# Patient Record
Sex: Male | Born: 1940
Health system: Southern US, Community
[De-identification: ages and names within clinical notes are randomized; demographics above are authoritative.]

## PROBLEM LIST (undated history)

## (undated) DIAGNOSIS — N400 Enlarged prostate without lower urinary tract symptoms: Secondary | ICD-10-CM

## (undated) DIAGNOSIS — M47812 Spondylosis without myelopathy or radiculopathy, cervical region: Secondary | ICD-10-CM

## (undated) DIAGNOSIS — E039 Hypothyroidism, unspecified: Secondary | ICD-10-CM

## (undated) DIAGNOSIS — K219 Gastro-esophageal reflux disease without esophagitis: Secondary | ICD-10-CM

## (undated) DIAGNOSIS — J45909 Unspecified asthma, uncomplicated: Secondary | ICD-10-CM

## (undated) DIAGNOSIS — E785 Hyperlipidemia, unspecified: Secondary | ICD-10-CM

## (undated) DIAGNOSIS — E079 Disorder of thyroid, unspecified: Secondary | ICD-10-CM

## (undated) DIAGNOSIS — Z8489 Family history of other specified conditions: Secondary | ICD-10-CM

## (undated) DIAGNOSIS — C801 Malignant (primary) neoplasm, unspecified: Secondary | ICD-10-CM

## (undated) DIAGNOSIS — N289 Disorder of kidney and ureter, unspecified: Secondary | ICD-10-CM

## (undated) DIAGNOSIS — I4891 Unspecified atrial fibrillation: Secondary | ICD-10-CM

## (undated) DIAGNOSIS — I499 Cardiac arrhythmia, unspecified: Secondary | ICD-10-CM

## (undated) DIAGNOSIS — M8580 Other specified disorders of bone density and structure, unspecified site: Secondary | ICD-10-CM

## (undated) DIAGNOSIS — I10 Essential (primary) hypertension: Secondary | ICD-10-CM

## (undated) DIAGNOSIS — I7300 Raynaud's syndrome without gangrene: Secondary | ICD-10-CM

## (undated) HISTORY — PX: HEMORROIDECTOMY: SUR656

## (undated) HISTORY — DX: Hyperlipidemia, unspecified: E78.5

## (undated) HISTORY — DX: Disorder of thyroid, unspecified: E07.9

## (undated) HISTORY — DX: Other specified disorders of bone density and structure, unspecified site: M85.80

## (undated) HISTORY — DX: Benign prostatic hyperplasia without lower urinary tract symptoms: N40.0

## (undated) HISTORY — DX: Spondylosis without myelopathy or radiculopathy, cervical region: M47.812

## (undated) HISTORY — DX: Disorder of kidney and ureter, unspecified: N28.9

## (undated) HISTORY — DX: Raynaud's syndrome without gangrene: I73.00

## (undated) HISTORY — PX: PROSTATECTOMY: SHX69

---

## 1998-05-14 ENCOUNTER — Ambulatory Visit (HOSPITAL_COMMUNITY): Admission: RE | Admit: 1998-05-14 | Discharge: 1998-05-14 | Payer: Self-pay | Admitting: Internal Medicine

## 1999-03-31 ENCOUNTER — Encounter: Payer: Self-pay | Admitting: Neurology

## 1999-03-31 ENCOUNTER — Ambulatory Visit (HOSPITAL_COMMUNITY): Admission: RE | Admit: 1999-03-31 | Discharge: 1999-03-31 | Payer: Self-pay | Admitting: Neurology

## 2000-01-17 ENCOUNTER — Ambulatory Visit: Admission: RE | Admit: 2000-01-17 | Discharge: 2000-01-17 | Payer: Self-pay | Admitting: Internal Medicine

## 2002-03-03 ENCOUNTER — Ambulatory Visit (HOSPITAL_COMMUNITY): Admission: RE | Admit: 2002-03-03 | Discharge: 2002-03-03 | Payer: Self-pay | Admitting: Gastroenterology

## 2007-01-02 ENCOUNTER — Encounter: Admission: RE | Admit: 2007-01-02 | Discharge: 2007-01-02 | Payer: Self-pay | Admitting: Geriatric Medicine

## 2007-06-06 ENCOUNTER — Encounter (INDEPENDENT_AMBULATORY_CARE_PROVIDER_SITE_OTHER): Payer: Self-pay | Admitting: Geriatric Medicine

## 2007-06-06 ENCOUNTER — Ambulatory Visit (HOSPITAL_COMMUNITY): Admission: RE | Admit: 2007-06-06 | Discharge: 2007-06-06 | Payer: Self-pay | Admitting: Geriatric Medicine

## 2009-10-13 ENCOUNTER — Ambulatory Visit: Payer: Self-pay | Admitting: Vascular Surgery

## 2009-10-15 ENCOUNTER — Encounter: Admission: RE | Admit: 2009-10-15 | Discharge: 2009-10-15 | Payer: Self-pay | Admitting: Vascular Surgery

## 2009-10-20 ENCOUNTER — Ambulatory Visit: Payer: Self-pay | Admitting: Vascular Surgery

## 2010-11-01 NOTE — Assessment & Plan Note (Signed)
OFFICE VISIT   Michael Bates, Michael Bates  DOB:  September 10, 1940                                       10/13/2009  EAVWU#:[id number]   The patient is a 70 year old male referred by Dr. Lestine Box for evaluation  of ulcerations, coolness and duskiness of toes.  He has had multiple  infections of his left first and second toe.  He also had recent  infections in the right first and second toe.  These have all occurred  in the last 18 months.  He states that over the last 6 months his toes  have been dusky when cold.  He denies any sensation of pins and needles.  He denies any claudication symptoms.  He denies any rest pain.  He has  no significant atherosclerotic risk factors.  He is fairly active  overall.  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   PAST MEDICAL HISTORY:  Is unremarkable.   FAMILY HISTORY:  Is unremarkable.   SOCIAL HISTORY:  He is married, has three children.  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  He is a former smoker but quit in 1978.  He drinks  approximately 1-2 glasses of wine per day.   REVIEW OF SYSTEMS:  Full 12 point review of systems was performed with  the patient today.  Please see intake referral form for details  regarding this.   PHYSICAL EXAM:  Vital signs:  Blood pressure is 127/74 in the right arm,  heart rate is 56 and regular.  Oxygen saturation is 100% on room air.  HEENT:  Unremarkable.  Neck:  Has 2+ carotid pulses without bruit.  Chest:  Clear to auscultation.  Cardiac:  Exam is regular rate and  rhythm without murmur.  Abdomen:  Soft, nontender, nondistended.  No  masses.  Extremities:  He has no significant major joint deformities.  He has 2+ femoral, 1+ popliteal and 2+ dorsalis pedis and posterior  tibial pulses bilaterally.  Neurologic:  Exam shows symmetric upper  extremity and lower extremity motor strength which is 5/5 and symmetric.  Skin:  Has an area of ulceration on the right third toe and on the left  first toe which  is healing.  He has overlap of the second and third toes  on the left foot.  There is no surrounding erythema.  There is no  discharge.   He had bilateral ABIs performed today which were 1.26 on the right, 1.22  on the left.  He had triphasic waveforms which were normal bilaterally.   IMPRESSION:  Most likely this represents a Raynaud's type phenomena in  his feet.  He has palpable pulses in both feet and normal ABIs.  He has  a normal pulse exam and normal ABIs.  However, because of the duskiness  and ulcerations of his toes before confining all of this to a Raynaud's  type phenomena I believe he would benefit from a CT angio of the chest,  abdomen and pelvis to rule out atheroemboli as a source.  He will return  for followup next week to review the findings of his CT scan of the  chest, abdomen and pelvis.  If this has no significant findings then I  believe that most likely this is related to a Raynaud's type phenomena  and we would treat this conservatively with warming of his digits and  protection of his digits with some consideration to a calcium channel  blocker if his symptoms become worse over time.     Janetta Hora. Fields, MD  Electronically Signed   CEF/MEDQ  D:  10/20/2009  T:  10/20/2009  Job:  [id number]   cc:   Leonides Grills, M.D.  Hal T. Stoneking, M.D.

## 2010-11-01 NOTE — Assessment & Plan Note (Signed)
OFFICE VISIT   TARIG, ZIMMERS  DOB:  09-22-1940                                       10/20/2009  EAVWU#:[id number]   Patient returns for follow-up today.  He still has some duskiness of his  left third toe but no ulcerations are open on his feet currently.  He  had a CT angiogram of the chest, abdomen and pelvis last week.  This  showed no obvious source for atheroemboli throughout his arterial tree.  I believe at this point that most likely his symptoms are a Raynaud's-  type phenomenon.   I discussed with him today conservative management in keeping his feet  protected and also keeping them warm and out of cold environments.  He  states that the toe on the left foot has been chronically dusky and  never really improved to normal.  It is certainly possible that he could  have had some digital atheroembolic event at some point, but we do not  really have a source for this at this time.  I believe the best  management again is conservative in nature.  He is currently on aspirin  therapy.  I encouraged him to continue this.  I also encouraged him to  continue to be active overall.  If he has any worsening of his symptoms,  would be happy to see him back at any time.  Otherwise he will follow up  on an as-needed basis.     Janetta Hora. Fields, MD  Electronically Signed   CEF/MEDQ  D:  10/20/2009  T:  10/20/2009  Job:  [id number]   cc:   Leonides Grills, M.D.  Hal T. Stoneking, M.D.

## 2011-09-08 ENCOUNTER — Other Ambulatory Visit: Payer: Self-pay | Admitting: Geriatric Medicine

## 2011-09-08 DIAGNOSIS — R1314 Dysphagia, pharyngoesophageal phase: Secondary | ICD-10-CM

## 2011-09-14 ENCOUNTER — Ambulatory Visit
Admission: RE | Admit: 2011-09-14 | Discharge: 2011-09-14 | Disposition: A | Discharge status: Home / Self Care | Payer: BC Managed Care – PPO | Source: Ambulatory Visit | Attending: Geriatric Medicine | Admitting: Geriatric Medicine

## 2011-09-14 DIAGNOSIS — R1314 Dysphagia, pharyngoesophageal phase: Secondary | ICD-10-CM

## 2011-11-22 DIAGNOSIS — N400 Enlarged prostate without lower urinary tract symptoms: Secondary | ICD-10-CM | POA: Diagnosis present

## 2014-09-22 ENCOUNTER — Ambulatory Visit
Admission: RE | Admit: 2014-09-22 | Discharge: 2014-09-22 | Disposition: A | Discharge status: Home / Self Care | Payer: PRIVATE HEALTH INSURANCE | Source: Ambulatory Visit | Attending: Geriatric Medicine | Admitting: Geriatric Medicine

## 2014-09-22 ENCOUNTER — Other Ambulatory Visit: Payer: Self-pay | Admitting: Geriatric Medicine

## 2014-09-22 DIAGNOSIS — R042 Hemoptysis: Secondary | ICD-10-CM

## 2014-12-24 ENCOUNTER — Other Ambulatory Visit: Payer: Self-pay | Admitting: Otolaryngology

## 2014-12-24 DIAGNOSIS — D490 Neoplasm of unspecified behavior of digestive system: Secondary | ICD-10-CM

## 2014-12-24 DIAGNOSIS — R220 Localized swelling, mass and lump, head: Secondary | ICD-10-CM

## 2014-12-29 ENCOUNTER — Other Ambulatory Visit: Payer: PRIVATE HEALTH INSURANCE

## 2014-12-31 ENCOUNTER — Ambulatory Visit
Admission: RE | Admit: 2014-12-31 | Discharge: 2014-12-31 | Disposition: A | Discharge status: Home / Self Care | Payer: PRIVATE HEALTH INSURANCE | Source: Ambulatory Visit | Attending: Otolaryngology | Admitting: Otolaryngology

## 2014-12-31 ENCOUNTER — Other Ambulatory Visit: Payer: PRIVATE HEALTH INSURANCE

## 2014-12-31 DIAGNOSIS — D490 Neoplasm of unspecified behavior of digestive system: Secondary | ICD-10-CM

## 2014-12-31 DIAGNOSIS — R220 Localized swelling, mass and lump, head: Secondary | ICD-10-CM

## 2014-12-31 MED ORDER — IOPAMIDOL (ISOVUE-300) INJECTION 61%
80.0000 mL | Freq: Once | INTRAVENOUS | Status: AC | PRN
Start: 2014-12-31 — End: 2014-12-31
  Administered 2014-12-31: 80 mL via INTRAVENOUS

## 2015-06-20 HISTORY — PX: PAROTIDECTOMY: SUR1003

## 2015-08-27 ENCOUNTER — Encounter: Payer: PRIVATE HEALTH INSURANCE | Admitting: Internal Medicine

## 2015-09-03 ENCOUNTER — Ambulatory Visit (INDEPENDENT_AMBULATORY_CARE_PROVIDER_SITE_OTHER): Payer: Medicare Other | Admitting: Internal Medicine

## 2015-09-03 DIAGNOSIS — Z7189 Other specified counseling: Secondary | ICD-10-CM

## 2015-09-03 DIAGNOSIS — IMO0002 Reserved for concepts with insufficient information to code with codable children: Secondary | ICD-10-CM

## 2015-09-03 DIAGNOSIS — Z23 Encounter for immunization: Secondary | ICD-10-CM | POA: Diagnosis not present

## 2015-09-03 DIAGNOSIS — Z789 Other specified health status: Secondary | ICD-10-CM

## 2015-09-03 DIAGNOSIS — Z9189 Other specified personal risk factors, not elsewhere classified: Secondary | ICD-10-CM | POA: Diagnosis not present

## 2015-09-03 NOTE — Progress Notes (Signed)
  RFV: upcoming trip to [place], [place] and [place] Subjective:    Patient ID: Michael Bates, male    DOB: 03/27/1941, 75 y.o.   MRN: [id number]  HPI 75yo M will be going to [place] with his wife for up 22 day trip including 6 days in [place] then remaining part will be in the [place]  Recent vaccine = he had flu vaccine, hep A #1 in August, oral typhoid  Allergies: NKMA  Current Outpatient Prescriptions on File Prior to Visit  Medication Sig Dispense Refill  . albuterol (PROVENTIL HFA;VENTOLIN HFA) 108 (90 BASE) MCG/ACT inhaler Inhale into the lungs every 6 (six) hours as needed for wheezing or shortness of breath.    Marland Kitchen aspirin 81 MG tablet Take 81 mg by mouth daily.    . beclomethasone (QVAR) 40 MCG/ACT inhaler Inhale into the lungs 2 (two) times daily.    Marland Kitchen buPROPion (WELLBUTRIN) 100 MG tablet Take 300 mg by mouth 2 (two) times daily.    . Coenzyme Q-10 100 MG capsule Take 100 mg by mouth daily.    . Ergocalciferol (VITAMIN D2) 2000 UNITS TABS Take by mouth.    . Glucos-Chondroit-Collag-Hyal (GLUCOSAMINE CHONDROIT-COLLAGEN PO) Take by mouth.    . levothyroxine (SYNTHROID, LEVOTHROID) 100 MCG tablet Take 100 mcg by mouth daily before breakfast.    . mirabegron ER (MYRBETRIQ) 50 MG TB24 tablet Take 50 mg by mouth daily.    . Misc Natural Products (LUTEIN 20) CAPS Take by mouth.    . Multiple Vitamins-Minerals (CENTRUM ULTRA MENS PO) Take by mouth.    . Omega-3 Fatty Acids (FISH OIL) 1000 MG CAPS Take by mouth.    . tadalafil (CIALIS) 5 MG tablet Take 5 mg by mouth daily as needed for erectile dysfunction.    . TURMERIC CURCUMIN PO Take by mouth.    . vitamin C (ASCORBIC ACID) 500 MG tablet Take 500 mg by mouth daily.     No current facility-administered medications on file prior to visit.   Active Ambulatory Problems    Diagnosis Date Noted  . No Active Ambulatory Problems   Resolved Ambulatory Problems    Diagnosis Date Noted  . No Resolved Ambulatory Problems   Past  Medical History  Diagnosis Date  . Thyroid disease   . Hyperlipidemia   . Degenerative arthritis of cervical spine   . Raynaud disease   . BPH (benign prostatic hyperplasia)   . Osteopenia   . Renal disease      Review of Systems Past travel: [place], [place], [place], [place], [place], [place], [place], [place], [place], [place], [place], [place], [place], [place] , [place], [place], [place], [place]    Objective:   Physical Exam        Assessment & Plan:  Pre travel vaccinatiosn = will give rx for yellow fever, hep A #2, and TDAP  Mosquito bite prevention = gave precautions, and how to take malarone, given by his pcp  Traveler's diarrhea = gave precautions and how to take cipro if needed  Altitude sickness =will give rx for diamox plus dexamethasone to use if symptomatic

## 2015-09-14 NOTE — Addendum Note (Signed)
Addended by: Landis Gandy on: 09/14/2015 10:39 AM   Modules accepted: Orders

## 2016-01-07 ENCOUNTER — Other Ambulatory Visit: Payer: Self-pay | Admitting: Gastroenterology

## 2016-01-10 ENCOUNTER — Ambulatory Visit (HOSPITAL_COMMUNITY): Payer: Medicare Other | Admitting: Anesthesiology

## 2016-01-10 ENCOUNTER — Encounter (HOSPITAL_COMMUNITY): Payer: Self-pay

## 2016-01-10 ENCOUNTER — Ambulatory Visit (HOSPITAL_COMMUNITY)
Admission: RE | Admit: 2016-01-10 | Discharge: 2016-01-10 | Disposition: A | Discharge status: Home / Self Care | Payer: Medicare Other | Source: Ambulatory Visit | Attending: Gastroenterology | Admitting: Gastroenterology

## 2016-01-10 ENCOUNTER — Encounter (HOSPITAL_COMMUNITY)
Admission: RE | Disposition: A | Discharge status: Home / Self Care | Payer: Self-pay | Source: Ambulatory Visit | Attending: Gastroenterology

## 2016-01-10 DIAGNOSIS — Z79899 Other long term (current) drug therapy: Secondary | ICD-10-CM | POA: Insufficient documentation

## 2016-01-10 DIAGNOSIS — Z87891 Personal history of nicotine dependence: Secondary | ICD-10-CM | POA: Insufficient documentation

## 2016-01-10 DIAGNOSIS — I7300 Raynaud's syndrome without gangrene: Secondary | ICD-10-CM | POA: Diagnosis not present

## 2016-01-10 DIAGNOSIS — Z1211 Encounter for screening for malignant neoplasm of colon: Secondary | ICD-10-CM | POA: Diagnosis not present

## 2016-01-10 DIAGNOSIS — E039 Hypothyroidism, unspecified: Secondary | ICD-10-CM | POA: Insufficient documentation

## 2016-01-10 DIAGNOSIS — E222 Syndrome of inappropriate secretion of antidiuretic hormone: Secondary | ICD-10-CM | POA: Diagnosis not present

## 2016-01-10 DIAGNOSIS — J45990 Exercise induced bronchospasm: Secondary | ICD-10-CM | POA: Insufficient documentation

## 2016-01-10 DIAGNOSIS — Z7982 Long term (current) use of aspirin: Secondary | ICD-10-CM | POA: Diagnosis not present

## 2016-01-10 HISTORY — PX: COLONOSCOPY WITH PROPOFOL: SHX5780

## 2016-01-10 HISTORY — DX: Family history of other specified conditions: Z84.89

## 2016-01-10 SURGERY — COLONOSCOPY WITH PROPOFOL
Anesthesia: Monitor Anesthesia Care

## 2016-01-10 MED ORDER — LACTATED RINGERS IV SOLN
INTRAVENOUS | Status: DC | PRN
  Administered 2016-01-10: 10:00:00 via INTRAVENOUS

## 2016-01-10 MED ORDER — SODIUM CHLORIDE 0.9 % IV SOLN: INTRAVENOUS | Status: DC

## 2016-01-10 MED ORDER — PROPOFOL 10 MG/ML IV BOLUS
INTRAVENOUS | Status: AC
  Filled 2016-01-10: qty 40

## 2016-01-10 MED ORDER — LACTATED RINGERS IV SOLN: INTRAVENOUS | Status: DC

## 2016-01-10 MED ORDER — PROPOFOL 500 MG/50ML IV EMUL
INTRAVENOUS | Status: DC | PRN
  Administered 2016-01-10: 250 ug/kg/min via INTRAVENOUS

## 2016-01-10 SURGICAL SUPPLY — 21 items

## 2016-01-10 NOTE — Op Note (Signed)
[facility] Patient Name: Michael Bates Procedure Date: 01/10/2016 MRN: [id number] Attending MD: Garlan Fair , MD Date of Birth: 1940/11/23 CSN: [id number] Age: 75 Admit Type: Outpatient Procedure:                Colonoscopy Indications:              Screening for colorectal malignant neoplasm Providers:                Garlan Fair, MD, Hilma Favors, RN, Alfonso Patten, Technician, Enrigue Catena, CRNA Referring MD:              Medicines:                Propofol per Anesthesia Complications:            No immediate complications. Estimated Blood Loss:     Estimated blood loss: none. Procedure:                Pre-Anesthesia Assessment:                           - Prior to the procedure, a History and Physical                            was performed, and patient medications and                            allergies were reviewed. The patient's tolerance of                            previous anesthesia was also reviewed. The risks                            and benefits of the procedure and the sedation                            options and risks were discussed with the patient.                            All questions were answered, and informed consent                            was obtained. Prior Anticoagulants: The patient has                            taken aspirin, last dose was 1 day prior to                            procedure. ASA Grade Assessment: II - A patient                            with mild systemic disease. After reviewing the  risks and benefits, the patient was deemed in                            satisfactory condition to undergo the procedure.                           After obtaining informed consent, the colonoscope                            was passed under direct vision. Throughout the                            procedure, the patient's blood pressure, pulse, and            oxygen saturations were monitored continuously. The                            [id number] scope was introduced through                            the anus and advanced to the the cecum, identified                            by appendiceal orifice and ileocecal valve. The                            colonoscopy was performed without difficulty. The                            patient tolerated the procedure well. The quality                            of the bowel preparation was good. The appendiceal                            orifice and the rectum were photographed. Scope In: 10:21:18 AM Scope Out: 10:39:50 AM Scope Withdrawal Time: 0 hours 7 minutes 57 seconds  Total Procedure Duration: 0 hours 18 minutes 32 seconds  Findings:      The perianal and digital rectal examinations were normal.      The entire examined colon appeared normal. Impression:               - The entire examined colon is normal.                           - No specimens collected. Moderate Sedation:      N/A- Per Anesthesia Care Recommendation:           - Patient has a contact number available for                            emergencies. The signs and symptoms of potential                            delayed complications were discussed with the  patient. Return to normal activities tomorrow.                            Written discharge instructions were provided to the                            patient.                           - Repeat colonoscopy is not recommended for                            screening purposes.                           - Resume previous diet.                           - Continue present medications. Procedure Code(s):        --- Professional ---                           [phone], Colonoscopy, flexible; diagnostic, including                            collection of specimen(s) by brushing or washing,                            when performed  (separate procedure) Diagnosis Code(s):        --- Professional ---                           Z12.11, Encounter for screening for malignant                            neoplasm of colon CPT copyright 2016 American Medical Association. All rights reserved. The codes documented in this report are preliminary and upon coder review may  be revised to meet current compliance requirements. Earle Gell, MD Garlan Fair, MD 01/10/2016 10:48:12 AM This report has been signed electronically. Number of Addenda: 0

## 2016-01-10 NOTE — Anesthesia Preprocedure Evaluation (Addendum)
Anesthesia Evaluation  Patient identified by MRN, date of birth, ID band Patient awake    Reviewed: Allergy & Precautions, H&P , NPO status , Patient's Chart, lab work & pertinent test results  Airway Mallampati: III  TM Distance: >3 FB Neck ROM: Full    Dental no notable dental hx. (+) Teeth Intact, Dental Advisory Given   Pulmonary neg pulmonary ROS, former smoker,    Pulmonary exam normal breath sounds clear to auscultation       Cardiovascular negative cardio ROS   Rhythm:Regular Rate:Normal     Neuro/Psych negative neurological ROS  negative psych ROS   GI/Hepatic negative GI ROS, Neg liver ROS,   Endo/Other  Hypothyroidism   Renal/GU Renal InsufficiencyRenal disease  negative genitourinary   Musculoskeletal  (+) Arthritis , Osteoarthritis,    Abdominal   Peds  Hematology negative hematology ROS (+)   Anesthesia Other Findings   Reproductive/Obstetrics negative OB ROS                            Anesthesia Physical Anesthesia Plan  ASA: II  Anesthesia Plan: MAC   Post-op Pain Management:    Induction: Intravenous  Airway Management Planned: Simple Face Mask  Additional Equipment:   Intra-op Plan:   Post-operative Plan:   Informed Consent: I have reviewed the patients History and Physical, chart, labs and discussed the procedure including the risks, benefits and alternatives for the proposed anesthesia with the patient or authorized representative who has indicated his/her understanding and acceptance.   Dental advisory given  Plan Discussed with: CRNA  Anesthesia Plan Comments:         Anesthesia Quick Evaluation

## 2016-01-10 NOTE — Transfer of Care (Signed)
Immediate Anesthesia Transfer of Care Note  Patient: Michael Bates  Procedure(s) Performed: Procedure(s): COLONOSCOPY WITH PROPOFOL (N/A)  Patient Location: PACU  Anesthesia Type:MAC  Level of Consciousness: awake, alert , oriented and patient cooperative  Airway & Oxygen Therapy: Patient Spontanous Breathing and Patient connected to face mask oxygen  Post-op Assessment: Report given to RN, Post -op Vital signs reviewed and stable and Patient moving all extremities X 4  Post vital signs: stable  Last Vitals:  Vitals:   01/10/16 0948  BP: (!) 153/70  Pulse: (!) 53  Resp: 12  Temp: 36.9 C    Last Pain:  Vitals:   01/10/16 0948  TempSrc: Oral         Complications: No apparent anesthesia complications

## 2016-01-10 NOTE — Anesthesia Postprocedure Evaluation (Signed)
Anesthesia Post Note  Patient: Michael Bates  Procedure(s) Performed: Procedure(s) (LRB): COLONOSCOPY WITH PROPOFOL (N/A)  Patient location during evaluation: PACU Anesthesia Type: MAC Level of consciousness: awake and alert Pain management: pain level controlled Vital Signs Assessment: post-procedure vital signs reviewed and stable Respiratory status: spontaneous breathing, nonlabored ventilation and respiratory function stable Cardiovascular status: stable and blood pressure returned to baseline Anesthetic complications: no    Last Vitals:  Vitals:   01/10/16 1110 01/10/16 1120  BP: 139/61 (!) 167/76  Pulse: (!) 55 (!) 51  Resp: 14 17  Temp:      Last Pain:  Vitals:   01/10/16 1050  TempSrc: Oral                 Virjean Boman,W. EDMOND

## 2016-01-10 NOTE — H&P (Signed)
  Procedure: Screening colonoscopy. Normal screening colonoscopy was performed on 04/30/2006  History: The patient is a 76 year old male born April 02, 1941. He is scheduled to undergo a repeat screening colonoscopy today.  Past medical history: Hemorrhoid surgery. TURP. Hypothyroidism. Hypercholesterolemia. Cervical spine facet arthritis. Mild exercise-induced asthma. Raynaud's syndrome. Benign prostatic hypertrophy. SIADH.  Medication allergies: None  Exam: The patient is alert and lying comfortably on the endoscopy stretcher. Abdomen is soft and nontender to palpation. Lungs are clear to auscultation. Cardiac exam reveals a regular rhythm.  Plan: Proceed with screening colonoscopy

## 2016-01-10 NOTE — Discharge Instructions (Signed)

## 2016-01-11 ENCOUNTER — Encounter (HOSPITAL_COMMUNITY): Payer: Self-pay | Admitting: Gastroenterology

## 2016-04-06 ENCOUNTER — Encounter (HOSPITAL_COMMUNITY): Payer: Self-pay

## 2016-07-12 ENCOUNTER — Other Ambulatory Visit: Payer: Self-pay | Admitting: Otolaryngology

## 2016-07-12 DIAGNOSIS — C07 Malignant neoplasm of parotid gland: Secondary | ICD-10-CM

## 2016-07-17 ENCOUNTER — Ambulatory Visit
Admission: RE | Admit: 2016-07-17 | Discharge: 2016-07-17 | Disposition: A | Discharge status: Home / Self Care | Payer: Medicare Other | Source: Ambulatory Visit | Attending: Otolaryngology | Admitting: Otolaryngology

## 2016-07-17 DIAGNOSIS — C07 Malignant neoplasm of parotid gland: Secondary | ICD-10-CM

## 2016-07-17 MED ORDER — IOPAMIDOL (ISOVUE-300) INJECTION 61%
75.0000 mL | Freq: Once | INTRAVENOUS | Status: AC | PRN
  Administered 2016-07-17: 75 mL via INTRAVENOUS

## 2016-10-23 ENCOUNTER — Other Ambulatory Visit (HOSPITAL_COMMUNITY): Payer: Self-pay | Admitting: Internal Medicine

## 2016-10-23 ENCOUNTER — Other Ambulatory Visit: Payer: Self-pay | Admitting: Internal Medicine

## 2016-10-23 DIAGNOSIS — R609 Edema, unspecified: Secondary | ICD-10-CM

## 2016-10-23 DIAGNOSIS — M79605 Pain in left leg: Secondary | ICD-10-CM

## 2016-10-23 DIAGNOSIS — M7989 Other specified soft tissue disorders: Principal | ICD-10-CM

## 2016-10-24 ENCOUNTER — Ambulatory Visit (HOSPITAL_COMMUNITY)
Admission: RE | Admit: 2016-10-24 | Discharge: 2016-10-24 | Disposition: A | Discharge status: Home / Self Care | Payer: Medicare Other | Source: Ambulatory Visit | Attending: Geriatric Medicine | Admitting: Geriatric Medicine

## 2016-10-24 DIAGNOSIS — R2240 Localized swelling, mass and lump, unspecified lower limb: Secondary | ICD-10-CM | POA: Insufficient documentation

## 2016-10-24 DIAGNOSIS — M79605 Pain in left leg: Secondary | ICD-10-CM

## 2016-10-24 DIAGNOSIS — M7989 Other specified soft tissue disorders: Secondary | ICD-10-CM

## 2016-10-24 NOTE — Progress Notes (Signed)
*  Preliminary Results* Left lower extremity venous duplex completed. Left lower extremity is negative for deep vein thrombosis. There is no evidence of left Baker's cyst.  Incidental finding: there is a hypoechoic area in the left medial ankle measuring . This does not appear to be a thrombosed vein, and is suggestive of a possible hematoma versus unknown etiology.  Preliminary results discussed with [place] of Dr. Carlyle Lipa office.  10/24/2016 9:31 AM  Maudry Mayhew, BS, RVT, RDCS, RDMS

## 2017-01-16 ENCOUNTER — Other Ambulatory Visit: Payer: Self-pay | Admitting: Otolaryngology

## 2017-01-16 DIAGNOSIS — Z85818 Personal history of malignant neoplasm of other sites of lip, oral cavity, and pharynx: Secondary | ICD-10-CM

## 2017-01-17 ENCOUNTER — Other Ambulatory Visit: Payer: Self-pay | Admitting: Otolaryngology

## 2017-01-17 DIAGNOSIS — Z85818 Personal history of malignant neoplasm of other sites of lip, oral cavity, and pharynx: Secondary | ICD-10-CM

## 2017-01-30 ENCOUNTER — Ambulatory Visit
Admission: RE | Admit: 2017-01-30 | Discharge: 2017-01-30 | Disposition: A | Discharge status: Home / Self Care | Payer: Medicare Other | Source: Ambulatory Visit | Attending: Otolaryngology | Admitting: Otolaryngology

## 2017-01-30 DIAGNOSIS — Z85818 Personal history of malignant neoplasm of other sites of lip, oral cavity, and pharynx: Secondary | ICD-10-CM

## 2017-01-30 MED ORDER — GADOBENATE DIMEGLUMINE 529 MG/ML IV SOLN
15.0000 mL | Freq: Once | INTRAVENOUS | Status: AC | PRN
  Administered 2017-01-30: 15 mL via INTRAVENOUS

## 2017-11-10 ENCOUNTER — Encounter (HOSPITAL_COMMUNITY): Payer: Self-pay | Admitting: Nurse Practitioner

## 2017-11-10 ENCOUNTER — Emergency Department (HOSPITAL_COMMUNITY)
Admission: EM | Admit: 2017-11-10 | Discharge: 2017-11-10 | Disposition: A | Discharge status: Home / Self Care | Payer: Medicare Other | Attending: Physician Assistant | Admitting: Physician Assistant

## 2017-11-10 DIAGNOSIS — E039 Hypothyroidism, unspecified: Secondary | ICD-10-CM | POA: Diagnosis not present

## 2017-11-10 DIAGNOSIS — G500 Trigeminal neuralgia: Secondary | ICD-10-CM

## 2017-11-10 DIAGNOSIS — N183 Chronic kidney disease, stage 3 (moderate): Secondary | ICD-10-CM | POA: Insufficient documentation

## 2017-11-10 DIAGNOSIS — Z87891 Personal history of nicotine dependence: Secondary | ICD-10-CM | POA: Diagnosis not present

## 2017-11-10 DIAGNOSIS — Z7982 Long term (current) use of aspirin: Secondary | ICD-10-CM | POA: Diagnosis not present

## 2017-11-10 DIAGNOSIS — Z79899 Other long term (current) drug therapy: Secondary | ICD-10-CM | POA: Insufficient documentation

## 2017-11-10 DIAGNOSIS — R51 Headache: Secondary | ICD-10-CM | POA: Diagnosis present

## 2017-11-10 MED ORDER — GABAPENTIN 300 MG PO CAPS: 300.0000 mg | ORAL_CAPSULE | Freq: Every day | ORAL | 0 refills | Status: DC

## 2017-11-10 MED ORDER — OXYCODONE-ACETAMINOPHEN 5-325 MG PO TABS: 1.0000 | ORAL_TABLET | Freq: Four times a day (QID) | ORAL | 0 refills | Status: DC | PRN

## 2017-11-10 NOTE — ED Provider Notes (Signed)
[facility] DEPT Provider Note   CSN: [id number] Arrival date & time: 11/10/17  1652     History   Chief Complaint Chief Complaint  Patient presents with  . Facial Pain    HPI Michael Bates is a 77 y.o. male.  HPI   Very pleasant 77 year old male presenting with right-sided facial pain.  Patient reports that it comes and goes.  He reports is aching all the time at all, and hot flashes.  Patient is well educated and believes that this might be trigeminal neuralgia.  He is seeing his primary care physician for this earlier this week and was referred to Dr. Redmond Baseman.  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  Of note patient also had recent shaving down the cuspids for pain in the right side as well.  Patient had no other fever, weakness, or ear fullness.   Past Medical History:  Diagnosis Date  . BPH (benign prostatic hyperplasia)   . Degenerative arthritis of cervical spine    c5-c6  . Family history of adverse reaction to anesthesia    Father had idosyncratic nartic reaction  . Hyperlipidemia   . Osteopenia   . Raynaud disease   . Renal disease    stage 3  . Thyroid disease     There are no active problems to display for this patient.   Past Surgical History:  Procedure Laterality Date  . COLONOSCOPY WITH PROPOFOL N/A 01/10/2016   Procedure: COLONOSCOPY WITH PROPOFOL;  Surgeon: Garlan Fair, MD;  Location: [facility] END[facility]COPY;  Service: Endoscopy;  Laterality: N/A;  . HEMORROIDECTOMY    . PROSTATECTOMY          Home Medications    Prior to Admission medications   Medication Sig Start Date End Date Taking? Authorizing Provider  albuterol (PROVENTIL HFA;VENTOLIN HFA) 108 (90 BASE) MCG/ACT inhaler Inhale 2 puffs into the lungs every 6 (six) hours as needed for wheezing or shortness of breath.     [provider]  aspirin 81 MG tablet Take 81 mg by mouth daily.     [provider]  beclomethasone (QVAR) 40 MCG/ACT inhaler Inhale into the lungs 2 (two) times daily.    [provider]  buPROPion (WELLBUTRIN XL) 300 MG 24 hr tablet Take 300 mg by mouth daily. 11/30/15   [provider]  Cholecalciferol (VITAMIN D3) 5000 units CAPS Take 5,000 Units by mouth daily.    [provider]  Coenzyme Q-10 100 MG capsule Take 100 mg by mouth daily.    [provider]  gabapentin (NEURONTIN) 300 MG capsule Take 1 capsule (300 mg total) by mouth daily for 15 doses. 11/10/17 11/25/17  Mackuen, Courteney Lyn, MD  Glucos-Chondroit-Collag-Hyal (GLUCOSAMINE CHONDROIT-COLLAGEN PO) Take 1 tablet by mouth daily.     [provider]  levothyroxine (SYNTHROID, LEVOTHROID) 200 MCG tablet Take 200 mcg by mouth every morning. 12/30/15   [provider]  methylphenidate 18 MG PO CR tablet Take 18 mg by mouth daily. 12/29/15   [provider]  mirabegron ER (MYRBETRIQ) 50 MG TB24 tablet Take 50 mg by mouth daily.    [provider]  Misc Natural Products (LUTEIN 20) CAPS Take 1 capsule by mouth daily.     [provider]  Multiple Vitamins-Minerals (CENTRUM ULTRA MENS PO) Take 1 tablet by mouth daily.     [provider]  Omega-3 Fatty Acids ([place]  OIL) 1000 MG CAPS Take by mouth.    [provider]  oxyCODONE-acetaminophen (PERCOCET/ROXICET) 5-325 MG tablet Take 1 tablet by mouth every 6 (six) hours as needed for severe pain. 11/10/17   Mackuen, Courteney Lyn, MD  tadalafil (CIALIS) 5 MG tablet Take 5 mg by mouth daily.     [provider]  TURMERIC CURCUMIN PO Take 1 capsule by mouth 2 (two) times daily.     [provider]  vitamin C (ASCORBIC ACID) 500 MG tablet Take 500 mg by mouth daily.    [provider]    Family History History reviewed. No pertinent family history.  Social History Social History   Tobacco Use  . Smoking status: Former Smoker      Packs/day: 1.00    Years: 20.00    Pack years: 20.00    Types: Cigarettes, Pipe    Last attempt to quit: 1979    Years since quitting: 40.4  . Smokeless tobacco: Never Used  Substance Use Topics  . Alcohol use: Yes    Alcohol/week: 4.2 oz    Types: 7 Glasses of wine per week  . Drug use: No     Allergies   Tape   Review of Systems Review of Systems  HENT: Positive for sinus pain. Negative for ear pain, facial swelling, mouth sores, nosebleeds, postnasal drip, rhinorrhea, sinus pressure, sore throat, tinnitus and trouble swallowing.   All other systems reviewed and are negative.    Physical Exam Updated Vital Signs BP (!) 182/84 (BP Location: Left Arm)   Pulse (!) 58   Temp 97.8 F (36.6 C) (Oral)   Resp 18   SpO2 99%   Physical Exam  Constitutional: He is oriented to person, place, and time. He appears well-nourished.  HENT:  Head: Normocephalic.  Eyes: Pupils are equal, round, and reactive to light. Conjunctivae and EOM are normal. Right eye exhibits no discharge. Left eye exhibits no discharge.  Bilateral TMs with no erythema.  No evidence of intraoral infection.  No pain elicited when touching over facial nerve, parotid.  No swelling noted.  Neck: Normal range of motion. Neck supple. No tracheal deviation present. No thyromegaly present.  Cardiovascular: Normal rate, regular rhythm and normal heart sounds.  Pulmonary/Chest: Effort normal and breath sounds normal. No respiratory distress.  Neurological: He is oriented to person, place, and time.  Skin: Skin is warm and dry. He is not diaphoretic.  Psychiatric: He has a normal mood and affect. His behavior is normal.     ED Treatments / Results  Labs (all labs ordered are listed, but only abnormal results are displayed) Labs Reviewed - No data to display  EKG None  Radiology No results found.  Procedures Procedures (including critical care time)  Medications Ordered in ED Medications - No data to  display   Initial Impression / Assessment and Plan / ED Course  I have reviewed the triage vital signs and the nursing notes.  Pertinent labs & imaging results that were available during my care of the patient were reviewed by me and considered in my medical decision making (see chart for details).    Very pleasant 77 year old male presenting with right-sided facial pain.  Patient reports that it comes and goes.  He reports is aching all the time at all, and hot flashes.  Patient is well educated and believes that this might be trigeminal neuralgia.  He is seeing his primary care physician for this earlier this week and was referred  to Dr. Redmond Baseman.  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  Of note patient also had recent shaving down the cuspids for pain in the right side as well.  Patient had no other fever, weakness, or ear fullness.  7:00 PM Differential  includes TMJ, neuropathic pain, and trigeminal neuralgia, as well as recurrence of tumor.  Discussed with Constance Holster, he recommends initially medication for pain, and follow-up with Redmond Baseman.  Patient has a follow-up appointment within 4 to 5 days.  Rosen recommended deferring imaging until follow-up appointment.  Patient's questions answered, and will initiate this treatment currently  Patient warned that gabapentin is not the best treat,emt fopr Trigeminal Neuralgia but Will Start for Neuropathic Pain in the meantime.  Patient will likely have to switch to Tegretol if he decides that this is indeed trigeminal neuralgia. Would not start in ED due to significant side effect progfile.   Final Clinical Impressions(s) / ED Diagnoses   Final diagnoses:  Trigeminal neuralgia    ED Discharge Orders        Ordered    gabapentin (NEURONTIN) 300 MG capsule  Daily     11/10/17 1853    oxyCODONE-acetaminophen (PERCOCET/ROXICET) 5-325 MG tablet  Every 6 hours PRN     11/10/17 1853        Macarthur Critchley, MD 11/10/17 1905

## 2017-11-10 NOTE — Discharge Instructions (Signed)
We are unsure what is causing your pain.  We think it could be trigeminal neuralgia, however it could be neuropathic pain due to the remote surgery in the parotid area or TMJ.  We are going to start you on a low-dose of gabapentin (medication often used for neuropathic pain) as we discussed. Your appointment next Thursday with  Redmond Baseman you may choose to change the medication to Tegretol which is more typical medication for trigeminal neuralgia.  They might also decide to do advanced imaging at that time.  In our discussions with them on the phone, we will not do advanced imaging here in the emergency department but rather defer until next week if he feels warranted.  Please return if any concerns arise such as increasing pain, numbness, or weakness. It was a pleasure taking care of you today.

## 2017-11-10 NOTE — ED Triage Notes (Signed)
Pt states he suspects he has trigeminal neuralgia, adding that he has been experiencing right sided sharp facial pain (zygomatic-mandibular aspects) that is no longer being relieved by OTC pain medications. Also states he had a tumor removed 3 years ago.

## 2019-02-18 ENCOUNTER — Encounter: Payer: Self-pay | Admitting: *Deleted

## 2019-02-18 ENCOUNTER — Telehealth: Payer: Self-pay | Admitting: Radiation Oncology

## 2019-02-18 NOTE — Telephone Encounter (Signed)
New message: ° ° °LVM for patient to return call to schedule appt from referral received. °

## 2019-02-20 NOTE — Progress Notes (Signed)
Histology and Location of Primary Skin Cancer:  01/21/19  02/12/19  Michael Bates presented with the following signs/symptoms: He reported irritation, scale formation which had changed in size over the past 8 months.  Past/Anticipated interventions by patient's surgeon/dermatologist for current problematic lesion, if any:  Mohs surgery performed on 02/12/19 by Karin Golden MD.  Past skin cancers, if any: Yes, multiple Right ear X 2 basal cell Nose Left cheek Right and left forehead, basal cell Left ear X 2 Posterior neck Left forearm Chest.   History of Blistering sunburns, if any: Yes.   SAFETY ISSUES:  Prior radiation? No   Pacemaker/ICD? No  Possible current pregnancy? No  Is the patient on methotrexate? No  Current Complaints / other details:

## 2019-02-26 ENCOUNTER — Ambulatory Visit
Admission: RE | Admit: 2019-02-26 | Discharge: 2019-02-26 | Disposition: A | Discharge status: Home / Self Care | Payer: Medicare Other | Source: Ambulatory Visit | Attending: Radiation Oncology | Admitting: Radiation Oncology

## 2019-02-26 ENCOUNTER — Encounter: Payer: Self-pay | Admitting: *Deleted

## 2019-02-26 ENCOUNTER — Other Ambulatory Visit: Payer: Self-pay

## 2019-02-26 ENCOUNTER — Encounter: Payer: Self-pay | Admitting: Radiation Oncology

## 2019-02-26 DIAGNOSIS — C44320 Squamous cell carcinoma of skin of unspecified parts of face: Secondary | ICD-10-CM | POA: Insufficient documentation

## 2019-02-26 DIAGNOSIS — C4402 Squamous cell carcinoma of skin of lip: Secondary | ICD-10-CM

## 2019-02-26 NOTE — Progress Notes (Signed)
Radiation Oncology         [phone] ________________________________  Initial outpatient Consultation by WebEx due to pandemic precauations   Name: Michael Bates MRN: [id number]  Date: 02/26/2019  DOB: 06/15/1941  [id number], Christiane Ha, MD  Karin Golden, MD   REFERRING PHYSICIAN: Karin Golden, MD  DIAGNOSIS:    ICD-10-CM   1. Squamous cell skin cancer, face  C44.320    Cancer Staging Squamous cell skin cancer, face Staging form: Cutaneous Carcinoma of the Head and Neck, AJCC 8th Edition - Pathologic stage from 02/26/2019: Stage III (pT3, pN0, cM0) - Signed by Eppie Gibson, MD on 02/26/2019   CHIEF COMPLAINT: Here to discuss management of skin cancer  HISTORY OF PRESENT ILLNESS::Michael Bates is a 78 y.o. male who presented with irritation and scale formation to the skin of his upper lip. Sometimes, felt a "zap" when he touched it. He met with Dr. Winifred Olive and underwent shave biopsy on 01/21/2019. Pathology from the procedure showed invasive squamous cell carcinoma, with extension to the deep margin.   There was suspicion for microcystic adnexal carcinoma. He proceeded to Mohs procedure on 02/12/2019. Pathology revealed invasive squamous cell carcinoma with multiple foci of perineural invasion. Largest diameter nerve: 0.77m.  He had multiple xray exposures as a child.  Multiple skin cancers removed in the past.   He ia a retired professor of neuroscience.   PREVIOUS RADIATION THERAPY: No  PAST MEDICAL HISTORY:  has a past medical history of BPH (benign prostatic hyperplasia), Degenerative arthritis of cervical spine, Family history of adverse reaction to anesthesia, Hyperlipidemia, Osteopenia, Raynaud disease, Renal disease, and Thyroid disease.    PAST SURGICAL HISTORY: Past Surgical History:  Procedure Laterality Date  . COLONOSCOPY WITH PROPOFOL N/A 01/10/2016   Procedure: COLONOSCOPY WITH PROPOFOL;  Surgeon: MGarlan Fair MD;  Location: [facility];  Service:  Endoscopy;  Laterality: N/A;  . HEMORROIDECTOMY    . PAROTIDECTOMY  2017   Dr. BRedmond Baseman . PROSTATECTOMY      FAMILY HISTORY: family history is not on file.  SOCIAL HISTORY:  reports that he quit smoking about 41 years ago. His smoking use included cigarettes and pipe. He has a 20.00 pack-year smoking history. He has never used smokeless tobacco. He reports current alcohol use of about 7.0 standard drinks of alcohol per week. He reports that he does not use drugs.  ALLERGIES: Tape  MEDICATIONS:  Current Outpatient Medications  Medication Sig Dispense Refill  . albuterol (PROVENTIL HFA;VENTOLIN HFA) 108 (90 BASE) MCG/ACT inhaler Inhale 2 puffs into the lungs every 6 (six) hours as needed for wheezing or shortness of breath.     . beclomethasone (QVAR) 40 MCG/ACT inhaler Inhale into the lungs 2 (two) times daily. 80 mcg as needed    . buPROPion (WELLBUTRIN XL) 300 MG 24 hr tablet Take 300 mg by mouth daily.    . Cholecalciferol (VITAMIN D3) 5000 units CAPS Take 5,000 Units by mouth daily.    . Coenzyme Q-10 100 MG capsule Take 100 mg by mouth daily.    . Glucos-Chondroit-Collag-Hyal (GLUCOSAMINE CHONDROIT-COLLAGEN PO) Take 1 tablet by mouth daily.     .Marland Kitchenlevothyroxine (SYNTHROID, LEVOTHROID) 200 MCG tablet Take 200 mcg by mouth every morning.    . methylphenidate 18 MG PO CR tablet Take 18 mg by mouth daily.    . mirabegron ER (MYRBETRIQ) 50 MG TB24 tablet Take 50 mg by mouth daily.    . Misc Natural Products (LUTEIN 20) CAPS Take 1 capsule by  mouth daily.     . Multiple Vitamins-Minerals (CENTRUM ULTRA MENS PO) Take 1 tablet by mouth daily.     . Omega-3 Fatty Acids (FISH OIL) 1000 MG CAPS Take by mouth.    . tadalafil (CIALIS) 5 MG tablet Take 5 mg by mouth daily.     . TURMERIC CURCUMIN PO Take 1 capsule by mouth 2 (two) times daily.     . vitamin C (ASCORBIC ACID) 500 MG tablet Take 500 mg by mouth daily.    Marland Kitchen gabapentin (NEURONTIN) 300 MG capsule Take 1 capsule (300 mg total) by mouth  daily for 15 doses. 15 capsule 0  . oxyCODONE-acetaminophen (PERCOCET/ROXICET) 5-325 MG tablet Take 1 tablet by mouth every 6 (six) hours as needed for severe pain. (Patient not taking: Reported on 02/26/2019) 5 tablet 0   No current facility-administered medications for this encounter.     REVIEW OF SYSTEMS:  Notable for that above.   PHYSICAL EXAM:  vitals were not taken for this visit.   Surgical photos     LABORATORY DATA:  No results found for: WBC, HGB, HCT, MCV, PLT CMP  No results found for: NA, K, CL, CO2, GLUCOSE, BUN, CREATININE, CALCIUM, PROT, ALBUMIN, AST, ALT, ALKPHOS, BILITOT, GFRNONAA, GFRAA       RADIOGRAPHY: No results found.    IMPRESSION/PLAN: Skin Cancer of Upper Lip Today, I talked to the patient about the findings and work-up thus far. We discussed the patient's diagnosis of skin cancer with perineural invasion and general treatment for this, highlighting the role of radiotherapy in the management. We discussed the available radiation techniques, and focused on the details of logistics and delivery.    We discussed the risks, benefits, and side effects of radiotherapy. Side effects may include but not necessarily be limited to: skin irritation/blistering of lip, hair loss.  No guarantees of treatment were given. The patient was encouraged to ask questions that I answered to the best of my ability.   Will proceed with simulation later this month to allow more healing. Anticipate 20 fractions. He is pleased with this plan.  Of note, I reviewed the above photos with Dr. Winifred Olive. Margins were negative.  The tumor was confined to the central area in the first photo. Dr. Winifred Olive believes that irradiating the area that represents the defect in the first photo and a safety margin of at least 34m on all sides around that would be prudent.    This encounter was provided by telemedicine platform Webex.  The patient has given verbal consent for this type of encounter and  has been advised to only accept a meeting of this type in a secure network environment. The time spent during this encounter was over 20 minutes. The attendants for this meeting include SEppie Gibson and WGuadalupe Dawn  During the encounter, [facility].  WGuadalupe Dawnwas located at home.    __________________________________________   SEppie Gibson MD  This document serves as a record of services personally performed by SEppie Gibson MD. It was created on her behalf by KWilburn Mylar a trained medical scribe. The creation of this record is based on the scribe's personal observations and the provider's statements to them. This document has been checked and approved by the attending provider.

## 2019-03-01 NOTE — Progress Notes (Signed)
Oncology Nurse Navigator Documentation  Met with patient during WebEx initial consult with Dr. Isidore Moos.   . Introduced myself as his Navigator, explained my role as a member of the Care Team, provided my contact information.   . Provided introductory explanation of radiation treatment including SIM planning and purpose of Aquaplast head and shoulder mask, showed him example.   Michael Bates He later e-mailed copy of pathology report for 03/20/2016 parotidectomy conducted by Dr. Redmond Baseman (submitted for scanning to RadOnc admin). . I encouraged him to call me with questions/concerns prior to pending appt for CT SIM. He verbalized understanding of information provided.    Gayleen Orem, RN, BSN Head & Neck Oncology Nurse [place] at [place] [phone]

## 2019-03-03 ENCOUNTER — Encounter: Payer: Self-pay | Admitting: *Deleted

## 2019-03-11 ENCOUNTER — Ambulatory Visit
Admission: RE | Admit: 2019-03-11 | Discharge: 2019-03-11 | Disposition: A | Discharge status: Home / Self Care | Payer: Medicare Other | Source: Ambulatory Visit | Attending: Radiation Oncology | Admitting: Radiation Oncology

## 2019-03-11 ENCOUNTER — Other Ambulatory Visit: Payer: Self-pay

## 2019-03-11 DIAGNOSIS — C44320 Squamous cell carcinoma of skin of unspecified parts of face: Secondary | ICD-10-CM | POA: Insufficient documentation

## 2019-03-11 DIAGNOSIS — Z510 Encounter for antineoplastic radiation therapy: Secondary | ICD-10-CM | POA: Insufficient documentation

## 2019-03-12 ENCOUNTER — Encounter: Payer: Self-pay | Admitting: *Deleted

## 2019-03-12 ENCOUNTER — Telehealth: Payer: Self-pay | Admitting: *Deleted

## 2019-03-12 DIAGNOSIS — Z510 Encounter for antineoplastic radiation therapy: Secondary | ICD-10-CM | POA: Diagnosis not present

## 2019-03-12 NOTE — Telephone Encounter (Signed)
Oncology Nurse Navigator Documentation  In follow-up to pt My Chart messages re conflicts with upcoming appts, called him, confirmed his understanding 10/16 RT appt has been adjusted to accommodate same day dermatology appt; assured him RT appt will not conflict with [id number] dental appt.  I encouraged him to call me directly with future conflicts vs sending messages via My Chart.  He voiced understanding.  Gayleen Orem, RN, BSN Head & Neck Oncology Nurse [place] at [place] [phone]

## 2019-03-12 NOTE — Progress Notes (Signed)
  Radiation Oncology         (336) [phone] ________________________________  Name: Michael Bates MRN: [id number]  Date: 03/11/2019  DOB: 03-13-1941  SIMULATION AND TREATMENT PLANNING NOTE  Outpatient  DIAGNOSIS:     ICD-10-CM   1. Squamous cell skin cancer, face  C44.320     NARRATIVE:  The patient was brought to the [facility].  Identity was confirmed.  All relevant records and images related to the planned course of therapy were reviewed.  The patient freely provided informed written consent to proceed with treatment after reviewing the details related to the planned course of therapy. The consent form was witnessed and verified by the simulation staff.     Then, the patient was set-up in a stable reproducible  supine position for radiation therapy.  Customized head mask was made for immobilization. Customized lead block with plastic wrap was placed under his upper lip. I drew around their tumor site with marker.    TREATMENT PLANNING NOTE: Patient was taken to [facility]. Treatment planning then occurred. En face positioning of beam was determined. The radiation prescription was entered and confirmed.     A total of 3 medically necessary complex treatment devices were fabricated and supervised by me, in the form of head mask, lead block, and customized electron cut out.   The patient will receive 50 Gy in 20 fractions to the upper lip with electrons and bolus.    -----------------------------------  Eppie Gibson, MD

## 2019-03-17 ENCOUNTER — Ambulatory Visit
Admission: RE | Admit: 2019-03-17 | Discharge: 2019-03-17 | Disposition: A | Discharge status: Home / Self Care | Payer: Medicare Other | Source: Ambulatory Visit | Attending: Radiation Oncology | Admitting: Radiation Oncology

## 2019-03-17 ENCOUNTER — Encounter: Payer: Self-pay | Admitting: *Deleted

## 2019-03-17 ENCOUNTER — Other Ambulatory Visit: Payer: Self-pay

## 2019-03-17 DIAGNOSIS — C44320 Squamous cell carcinoma of skin of unspecified parts of face: Secondary | ICD-10-CM

## 2019-03-17 DIAGNOSIS — Z510 Encounter for antineoplastic radiation therapy: Secondary | ICD-10-CM | POA: Diagnosis not present

## 2019-03-17 MED ORDER — SONAFINE EX EMUL
1.0000 "application " | Freq: Two times a day (BID) | CUTANEOUS | Status: DC
  Administered 2019-03-17: 1 via TOPICAL

## 2019-03-17 NOTE — Progress Notes (Signed)
Oncology Nurse Navigator Documentation  To provide support, encouragement and care continuity, met with Mr. Abshier for his initial  RT.    I reviewed registration/arrival procedure for future appts.  Per RTT, he managed tmt w/o difficulty.  Gayleen Orem, RN, BSN Head & Neck Oncology Nurse [place] at [place] [phone]

## 2019-03-18 ENCOUNTER — Other Ambulatory Visit: Payer: Self-pay

## 2019-03-18 ENCOUNTER — Ambulatory Visit
Admission: RE | Admit: 2019-03-18 | Discharge: 2019-03-18 | Disposition: A | Discharge status: Home / Self Care | Payer: Medicare Other | Source: Ambulatory Visit | Attending: Radiation Oncology | Admitting: Radiation Oncology

## 2019-03-18 DIAGNOSIS — Z510 Encounter for antineoplastic radiation therapy: Secondary | ICD-10-CM | POA: Diagnosis not present

## 2019-03-18 NOTE — Telephone Encounter (Signed)
Patient called, appt conflicts resolved.

## 2019-03-19 ENCOUNTER — Ambulatory Visit
Admission: RE | Admit: 2019-03-19 | Discharge: 2019-03-19 | Disposition: A | Discharge status: Home / Self Care | Payer: Medicare Other | Source: Ambulatory Visit | Attending: Radiation Oncology | Admitting: Radiation Oncology

## 2019-03-19 ENCOUNTER — Other Ambulatory Visit: Payer: Self-pay

## 2019-03-19 DIAGNOSIS — Z510 Encounter for antineoplastic radiation therapy: Secondary | ICD-10-CM | POA: Diagnosis not present

## 2019-03-20 ENCOUNTER — Ambulatory Visit
Admission: RE | Admit: 2019-03-20 | Discharge: 2019-03-20 | Disposition: A | Discharge status: Home / Self Care | Payer: Medicare Other | Source: Ambulatory Visit | Attending: Radiation Oncology | Admitting: Radiation Oncology

## 2019-03-20 ENCOUNTER — Other Ambulatory Visit: Payer: Self-pay

## 2019-03-20 DIAGNOSIS — Z510 Encounter for antineoplastic radiation therapy: Secondary | ICD-10-CM | POA: Insufficient documentation

## 2019-03-20 DIAGNOSIS — C44320 Squamous cell carcinoma of skin of unspecified parts of face: Secondary | ICD-10-CM | POA: Diagnosis present

## 2019-03-21 ENCOUNTER — Ambulatory Visit
Admission: RE | Admit: 2019-03-21 | Discharge: 2019-03-21 | Disposition: A | Discharge status: Home / Self Care | Payer: Medicare Other | Source: Ambulatory Visit | Attending: Radiation Oncology | Admitting: Radiation Oncology

## 2019-03-21 ENCOUNTER — Other Ambulatory Visit: Payer: Self-pay

## 2019-03-21 DIAGNOSIS — Z510 Encounter for antineoplastic radiation therapy: Secondary | ICD-10-CM | POA: Diagnosis not present

## 2019-03-24 ENCOUNTER — Other Ambulatory Visit: Payer: Self-pay

## 2019-03-24 ENCOUNTER — Ambulatory Visit
Admission: RE | Admit: 2019-03-24 | Discharge: 2019-03-24 | Disposition: A | Discharge status: Home / Self Care | Payer: Medicare Other | Source: Ambulatory Visit | Attending: Radiation Oncology | Admitting: Radiation Oncology

## 2019-03-24 DIAGNOSIS — Z510 Encounter for antineoplastic radiation therapy: Secondary | ICD-10-CM | POA: Diagnosis not present

## 2019-03-25 ENCOUNTER — Ambulatory Visit
Admission: RE | Admit: 2019-03-25 | Discharge: 2019-03-25 | Disposition: A | Discharge status: Home / Self Care | Payer: Medicare Other | Source: Ambulatory Visit | Attending: Radiation Oncology | Admitting: Radiation Oncology

## 2019-03-25 ENCOUNTER — Other Ambulatory Visit: Payer: Self-pay | Admitting: Radiation Oncology

## 2019-03-25 ENCOUNTER — Other Ambulatory Visit: Payer: Self-pay

## 2019-03-25 DIAGNOSIS — C4402 Squamous cell carcinoma of skin of lip: Secondary | ICD-10-CM

## 2019-03-25 DIAGNOSIS — Z510 Encounter for antineoplastic radiation therapy: Secondary | ICD-10-CM | POA: Diagnosis not present

## 2019-03-25 MED ORDER — LIDOCAINE VISCOUS HCL 2 % MT SOLN: OROMUCOSAL | 2 refills | Status: DC

## 2019-03-26 ENCOUNTER — Ambulatory Visit
Admission: RE | Admit: 2019-03-26 | Discharge: 2019-03-26 | Disposition: A | Discharge status: Home / Self Care | Payer: Medicare Other | Source: Ambulatory Visit | Attending: Radiation Oncology | Admitting: Radiation Oncology

## 2019-03-26 DIAGNOSIS — Z510 Encounter for antineoplastic radiation therapy: Secondary | ICD-10-CM | POA: Diagnosis not present

## 2019-03-27 ENCOUNTER — Other Ambulatory Visit: Payer: Self-pay

## 2019-03-27 ENCOUNTER — Ambulatory Visit
Admission: RE | Admit: 2019-03-27 | Discharge: 2019-03-27 | Disposition: A | Discharge status: Home / Self Care | Payer: Medicare Other | Source: Ambulatory Visit | Attending: Radiation Oncology | Admitting: Radiation Oncology

## 2019-03-27 DIAGNOSIS — Z510 Encounter for antineoplastic radiation therapy: Secondary | ICD-10-CM | POA: Diagnosis not present

## 2019-03-28 ENCOUNTER — Other Ambulatory Visit: Payer: Self-pay

## 2019-03-28 ENCOUNTER — Ambulatory Visit
Admission: RE | Admit: 2019-03-28 | Discharge: 2019-03-28 | Disposition: A | Discharge status: Home / Self Care | Payer: Medicare Other | Source: Ambulatory Visit | Attending: Radiation Oncology | Admitting: Radiation Oncology

## 2019-03-28 DIAGNOSIS — Z510 Encounter for antineoplastic radiation therapy: Secondary | ICD-10-CM | POA: Diagnosis not present

## 2019-03-31 ENCOUNTER — Other Ambulatory Visit: Payer: Self-pay

## 2019-03-31 ENCOUNTER — Ambulatory Visit
Admission: RE | Admit: 2019-03-31 | Discharge: 2019-03-31 | Disposition: A | Discharge status: Home / Self Care | Payer: Medicare Other | Source: Ambulatory Visit | Attending: Radiation Oncology | Admitting: Radiation Oncology

## 2019-03-31 DIAGNOSIS — Z510 Encounter for antineoplastic radiation therapy: Secondary | ICD-10-CM | POA: Diagnosis not present

## 2019-04-01 ENCOUNTER — Ambulatory Visit
Admission: RE | Admit: 2019-04-01 | Discharge: 2019-04-01 | Disposition: A | Discharge status: Home / Self Care | Payer: Medicare Other | Source: Ambulatory Visit | Attending: Radiation Oncology | Admitting: Radiation Oncology

## 2019-04-01 DIAGNOSIS — Z510 Encounter for antineoplastic radiation therapy: Secondary | ICD-10-CM | POA: Diagnosis not present

## 2019-04-02 ENCOUNTER — Other Ambulatory Visit: Payer: Self-pay

## 2019-04-02 ENCOUNTER — Ambulatory Visit
Admission: RE | Admit: 2019-04-02 | Discharge: 2019-04-02 | Disposition: A | Discharge status: Home / Self Care | Payer: Medicare Other | Source: Ambulatory Visit | Attending: Radiation Oncology | Admitting: Radiation Oncology

## 2019-04-02 DIAGNOSIS — Z510 Encounter for antineoplastic radiation therapy: Secondary | ICD-10-CM | POA: Diagnosis not present

## 2019-04-03 ENCOUNTER — Ambulatory Visit
Admission: RE | Admit: 2019-04-03 | Discharge: 2019-04-03 | Disposition: A | Discharge status: Home / Self Care | Payer: Medicare Other | Source: Ambulatory Visit | Attending: Radiation Oncology | Admitting: Radiation Oncology

## 2019-04-03 ENCOUNTER — Other Ambulatory Visit: Payer: Self-pay

## 2019-04-03 DIAGNOSIS — Z510 Encounter for antineoplastic radiation therapy: Secondary | ICD-10-CM | POA: Diagnosis not present

## 2019-04-04 ENCOUNTER — Ambulatory Visit
Admission: RE | Admit: 2019-04-04 | Discharge: 2019-04-04 | Disposition: A | Discharge status: Home / Self Care | Payer: Medicare Other | Source: Ambulatory Visit | Attending: Radiation Oncology | Admitting: Radiation Oncology

## 2019-04-04 ENCOUNTER — Other Ambulatory Visit: Payer: Self-pay

## 2019-04-04 DIAGNOSIS — Z510 Encounter for antineoplastic radiation therapy: Secondary | ICD-10-CM | POA: Diagnosis not present

## 2019-04-07 ENCOUNTER — Ambulatory Visit
Admission: RE | Admit: 2019-04-07 | Discharge: 2019-04-07 | Disposition: A | Discharge status: Home / Self Care | Payer: Medicare Other | Source: Ambulatory Visit | Attending: Radiation Oncology | Admitting: Radiation Oncology

## 2019-04-07 DIAGNOSIS — Z510 Encounter for antineoplastic radiation therapy: Secondary | ICD-10-CM | POA: Diagnosis not present

## 2019-04-08 ENCOUNTER — Other Ambulatory Visit: Payer: Self-pay

## 2019-04-08 ENCOUNTER — Ambulatory Visit
Admission: RE | Admit: 2019-04-08 | Discharge: 2019-04-08 | Disposition: A | Discharge status: Home / Self Care | Payer: Medicare Other | Source: Ambulatory Visit | Attending: Radiation Oncology | Admitting: Radiation Oncology

## 2019-04-08 DIAGNOSIS — Z510 Encounter for antineoplastic radiation therapy: Secondary | ICD-10-CM | POA: Diagnosis not present

## 2019-04-09 ENCOUNTER — Ambulatory Visit
Admission: RE | Admit: 2019-04-09 | Discharge: 2019-04-09 | Disposition: A | Discharge status: Home / Self Care | Payer: Medicare Other | Source: Ambulatory Visit | Attending: Radiation Oncology | Admitting: Radiation Oncology

## 2019-04-09 DIAGNOSIS — Z510 Encounter for antineoplastic radiation therapy: Secondary | ICD-10-CM | POA: Diagnosis not present

## 2019-04-10 ENCOUNTER — Ambulatory Visit
Admission: RE | Admit: 2019-04-10 | Discharge: 2019-04-10 | Disposition: A | Discharge status: Home / Self Care | Payer: Medicare Other | Source: Ambulatory Visit | Attending: Radiation Oncology | Admitting: Radiation Oncology

## 2019-04-10 ENCOUNTER — Other Ambulatory Visit: Payer: Self-pay

## 2019-04-10 DIAGNOSIS — Z510 Encounter for antineoplastic radiation therapy: Secondary | ICD-10-CM | POA: Diagnosis not present

## 2019-04-11 ENCOUNTER — Encounter: Payer: Self-pay | Admitting: *Deleted

## 2019-04-11 ENCOUNTER — Other Ambulatory Visit: Payer: Self-pay

## 2019-04-11 ENCOUNTER — Ambulatory Visit
Admission: RE | Admit: 2019-04-11 | Discharge: 2019-04-11 | Disposition: A | Discharge status: Home / Self Care | Payer: Medicare Other | Source: Ambulatory Visit | Attending: Radiation Oncology | Admitting: Radiation Oncology

## 2019-04-11 ENCOUNTER — Encounter: Payer: Self-pay | Admitting: Radiation Oncology

## 2019-04-11 DIAGNOSIS — Z510 Encounter for antineoplastic radiation therapy: Secondary | ICD-10-CM | POA: Diagnosis not present

## 2019-04-12 NOTE — Progress Notes (Signed)
Oncology Nurse Navigator Documentation  Met with Michael Bates after final RT to offer support and to celebrate end of radiation treatment.   Provided verbal/written post-RT guidance:  Importance of keeping all follow-up appts, noted 11/10 2:20 RTC to see Dr. Isidore Moos.  Importance of protecting treatment area from sun.  Continuation of Sonafine application 2-3 times daily, application of abx ointment to areas of raw skin; when supply of Sonafine exhausted transition to OTC lotion with vitamin E.  Lip swelling should begin to resolve in about 2 weeks. Explained my role as navigator will continue for several more months, I will be calling or joining him during follow-up visits.   He agreed to call me with needs/concerns.    Gayleen Orem, RN, BSN Head & Neck Oncology [place] at [place] [phone]

## 2019-04-28 NOTE — Progress Notes (Signed)
Michael Bates presents for follow up of radiation completed 04/11/19 to his upper lip.   Pain issues, if any: He denies Using a feeding tube?: N/A Weight changes, if any: 04/29/19 patient has a boot on due to a broken toe, which has increased his weight slightly.  Wt Readings from Last 3 Encounters:  04/29/19 162 lb (73.5 kg)  01/10/16 155 lb (70.3 kg)   Swallowing issues, if any: No Smoking or chewing tobacco? No Using fluoride trays daily? N/A Last ENT visit was on: N/A Other notable issues, if any:  He reports his skin has healed well. He is using aquaphor and sonafine to his radiation site.  He reports continued blisters but they have decreased in size.  BP (!) 158/73 (BP Location: Left Arm)   Pulse 74   Temp 98.3 F (36.8 C) (Tympanic)   Wt 162 lb (73.5 kg)   SpO2 100%   BMI 22.59 kg/m

## 2019-04-29 ENCOUNTER — Encounter: Payer: Self-pay | Admitting: Radiation Oncology

## 2019-04-29 ENCOUNTER — Ambulatory Visit
Admission: RE | Admit: 2019-04-29 | Discharge: 2019-04-29 | Disposition: A | Discharge status: Home / Self Care | Payer: Medicare Other | Source: Ambulatory Visit | Attending: Radiation Oncology | Admitting: Radiation Oncology

## 2019-04-29 ENCOUNTER — Other Ambulatory Visit: Payer: Self-pay

## 2019-04-29 DIAGNOSIS — Z79899 Other long term (current) drug therapy: Secondary | ICD-10-CM | POA: Diagnosis not present

## 2019-04-29 DIAGNOSIS — C44320 Squamous cell carcinoma of skin of unspecified parts of face: Secondary | ICD-10-CM | POA: Insufficient documentation

## 2019-04-29 DIAGNOSIS — Z923 Personal history of irradiation: Secondary | ICD-10-CM | POA: Insufficient documentation

## 2019-04-29 NOTE — Progress Notes (Signed)
Radiation Oncology         [phone]) [phone] ________________________________  Name: Michael Bates MRN: [id number]  Date: 04/29/2019  DOB: 1940-10-31  Follow-Up Visit Note  Outpatient  CC: Lajean Manes, MD  Karin Golden, MD  Diagnosis and Prior Radiotherapy:    ICD-10-CM   1. Squamous cell skin cancer, face  C44.320      03/17/2019 through 04/11/2019 Site Technique Total Dose (Gy) Dose per Fx (Gy) Completed Fx Beam Energies  Head & neck: HN_up_lip Complex 50/50 2.5 20/20 6E   CHIEF COMPLAINT: Here for follow-up and surveillance of skin cancer  Narrative:  The patient returns today for routine follow-up.  He is doing well.  He still has  swelling of his upper lip and a little bit of mucosal irritation.  But he does not feel the need to use lidocaine and he is eating well.   ALLERGIES:  is allergic to tape.  Meds: Current Outpatient Medications  Medication Sig Dispense Refill  . albuterol (PROVENTIL HFA;VENTOLIN HFA) 108 (90 BASE) MCG/ACT inhaler Inhale 2 puffs into the lungs every 6 (six) hours as needed for wheezing or shortness of breath.     . beclomethasone (QVAR) 40 MCG/ACT inhaler Inhale into the lungs 2 (two) times daily. 80 mcg as needed    . buPROPion (WELLBUTRIN XL) 300 MG 24 hr tablet Take 300 mg by mouth daily.    . Cholecalciferol (VITAMIN D3) 5000 units CAPS Take 5,000 Units by mouth daily.    . Coenzyme Q-10 100 MG capsule Take 100 mg by mouth daily.    . Glucos-Chondroit-Collag-Hyal (GLUCOSAMINE CHONDROIT-COLLAGEN PO) Take 1 tablet by mouth daily.     Marland Kitchen levothyroxine (SYNTHROID, LEVOTHROID) 200 MCG tablet Take 200 mcg by mouth every morning.    . lidocaine (XYLOCAINE) 2 % solution Apply to mucosa as directed, not to exceed 97mL daily. 200 mL 2  . methylphenidate 18 MG PO CR tablet Take 18 mg by mouth daily.    . mirabegron ER (MYRBETRIQ) 50 MG TB24 tablet Take 50 mg by mouth daily.    . Misc Natural Products (LUTEIN 20) CAPS Take 1 capsule by mouth daily.      . Multiple Vitamins-Minerals (CENTRUM ULTRA MENS PO) Take 1 tablet by mouth daily.     . Omega-3 Fatty Acids (FISH OIL) 1000 MG CAPS Take by mouth.    . tadalafil (CIALIS) 5 MG tablet Take 5 mg by mouth daily.     . TURMERIC CURCUMIN PO Take 1 capsule by mouth 2 (two) times daily.     . vitamin C (ASCORBIC ACID) 500 MG tablet Take 500 mg by mouth daily.    Marland Kitchen gabapentin (NEURONTIN) 300 MG capsule Take 1 capsule (300 mg total) by mouth daily for 15 doses. 15 capsule 0  . oxyCODONE-acetaminophen (PERCOCET/ROXICET) 5-325 MG tablet Take 1 tablet by mouth every 6 (six) hours as needed for severe pain. (Patient not taking: Reported on 04/29/2019) 5 tablet 0   No current facility-administered medications for this encounter.     Physical Findings: The patient is in no acute distress. Patient is alert and oriented.  weight is 162 lb (73.5 kg). His tympanic temperature is 98.3 F (36.8 C). His blood pressure is 158/73 (abnormal) and his pulse is 74. His oxygen saturation is 100%. .    Resolving mucositis of inner upper lip.  Modest persistent swelling of upper lip.  Alopecia of upper lip  Lab Findings: No results found for: WBC, HGB, HCT, MCV, PLT  Radiographic Findings: No results found.  Impression/Plan: He is healing well from radiotherapy.  We talked about good sun hygiene.  He will see his dermatologist in 3 months.  He knows to continue following up with dermatology regularly.  I will see him back on an as-needed basis.  He is pleased with this plan.  I spent 15 minutes face to face with the patient and more than 50% of that time was spent in counseling and/or coordination of care. _____________________________________   Eppie Gibson, MD  This document serves as a record of services personally performed by Eppie Gibson, MD. It was created on her behalf by Wilburn Mylar, a trained medical scribe. The creation of this record is based on the scribe's personal observations and the  provider's statements to them. This document has been checked and approved by the attending provider.

## 2019-04-29 NOTE — Progress Notes (Signed)
  Patient Name: Michael Bates MRN: [id number] DOB: December 28, 1940 Referring Physician: Karin Golden Date of Service: 04/11/2019  [facility] [place]                                                        End Of Treatment Note  Diagnoses: C44.02-Squamous cell carcinoma of skin of lip  Cancer Staging Squamous cell skin cancer, face Staging form: Cutaneous Carcinoma of the Head and Neck, AJCC 8th Edition - Pathologic stage from 02/26/2019: Stage III (pT3, pN0, cM0) - Signed by Eppie Gibson, MD on 02/26/2019   Intent: Curative  Radiation Treatment Dates: 03/17/2019 through 04/11/2019 Site Technique Total Dose (Gy) Dose per Fx (Gy) Completed Fx Beam Energies  Head & neck: HN_up_lip Complex 50/50 2.5 20/20 6E   Narrative: The patient tolerated radiation therapy relatively well. He reported pain and tenderness to the radiation site. He denied any fatigue. He was reported to have alopecia of his mustache, upper lip swelling, confluent mucositis in mucosa of the upper lip, and a blister in that area.  Plan: The patient will follow-up with radiation oncology in 2 weeks.  ________________________________________________   Eppie Gibson, MD  This document serves as a record of services personally performed by Eppie Gibson, MD. It was created on her behalf by Wilburn Mylar, a trained medical scribe. The creation of this record is based on the scribe's personal observations and the provider's statements to them. This document has been checked and approved by the attending provider.

## 2019-06-28 ENCOUNTER — Ambulatory Visit: Payer: Medicare Other | Attending: Internal Medicine

## 2019-06-28 DIAGNOSIS — Z23 Encounter for immunization: Secondary | ICD-10-CM

## 2019-06-28 NOTE — Progress Notes (Signed)
   Covid-19 Vaccination Clinic  Name:  Michael Bates    MRN: [id number] DOB: 12-Nov-1940  06/28/2019  Mr. Michael Bates was observed post Covid-19 immunization for 15 minutes without incidence. He was provided with Vaccine Information Sheet and instruction to access the V-Safe system.   Mr. Michael Bates was instructed to call 911 with any severe reactions post vaccine: Marland Kitchen Difficulty breathing  . Swelling of your face and throat  . A fast heartbeat  . A bad rash all over your body  . Dizziness and weakness    Immunizations Administered    Name Date Dose VIS Date Route   Pfizer COVID-19 Vaccine 06/28/2019 12:16 PM 0.3 mL 05/30/2019 Intramuscular   Manufacturer: [employer], [employer]   Lot: [id number]   [place]: [id number]

## 2019-07-16 ENCOUNTER — Other Ambulatory Visit: Payer: Self-pay | Admitting: Geriatric Medicine

## 2019-07-16 DIAGNOSIS — M85852 Other specified disorders of bone density and structure, left thigh: Secondary | ICD-10-CM

## 2019-07-18 ENCOUNTER — Ambulatory Visit: Payer: Medicare PPO

## 2019-07-19 ENCOUNTER — Ambulatory Visit: Payer: Medicare PPO | Attending: Internal Medicine

## 2019-07-19 DIAGNOSIS — Z23 Encounter for immunization: Secondary | ICD-10-CM | POA: Insufficient documentation

## 2019-07-19 NOTE — Progress Notes (Signed)
   Covid-19 Vaccination Clinic  Name:  ALEGEND LIESKE    MRN: [id number] DOB: [birth year]  07/19/2019  Mr. Ingalls was observed post Covid-19 immunization for 15 minutes without incidence. He was provided with Vaccine Information Sheet and instruction to access the V-Safe system.   Mr. Hed was instructed to call 911 with any severe reactions post vaccine: Marland Kitchen Difficulty breathing  . Swelling of your face and throat  . A fast heartbeat  . A bad rash all over your body  . Dizziness and weakness    Immunizations Administered    Name Date Dose VIS Date Route   Pfizer COVID-19 Vaccine 07/19/2019 10:13 AM 0.3 mL 05/30/2019 Intramuscular   Manufacturer: [place]   Lot: [id number]   [place]: [id number]

## 2019-09-09 DIAGNOSIS — S92334G Nondisplaced fracture of third metatarsal bone, right foot, subsequent encounter for fracture with delayed healing: Secondary | ICD-10-CM | POA: Diagnosis not present

## 2019-09-09 DIAGNOSIS — M79671 Pain in right foot: Secondary | ICD-10-CM | POA: Diagnosis not present

## 2019-09-09 DIAGNOSIS — S92334K Nondisplaced fracture of third metatarsal bone, right foot, subsequent encounter for fracture with nonunion: Secondary | ICD-10-CM | POA: Diagnosis not present

## 2019-09-22 ENCOUNTER — Ambulatory Visit
Admission: RE | Admit: 2019-09-22 | Discharge: 2019-09-22 | Disposition: A | Discharge status: Home / Self Care | Payer: Medicare PPO | Source: Ambulatory Visit | Attending: Geriatric Medicine | Admitting: Geriatric Medicine

## 2019-09-22 ENCOUNTER — Other Ambulatory Visit: Payer: Self-pay

## 2019-09-22 DIAGNOSIS — M85852 Other specified disorders of bone density and structure, left thigh: Secondary | ICD-10-CM | POA: Diagnosis not present

## 2019-09-23 DIAGNOSIS — S92334K Nondisplaced fracture of third metatarsal bone, right foot, subsequent encounter for fracture with nonunion: Secondary | ICD-10-CM | POA: Diagnosis not present

## 2019-10-01 DIAGNOSIS — M8580 Other specified disorders of bone density and structure, unspecified site: Secondary | ICD-10-CM | POA: Diagnosis not present

## 2019-10-01 DIAGNOSIS — I10 Essential (primary) hypertension: Secondary | ICD-10-CM | POA: Diagnosis not present

## 2019-10-01 DIAGNOSIS — E222 Syndrome of inappropriate secretion of antidiuretic hormone: Secondary | ICD-10-CM | POA: Diagnosis not present

## 2019-10-01 DIAGNOSIS — E039 Hypothyroidism, unspecified: Secondary | ICD-10-CM | POA: Diagnosis not present

## 2019-10-01 DIAGNOSIS — Z79899 Other long term (current) drug therapy: Secondary | ICD-10-CM | POA: Diagnosis not present

## 2019-10-01 DIAGNOSIS — E7800 Pure hypercholesterolemia, unspecified: Secondary | ICD-10-CM | POA: Diagnosis not present

## 2019-10-03 DIAGNOSIS — Q828 Other specified congenital malformations of skin: Secondary | ICD-10-CM | POA: Diagnosis not present

## 2019-10-03 DIAGNOSIS — L814 Other melanin hyperpigmentation: Secondary | ICD-10-CM | POA: Diagnosis not present

## 2019-10-03 DIAGNOSIS — L905 Scar conditions and fibrosis of skin: Secondary | ICD-10-CM | POA: Diagnosis not present

## 2019-10-03 DIAGNOSIS — D229 Melanocytic nevi, unspecified: Secondary | ICD-10-CM | POA: Diagnosis not present

## 2019-10-03 DIAGNOSIS — D225 Melanocytic nevi of trunk: Secondary | ICD-10-CM | POA: Diagnosis not present

## 2019-10-03 DIAGNOSIS — Z85828 Personal history of other malignant neoplasm of skin: Secondary | ICD-10-CM | POA: Diagnosis not present

## 2019-10-03 DIAGNOSIS — L570 Actinic keratosis: Secondary | ICD-10-CM | POA: Diagnosis not present

## 2019-10-03 DIAGNOSIS — L821 Other seborrheic keratosis: Secondary | ICD-10-CM | POA: Diagnosis not present

## 2019-10-28 DIAGNOSIS — M79671 Pain in right foot: Secondary | ICD-10-CM | POA: Diagnosis not present

## 2019-11-06 DIAGNOSIS — K4090 Unilateral inguinal hernia, without obstruction or gangrene, not specified as recurrent: Secondary | ICD-10-CM | POA: Diagnosis not present

## 2019-11-11 DIAGNOSIS — Z1389 Encounter for screening for other disorder: Secondary | ICD-10-CM | POA: Diagnosis not present

## 2019-11-11 DIAGNOSIS — E222 Syndrome of inappropriate secretion of antidiuretic hormone: Secondary | ICD-10-CM | POA: Diagnosis not present

## 2019-11-11 DIAGNOSIS — M8580 Other specified disorders of bone density and structure, unspecified site: Secondary | ICD-10-CM | POA: Diagnosis not present

## 2019-11-11 DIAGNOSIS — Z23 Encounter for immunization: Secondary | ICD-10-CM | POA: Diagnosis not present

## 2019-11-11 DIAGNOSIS — E7800 Pure hypercholesterolemia, unspecified: Secondary | ICD-10-CM | POA: Diagnosis not present

## 2019-11-11 DIAGNOSIS — Z0000 Encounter for general adult medical examination without abnormal findings: Secondary | ICD-10-CM | POA: Diagnosis not present

## 2019-11-11 DIAGNOSIS — F900 Attention-deficit hyperactivity disorder, predominantly inattentive type: Secondary | ICD-10-CM | POA: Diagnosis not present

## 2019-11-11 DIAGNOSIS — I10 Essential (primary) hypertension: Secondary | ICD-10-CM | POA: Diagnosis not present

## 2019-11-11 DIAGNOSIS — E039 Hypothyroidism, unspecified: Secondary | ICD-10-CM | POA: Diagnosis not present

## 2019-11-19 DIAGNOSIS — K4020 Bilateral inguinal hernia, without obstruction or gangrene, not specified as recurrent: Secondary | ICD-10-CM | POA: Diagnosis not present

## 2019-11-27 ENCOUNTER — Other Ambulatory Visit (HOSPITAL_COMMUNITY)
Admission: RE | Admit: 2019-11-27 | Discharge: 2019-11-27 | Disposition: A | Discharge status: Home / Self Care | Payer: Medicare PPO | Source: Ambulatory Visit | Attending: General Surgery | Admitting: General Surgery

## 2019-11-27 DIAGNOSIS — Z01812 Encounter for preprocedural laboratory examination: Secondary | ICD-10-CM | POA: Insufficient documentation

## 2019-11-27 DIAGNOSIS — Z20822 Contact with and (suspected) exposure to covid-19: Secondary | ICD-10-CM | POA: Diagnosis not present

## 2019-11-27 LAB — SARS CORONAVIRUS 2 (TAT 6-24 HRS): SARS Coronavirus 2: NEGATIVE

## 2019-11-27 NOTE — Progress Notes (Signed)
DUE TO COVID-19 ONLY ONE VISITOR IS ALLOWED TO COME WITH YOU AND STAY IN THE WAITING ROOM ONLY DURING PRE OP AND PROCEDURE DAY OF SURGERY. THE 1 VISITOR MAY VISIT WITH YOU AFTER SURGERY IN YOUR PRIVATE ROOM DURING VISITING HOURS ONLY!  YOU NEED TO HAVE A COVID 19 TEST ON_______ @_______ , THIS TEST MUST BE DONE BEFORE SURGERY, COME  [place] , [id number].  ([place]) ONCE YOUR COVID TEST IS COMPLETED, PLEASE BEGIN THE QUARANTINE INSTRUCTIONS AS OUTLINED IN YOUR HANDOUT.                Michael Bates  11/27/2019   Your procedure is scheduled on:  12/01/19   Report to [facility] Main  Entrance   Report to admitting at   100PM     Call this number if you have problems the morning of surgery [phone]    Remember: Do not eat food   l :After Midnight. BRUSH YOUR TEETH MORNING OF SURGERY AND RINSE YOUR MOUTH OUT, NO CHEWING GUM CANDY OR MINTS. MAY HAVE CLEAR LIQUIDS FROM 12 MIDNITE UNTIL 1200NOON DAY BEFORE SURGERY THEN NOTHING BY MOUTH.      Take these medicines the morning of surgery with A SIP OF WATER:  MYRBETRIQ, SYNTHROID, WELLBUTRIN, INHALERS AS USUAL AND BRING                                 You may not have any metal on your body including hair pins and              piercings  Do not wear jewelry, , lotions, powders or perfumes, deodorant                       Men may shave face and neck.   Do not bring valuables to the hospital. [place].  Contacts, dentures or bridgework may not be worn into surgery.  Leave suitcase in the car. After surgery it may be brought to your room.     Patients discharged the day of surgery will not be allowed to drive home. IF YOU ARE HAVING SURGERY AND GOING HOME THE SAME DAY, YOU MUST HAVE AN ADULT TO DRIVE YOU HOME AND BE WITH YOU FOR 24 HOURS. YOU MAY GO HOME BY TAXI OR UBER OR ORTHERWISE, BUT AN ADULT MUST ACCOMPANY YOU HOME AND STAY WITH YOU FOR  24 HOURS.  Name and phone number of your driver:                Please read over the following fact sheets you were given: _____________________________________________________________________                CLEAR LIQUID DIET   Foods Allowed                                                                     Foods Excluded  Coffee and tea, regular and decaf  liquids that you cannot  Plain Jell-O any favor except red or purple                                           see through such as: Fruit ices (not with fruit pulp)                                     milk, soups, orange juice  Iced Popsicles                                    All solid food Carbonated beverages, regular and diet                                    Cranberry, grape and apple juices Sports drinks like Gatorade Lightly seasoned clear broth or consume(fat free) Sugar, honey syrup  Sample Menu Breakfast                                Lunch                                     Supper Cranberry juice                    Beef broth                            Chicken broth Jell-O                                     Grape juice                           Apple juice Coffee or tea                        Jell-O                                      Popsicle                                                Coffee or tea                        Coffee or tea  _____________________________________________________________________  [facility] - Preparing for Surgery Before surgery, you can play an important role.  Because skin is not sterile, your skin needs to be as free of germs as possible.  You can reduce the number of germs on your skin by washing with CHG (chlorahexidine gluconate) soap before surgery.  CHG is an antiseptic cleaner which kills  germs and bonds with the skin to continue killing germs even after washing. Please DO NOT use if you have an allergy to CHG or antibacterial soaps.  If your  skin becomes reddened/irritated stop using the CHG and inform your nurse when you arrive at Short Stay. Do not shave (including legs and underarms) for at least 48 hours prior to the first CHG shower.  You may shave your face/neck. Please follow these instructions carefully:  1.  Shower with CHG Soap the night before surgery and the  morning of Surgery.  2.  If you choose to wash your hair, wash your hair first as usual with your  normal  shampoo.  3.  After you shampoo, rinse your hair and body thoroughly to remove the  shampoo.                           4.  Use CHG as you would any other liquid soap.  You can apply chg directly  to the skin and wash                       Gently with a scrungie or clean washcloth.  5.  Apply the CHG Soap to your body ONLY FROM THE NECK DOWN.   Do not use on face/ open                           Wound or open sores. Avoid contact with eyes, ears mouth and genitals (private parts).                       Wash face,  Genitals (private parts) with your normal soap.             6.  Wash thoroughly, paying special attention to the area where your surgery  will be performed.  7.  Thoroughly rinse your body with warm water from the neck down.  8.  DO NOT shower/wash with your normal soap after using and rinsing off  the CHG Soap.                9.  Pat yourself dry with a clean towel.            10.  Wear clean pajamas.            11.  Place clean sheets on your bed the night of your first shower and do not  sleep with pets. Day of Surgery : Do not apply any lotions/deodorants the morning of surgery.  Please wear clean clothes to the hospital/surgery center.  FAILURE TO FOLLOW THESE INSTRUCTIONS MAY RESULT IN THE CANCELLATION OF YOUR SURGERY PATIENT SIGNATURE_________________________________  NURSE SIGNATURE__________________________________  ________________________________________________________________________

## 2019-11-27 NOTE — Progress Notes (Signed)
NEED ORDERS IN Epic.  SURGERY ON 6/14.  PREOP ON 6/11.

## 2019-11-28 ENCOUNTER — Encounter (HOSPITAL_COMMUNITY)
Admission: RE | Admit: 2019-11-28 | Discharge: 2019-11-28 | Disposition: A | Discharge status: Home / Self Care | Payer: Medicare PPO | Source: Ambulatory Visit | Attending: General Surgery | Admitting: General Surgery

## 2019-11-28 ENCOUNTER — Ambulatory Visit: Payer: Self-pay | Admitting: General Surgery

## 2019-11-28 ENCOUNTER — Encounter (HOSPITAL_COMMUNITY): Payer: Self-pay | Admitting: General Surgery

## 2019-11-28 ENCOUNTER — Other Ambulatory Visit: Payer: Self-pay

## 2019-11-28 DIAGNOSIS — Z01818 Encounter for other preprocedural examination: Secondary | ICD-10-CM | POA: Diagnosis not present

## 2019-11-28 LAB — BASIC METABOLIC PANEL
Anion gap: 6 (ref 5–15)
BUN: 16 mg/dL (ref 8–23)
CO2: 31 mmol/L (ref 22–32)
Calcium: 8.8 mg/dL — ABNORMAL LOW (ref 8.9–10.3)
Chloride: 93 mmol/L — ABNORMAL LOW (ref 98–111)
Creatinine, Ser: 0.92 mg/dL (ref 0.61–1.24)
GFR calc Af Amer: >60 mL/min (ref >60)
GFR calc non Af Amer: >60 mL/min (ref >60)
Glucose, Bld: 93 mg/dL (ref 70–99)
Potassium: 4.4 mmol/L (ref 3.5–5.1)
Sodium: 130 mmol/L — ABNORMAL LOW (ref 135–145)

## 2019-11-28 LAB — CBC
HCT: 39.8 % (ref 39.0–52.0)
Hemoglobin: 13.4 g/dL (ref 13.0–17.0)
MCH: 32.8 pg (ref 26.0–34.0)
MCHC: 33.7 g/dL (ref 30.0–36.0)
MCV: 97.3 fL (ref 80.0–100.0)
Platelets: 211 10*3/uL (ref 150–400)
RBC: 4.09 MIL/uL — ABNORMAL LOW (ref 4.22–5.81)
RDW: 12.2 % (ref 11.5–15.5)
WBC: 4.6 10*3/uL (ref 4.0–10.5)
nRBC: 0 % (ref 0.0–0.2)

## 2019-11-28 NOTE — Progress Notes (Addendum)
Anesthesia Review:  PCP: DR Lajean Manes  Cardiologist : Chest x-ray : EKG :11/28/19  Echo : Cardiac Cath :  Sleep Study/ CPAP : Fasting Blood Sugar :      / Checks Blood Sugar -- times a day:   Blood Thinner/ Instructions /Last Dose: ASA / Instructions/ Last Dose :   Patient denies shortness of breath, chest pain, fever, and cough at this phone interview. Patient added on for surgery on 6/10 for surgery on 6/14.  BMP results with Sodium of 130 routed to Dr Kieth Brightly.  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  Patient with hx of hypertension and asthma.

## 2019-11-30 MED ORDER — BUPIVACAINE LIPOSOME 1.3 % IJ SUSP
20.0000 mL | INTRAMUSCULAR | Status: DC
  Filled 2019-11-30: qty 20

## 2019-12-01 ENCOUNTER — Ambulatory Visit (HOSPITAL_COMMUNITY): Payer: Medicare PPO | Admitting: Anesthesiology

## 2019-12-01 ENCOUNTER — Encounter (HOSPITAL_COMMUNITY): Payer: Self-pay | Admitting: General Surgery

## 2019-12-01 ENCOUNTER — Ambulatory Visit (HOSPITAL_COMMUNITY)
Admission: RE | Admit: 2019-12-01 | Discharge: 2019-12-01 | Disposition: A | Discharge status: Home / Self Care | Payer: Medicare PPO | Attending: General Surgery | Admitting: General Surgery

## 2019-12-01 ENCOUNTER — Encounter (HOSPITAL_COMMUNITY)
Admission: RE | Disposition: A | Discharge status: Home / Self Care | Payer: Self-pay | Source: Home / Self Care | Attending: General Surgery

## 2019-12-01 DIAGNOSIS — Z79899 Other long term (current) drug therapy: Secondary | ICD-10-CM | POA: Insufficient documentation

## 2019-12-01 DIAGNOSIS — Z79890 Hormone replacement therapy (postmenopausal): Secondary | ICD-10-CM | POA: Diagnosis not present

## 2019-12-01 DIAGNOSIS — E785 Hyperlipidemia, unspecified: Secondary | ICD-10-CM | POA: Insufficient documentation

## 2019-12-01 DIAGNOSIS — K429 Umbilical hernia without obstruction or gangrene: Secondary | ICD-10-CM | POA: Insufficient documentation

## 2019-12-01 DIAGNOSIS — Z87891 Personal history of nicotine dependence: Secondary | ICD-10-CM | POA: Insufficient documentation

## 2019-12-01 DIAGNOSIS — Z7951 Long term (current) use of inhaled steroids: Secondary | ICD-10-CM | POA: Diagnosis not present

## 2019-12-01 DIAGNOSIS — Z85828 Personal history of other malignant neoplasm of skin: Secondary | ICD-10-CM | POA: Insufficient documentation

## 2019-12-01 DIAGNOSIS — M8580 Other specified disorders of bone density and structure, unspecified site: Secondary | ICD-10-CM | POA: Insufficient documentation

## 2019-12-01 DIAGNOSIS — N400 Enlarged prostate without lower urinary tract symptoms: Secondary | ICD-10-CM | POA: Diagnosis not present

## 2019-12-01 DIAGNOSIS — E039 Hypothyroidism, unspecified: Secondary | ICD-10-CM | POA: Insufficient documentation

## 2019-12-01 DIAGNOSIS — K219 Gastro-esophageal reflux disease without esophagitis: Secondary | ICD-10-CM | POA: Diagnosis not present

## 2019-12-01 DIAGNOSIS — J45909 Unspecified asthma, uncomplicated: Secondary | ICD-10-CM | POA: Insufficient documentation

## 2019-12-01 DIAGNOSIS — I129 Hypertensive chronic kidney disease with stage 1 through stage 4 chronic kidney disease, or unspecified chronic kidney disease: Secondary | ICD-10-CM | POA: Diagnosis not present

## 2019-12-01 DIAGNOSIS — N183 Chronic kidney disease, stage 3 unspecified: Secondary | ICD-10-CM | POA: Diagnosis not present

## 2019-12-01 DIAGNOSIS — K4020 Bilateral inguinal hernia, without obstruction or gangrene, not specified as recurrent: Secondary | ICD-10-CM | POA: Insufficient documentation

## 2019-12-01 DIAGNOSIS — Z888 Allergy status to other drugs, medicaments and biological substances status: Secondary | ICD-10-CM | POA: Insufficient documentation

## 2019-12-01 DIAGNOSIS — I7300 Raynaud's syndrome without gangrene: Secondary | ICD-10-CM | POA: Diagnosis not present

## 2019-12-01 DIAGNOSIS — I10 Essential (primary) hypertension: Secondary | ICD-10-CM | POA: Diagnosis not present

## 2019-12-01 HISTORY — DX: Cardiac arrhythmia, unspecified: I49.9

## 2019-12-01 HISTORY — DX: Essential (primary) hypertension: I10

## 2019-12-01 HISTORY — DX: Malignant (primary) neoplasm, unspecified: C80.1

## 2019-12-01 HISTORY — PX: INGUINAL HERNIA REPAIR: SHX194

## 2019-12-01 HISTORY — DX: Gastro-esophageal reflux disease without esophagitis: K21.9

## 2019-12-01 HISTORY — DX: Unspecified asthma, uncomplicated: J45.909

## 2019-12-01 HISTORY — DX: Hypothyroidism, unspecified: E03.9

## 2019-12-01 SURGERY — REPAIR, HERNIA, INGUINAL, BILATERAL, LAPAROSCOPIC
Anesthesia: General | Laterality: Bilateral

## 2019-12-01 MED ORDER — ONDANSETRON HCL 4 MG/2ML IJ SOLN
INTRAMUSCULAR | Status: AC
  Filled 2019-12-01: qty 2

## 2019-12-01 MED ORDER — IBUPROFEN 800 MG PO TABS
800.0000 mg | ORAL_TABLET | Freq: Three times a day (TID) | ORAL | 0 refills | Status: DC | PRN
Start: 2019-12-01 — End: 2021-11-28

## 2019-12-01 MED ORDER — CEFAZOLIN SODIUM-DEXTROSE 2-4 GM/100ML-% IV SOLN
2.0000 g | INTRAVENOUS | Status: AC
  Administered 2019-12-01: 2 g via INTRAVENOUS
  Filled 2019-12-01: qty 100

## 2019-12-01 MED ORDER — BUPIVACAINE HCL 0.25 % IJ SOLN
INTRAMUSCULAR | Status: AC
  Filled 2019-12-01: qty 1

## 2019-12-01 MED ORDER — ACETAMINOPHEN 500 MG PO TABS
1000.0000 mg | ORAL_TABLET | Freq: Once | ORAL | Status: AC
  Administered 2019-12-01: 1000 mg via ORAL
  Filled 2019-12-01: qty 2

## 2019-12-01 MED ORDER — FENTANYL CITRATE (PF) 100 MCG/2ML IJ SOLN
INTRAMUSCULAR | Status: AC
  Filled 2019-12-01: qty 2

## 2019-12-01 MED ORDER — DEXAMETHASONE SODIUM PHOSPHATE 10 MG/ML IJ SOLN
INTRAMUSCULAR | Status: AC
  Filled 2019-12-01: qty 1

## 2019-12-01 MED ORDER — ROCURONIUM BROMIDE 10 MG/ML (PF) SYRINGE
PREFILLED_SYRINGE | INTRAVENOUS | Status: DC | PRN
  Administered 2019-12-01: 80 mg via INTRAVENOUS

## 2019-12-01 MED ORDER — KETOROLAC TROMETHAMINE 15 MG/ML IJ SOLN
15.0000 mg | INTRAMUSCULAR | Status: AC
  Administered 2019-12-01: 15 mg via INTRAVENOUS
  Filled 2019-12-01: qty 1

## 2019-12-01 MED ORDER — PROMETHAZINE HCL 25 MG/ML IJ SOLN: 6.2500 mg | INTRAMUSCULAR | Status: DC | PRN

## 2019-12-01 MED ORDER — PROPOFOL 10 MG/ML IV BOLUS
INTRAVENOUS | Status: DC | PRN
  Administered 2019-12-01: 150 mg via INTRAVENOUS

## 2019-12-01 MED ORDER — LIDOCAINE 2% (20 MG/ML) 5 ML SYRINGE
INTRAMUSCULAR | Status: DC | PRN
  Administered 2019-12-01: 60 mg via INTRAVENOUS

## 2019-12-01 MED ORDER — ROCURONIUM BROMIDE 10 MG/ML (PF) SYRINGE
PREFILLED_SYRINGE | INTRAVENOUS | Status: AC
  Filled 2019-12-01: qty 10

## 2019-12-01 MED ORDER — CHLORHEXIDINE GLUCONATE CLOTH 2 % EX PADS: 6.0000 | MEDICATED_PAD | Freq: Once | CUTANEOUS | Status: DC

## 2019-12-01 MED ORDER — BUPIVACAINE LIPOSOME 1.3 % IJ SUSP
INTRAMUSCULAR | Status: DC | PRN
  Administered 2019-12-01: 20 mL

## 2019-12-01 MED ORDER — SUGAMMADEX SODIUM 200 MG/2ML IV SOLN
INTRAVENOUS | Status: DC | PRN
  Administered 2019-12-01: 150 mg via INTRAVENOUS

## 2019-12-01 MED ORDER — IRBESARTAN 75 MG PO TABS
75.0000 mg | ORAL_TABLET | Freq: Every day | ORAL | Status: DC
  Administered 2019-12-01: 75 mg via ORAL
  Filled 2019-12-01: qty 1

## 2019-12-01 MED ORDER — LACTATED RINGERS IR SOLN
Status: DC | PRN
  Administered 2019-12-01: 1000 mL

## 2019-12-01 MED ORDER — 0.9 % SODIUM CHLORIDE (POUR BTL) OPTIME
TOPICAL | Status: DC | PRN
  Administered 2019-12-01: 1000 mL

## 2019-12-01 MED ORDER — LIDOCAINE 2% (20 MG/ML) 5 ML SYRINGE
INTRAMUSCULAR | Status: AC
  Filled 2019-12-01: qty 5

## 2019-12-01 MED ORDER — DEXAMETHASONE SODIUM PHOSPHATE 10 MG/ML IJ SOLN
INTRAMUSCULAR | Status: DC | PRN
  Administered 2019-12-01: 8 mg via INTRAVENOUS

## 2019-12-01 MED ORDER — PROPOFOL 10 MG/ML IV BOLUS
INTRAVENOUS | Status: AC
  Filled 2019-12-01: qty 20

## 2019-12-01 MED ORDER — ACETAMINOPHEN 500 MG PO TABS: 1000.0000 mg | ORAL_TABLET | ORAL | Status: DC

## 2019-12-01 MED ORDER — BUPIVACAINE HCL 0.25 % IJ SOLN
INTRAMUSCULAR | Status: DC | PRN
  Administered 2019-12-01: 30 mL

## 2019-12-01 MED ORDER — LACTATED RINGERS IV SOLN: INTRAVENOUS | Status: DC

## 2019-12-01 MED ORDER — CHLORHEXIDINE GLUCONATE 0.12 % MT SOLN
15.0000 mL | Freq: Once | OROMUCOSAL | Status: AC
  Administered 2019-12-01: 15 mL via OROMUCOSAL

## 2019-12-01 MED ORDER — ONDANSETRON HCL 4 MG/2ML IJ SOLN
INTRAMUSCULAR | Status: DC | PRN
  Administered 2019-12-01: 4 mg via INTRAVENOUS

## 2019-12-01 MED ORDER — GLYCOPYRROLATE PF 0.2 MG/ML IJ SOSY
PREFILLED_SYRINGE | INTRAMUSCULAR | Status: DC | PRN
Start: 2019-12-01 — End: 2019-12-01
  Administered 2019-12-01: .2 mg via INTRAVENOUS

## 2019-12-01 MED ORDER — OXYCODONE HCL 5 MG PO TABS
5.0000 mg | ORAL_TABLET | Freq: Four times a day (QID) | ORAL | 0 refills | Status: DC | PRN
Start: 2019-12-01 — End: 2021-11-27

## 2019-12-01 MED ORDER — CELECOXIB 200 MG PO CAPS
200.0000 mg | ORAL_CAPSULE | Freq: Once | ORAL | Status: AC
  Administered 2019-12-01: 200 mg via ORAL
  Filled 2019-12-01: qty 1

## 2019-12-01 MED ORDER — ENSURE PRE-SURGERY PO LIQD
296.0000 mL | Freq: Once | ORAL | Status: DC
  Filled 2019-12-01: qty 296

## 2019-12-01 MED ORDER — FENTANYL CITRATE (PF) 100 MCG/2ML IJ SOLN
INTRAMUSCULAR | Status: DC | PRN
  Administered 2019-12-01: 100 ug via INTRAVENOUS
  Administered 2019-12-01 (×2): 50 ug via INTRAVENOUS

## 2019-12-01 MED ORDER — FENTANYL CITRATE (PF) 100 MCG/2ML IJ SOLN: 25.0000 ug | INTRAMUSCULAR | Status: DC | PRN

## 2019-12-01 MED ORDER — KETOROLAC TROMETHAMINE 30 MG/ML IJ SOLN
INTRAMUSCULAR | Status: AC
  Administered 2019-12-01: 15 mg
  Filled 2019-12-01: qty 1

## 2019-12-01 SURGICAL SUPPLY — 39 items
APPLIER CLIP 5 13 M/L LIGAMAX5 (MISCELLANEOUS)
BENZOIN TINCTURE PRP APPL 2/3 (GAUZE/BANDAGES/DRESSINGS) ×3 IMPLANT
BNDG ADH 1X3 SHEER STRL LF (GAUZE/BANDAGES/DRESSINGS) ×9 IMPLANT
CABLE HIGH FREQUENCY MONO STRZ (ELECTRODE) ×3 IMPLANT
CHLORAPREP W/TINT 26 (MISCELLANEOUS) ×3 IMPLANT
CLIP APPLIE 5 13 M/L LIGAMAX5 (MISCELLANEOUS) IMPLANT
CLOSURE WOUND 1/2 X4 (GAUZE/BANDAGES/DRESSINGS) ×1
COVER SURGICAL LIGHT HANDLE (MISCELLANEOUS) ×3 IMPLANT
COVER WAND RF STERILE (DRAPES) IMPLANT
DECANTER SPIKE VIAL GLASS SM (MISCELLANEOUS) ×3 IMPLANT
DEVICE SECURE STRAP 25 ABSORB (INSTRUMENTS) ×3 IMPLANT
DRAIN CHANNEL 19F RND (DRAIN) IMPLANT
ELECT REM PT RETURN 15FT ADLT (MISCELLANEOUS) ×3 IMPLANT
EVACUATOR SILICONE 100CC (DRAIN) IMPLANT
GLOVE BIO SURGEON STRL SZ7 (GLOVE) ×3 IMPLANT
GLOVE BIOGEL PI IND STRL 7.0 (GLOVE) ×1 IMPLANT
GLOVE BIOGEL PI INDICATOR 7.0 (GLOVE) ×2
GLOVE SURG SS PI 7.0 STRL IVOR (GLOVE) ×3 IMPLANT
GOWN STRL REUS W/TWL LRG LVL3 (GOWN DISPOSABLE) ×9 IMPLANT
GOWN STRL REUS W/TWL XL LVL3 (GOWN DISPOSABLE) ×3 IMPLANT
KIT BASIN (CUSTOM PROCEDURE TRAY) ×3 IMPLANT
KIT TURNOVER KIT A (KITS) IMPLANT
MESH 3DMAX 4X6 LT LRG (Mesh General) ×3 IMPLANT
MESH 3DMAX 4X6 RT LRG (Mesh General) ×3 IMPLANT
SCISSORS LAP 5X35 DISP (ENDOMECHANICALS) ×3 IMPLANT
SET IRRIG TUBING LAPAROSCOPIC (IRRIGATION / IRRIGATOR) ×3 IMPLANT
SET TUBE SMOKE EVAC HIGH FLOW (TUBING) ×3 IMPLANT
SLEEVE XCEL OPT CAN 5 100 (ENDOMECHANICALS) ×3 IMPLANT
STRIP CLOSURE SKIN 1/2X4 (GAUZE/BANDAGES/DRESSINGS) ×2 IMPLANT
SUT ETHILON 2 0 PS N (SUTURE) IMPLANT
SUT MNCRL AB 4-0 PS2 18 (SUTURE) ×3 IMPLANT
SUT PDS AB 0 CT1 36 (SUTURE) ×3 IMPLANT
SUT VICRYL 0 TIES 12 18 (SUTURE) ×3 IMPLANT
SUT VICRYL 0 UR6 27IN ABS (SUTURE) IMPLANT
TOWEL OR 17X26 10 PK STRL BLUE (TOWEL DISPOSABLE) ×3 IMPLANT
TRAY FOLEY MTR SLVR 16FR STAT (SET/KITS/TRAYS/PACK) IMPLANT
TRAY LAPAROSCOPIC (CUSTOM PROCEDURE TRAY) ×3 IMPLANT
TROCAR BLADELESS OPT 5 100 (ENDOMECHANICALS) ×3 IMPLANT
TROCAR XCEL 12X100 BLDLESS (ENDOMECHANICALS) ×3 IMPLANT

## 2019-12-01 NOTE — Anesthesia Procedure Notes (Signed)
Procedure Name: Intubation Date/Time: 12/01/2019 2:09 PM Performed by: Niel Hummer, CRNA Pre-anesthesia Checklist: Patient identified, Emergency Drugs available, Patient being monitored and Suction available Patient Re-evaluated:Patient Re-evaluated prior to induction Oxygen Delivery Method: Circle system utilized Preoxygenation: Pre-oxygenation with 100% oxygen Induction Type: IV induction Ventilation: Mask ventilation without difficulty and Oral airway inserted - appropriate to patient size Laryngoscope Size: Mac and 4 Grade View: Grade II Tube type: Oral Tube size: 7.5 mm Number of attempts: 1 Airway Equipment and Method: Stylet Placement Confirmation: ETT inserted through vocal cords under direct vision,  positive ETCO2 and breath sounds checked- equal and bilateral Secured at: 23 cm Tube secured with: Tape Dental Injury: Teeth and Oropharynx as per pre-operative assessment

## 2019-12-01 NOTE — Transfer of Care (Signed)
Immediate Anesthesia Transfer of Care Note  Patient: Michael Bates  Procedure(s) Performed: LAPAROSCOPIC BILATERAL INGUINAL HERNIA REPAIR WITH MESH,UMBILICAL HERNIA REPAIR (Bilateral )  Patient Location: PACU  Anesthesia Type:General  Level of Consciousness: awake, alert  and oriented  Airway & Oxygen Therapy: Patient Spontanous Breathing and Patient connected to face mask oxygen  Post-op Assessment: Report given to RN, Post -op Vital signs reviewed and stable and Patient moving all extremities X 4  Post vital signs: Reviewed and stable  Last Vitals:  Vitals Value Taken Time  BP 156/82 12/01/19 1530  Temp    Pulse 60 12/01/19 1530  Resp 10 12/01/19 1530  SpO2 100 % 12/01/19 1530  Vitals shown include unvalidated device data.  Last Pain:  Vitals:   12/01/19 1327  TempSrc: Oral         Complications: No complications documented.

## 2019-12-01 NOTE — Op Note (Signed)
Preop diagnosis: bilateral inguinal hernia, umbilical hernia  Postop diagnosis: bilateral inguinal hernia, umbilical hernia  Procedure: laparoscopic Bilateral inguinal hernia repair with mesh, primary open umbilical hernia repair  Surgeon: Gurney Maxin, M.D.  Asst: none  Anesthesia: Gen.   Indications for procedure: Michael Bates is a 79 y.o. male with symptoms of pain and enlarging Bilateral inguinal hernia(s). After discussing risks, alternatives and benefits he decided on laparoscopic repair and was brought to day surgery for repair.  Description of procedure: The patient was brought into the operative suite, placed supine. Anesthesia was administered with endotracheal tube. Patient was strapped in place. The patient was prepped and draped in the usual sterile fashion.  Next an infraumbilical incision was made. Dissection was used to visualize the left anterior rectus sheath. Anterior rectus sheath was sharply incised, next to the 12 mm trocar was inserted into the preperitoneal space. Laparoscope was inserted to confirm placement and then used to bluntly dissect the retrorectus space clear. Once the midline ws free 2 47mm trocars were placed. 1 in the suprapubic area and 1in 5cm superior to the first.  On initial visualization, there was a moderate indirect left and right inguinal hernias. Next I began our dissection on the right identifying the ASIS laterally and then working back medially removing the filmy tissue and adhesions of the peritoneum to the abdominal wall. Hernia sac was completely dissected out of the canal. Vas deference and contents of the cord were safely dissected away of the hernia sac. The Exparel mix was infused along the transversus abdominis planes laterally and near the lacunar ligament medially.  Next I began our dissection on the left identifying the ASIS laterally and then working back medially removing the filmy tissue and adhesions of the peritoneum to the  abdominal wall. Hernia sac was completely dissected out of the canal. Vas deference and contents of the cord were safely dissected away of the hernia sac. The Exparel mix was infused along the transversus abdominis planes laterally and near the lacunar ligament medially.  Cautery was used for one spot of bleeding at the right medial area.  A large right 3D max mesh was inserted and tacked medially to the lacunar ligament. One additional tack was used in the anterior space. A large left 3D max mesh was inserted and tacked medially to the lacunar ligament. One additional tack was used in the anterior space. The mesh was positioned flat and directly up against the direct and indirect areas. Irrigation was used to remove blood. Hemostasis was intact.  The CO2 was evacuated while watching to ensure the mesh did not migrate.  The umbilical stalk was bluntly dissected free of the surrounding tissue. The skin was separated from the hernia sac. The hernia was small and reduced. 2 0 PDS was used to appose the fascia in interrupted fashion. The skin was sutured to the fascia with 0 vicryl. The anterior rectus fascia was closed with 0 vicryl in interrupted sutures and all skin incisions were closed with 4-0 monocryl subcu stitch. The patient awoke from anesthesia and was brought to PACU in stable condition.  Findings: bilateral indirect inguinal hernia, small umbilical hernia  Specimen: none  Blood loss: 20 ml  Local anesthesia: 50 ml Exparel:Marcaine mix  Complications: none  Implant: Right and Left Bard 3D max mesh  Gurney Maxin, M.D. General, Bariatric, & Minimally Invasive Surgery [facility] 3:16 PM 12/01/2019

## 2019-12-01 NOTE — H&P (Signed)
Michael Bates is an 79 y.o. male.   Chief Complaint: hernias HPI: 79 yo male with bilateral symptomatic inguinal hernias. His right hernia has gotten larger in the last few months. On exam he also has a small umbilical hernia that is asymptomatic.  Past Medical History:  Diagnosis Date  . Asthma   . BPH (benign prostatic hyperplasia)   . Cancer (HCC)    hx of skin cancer   . Degenerative arthritis of cervical spine    c5-c6  . Dysrhythmia    hx of pvcs in the past   . Family history of adverse reaction to anesthesia    Father had idosyncratic narcotic  reaction  . GERD (gastroesophageal reflux disease)    occ   . Hyperlipidemia   . Hypertension   . Hypothyroidism   . Osteopenia   . Raynaud disease   . Renal disease    stage 3, patient not aware of this diagnosis  . Thyroid disease     Past Surgical History:  Procedure Laterality Date  . COLONOSCOPY WITH PROPOFOL N/A 01/10/2016   Procedure: COLONOSCOPY WITH PROPOFOL;  Surgeon: Garlan Fair, MD;  Location: [facility];  Service: Endoscopy;  Laterality: N/A;  . HEMORROIDECTOMY    . PAROTIDECTOMY  2017   Dr. Redmond Baseman  . PROSTATECTOMY      History reviewed. No pertinent family history. Social History:  reports that he quit smoking about 42 years ago. His smoking use included cigarettes and pipe. He has a 20.00 pack-year smoking history. He has never used smokeless tobacco. He reports current alcohol use of about 7.0 standard drinks of alcohol per week. He reports that he does not use drugs.  Allergies:  Allergies  Allergen Reactions  . Tape Other (See Comments)    RED IRRITATION IF LEFT ON LONG TIME    Medications Prior to Admission  Medication Sig Dispense Refill  . albuterol (PROVENTIL HFA;VENTOLIN HFA) 108 (90 BASE) MCG/ACT inhaler Inhale 2 puffs into the lungs See admin instructions. Inhale 2 puffs every morning, may inhale 2 puffs every 6 hours as needed for shortness of breath    . beclomethasone (QVAR) 80  MCG/ACT inhaler Inhale 1 puff into the lungs 2 (two) times daily.    Marland Kitchen Bioflavonoid Products (ESTER C PO) Take 500 mg by mouth daily.    Marland Kitchen buPROPion (WELLBUTRIN XL) 300 MG 24 hr tablet Take 300 mg by mouth daily.    . Cholecalciferol (VITAMIN D3) 5000 units CAPS Take 5,000 Units by mouth daily.    Marland Kitchen CINNAMON PO Take 2 tablets by mouth every evening.    . Coenzyme Q10 300 MG CAPS Take 300 mg by mouth daily.    . Collagen-Boron-Hyaluronic Acid ([employer] JOINT HEALTH TRIPLE ACTION PO) Take 1 tablet by mouth daily.    Marland Kitchen ibuprofen (ADVIL) 200 MG tablet Take 200 mg by mouth every 6 (six) hours as needed for headache or moderate pain.    Marland Kitchen irbesartan (AVAPRO) 75 MG tablet Take 75 mg by mouth at bedtime.    Marland Kitchen Ketotifen Fumarate (ALAWAY OP) Place 1 drop into both eyes daily as needed (allergies).    Marland Kitchen levothyroxine (SYNTHROID, LEVOTHROID) 200 MCG tablet Take 200 mcg by mouth every morning.    . methylphenidate 18 MG PO CR tablet Take 18 mg by mouth daily.    . mirabegron ER (MYRBETRIQ) 50 MG TB24 tablet Take 50 mg by mouth daily.    . Multiple Vitamins-Minerals (CENTRUM ULTRA MENS PO) Take 1 tablet  by mouth daily.     . Multiple Vitamins-Minerals (LUTEIN-ZEAXANTHIN PO) Take 1 tablet by mouth every evening.    . Omega-3 Fatty Acids (OMEGA 3 500 PO) Take 500 mg by mouth daily.    . rosuvastatin (CRESTOR) 5 MG tablet Take 5 mg by mouth daily.    . tadalafil (CIALIS) 5 MG tablet Take 5 mg by mouth daily.     . TURMERIC CURCUMIN PO Take 1,000 mg by mouth 2 (two) times daily.     Marland Kitchen acetaminophen (TYLENOL) 500 MG tablet Take 500 mg by mouth every 6 (six) hours as needed for moderate pain or headache.      No results found for this or any previous visit (from the past 48 hour(s)). No results found.  Review of Systems  Constitutional: Negative for chills and fever.  HENT: Negative for hearing loss.   Respiratory: Negative for cough.   Cardiovascular: Negative for chest pain and palpitations.   Gastrointestinal: Negative for abdominal pain, nausea and vomiting.  Genitourinary: Negative for dysuria and urgency.  Musculoskeletal: Negative for myalgias and neck pain.  Skin: Negative for rash.  Neurological: Negative for dizziness and headaches.  Hematological: Does not bruise/bleed easily.  Psychiatric/Behavioral: Negative for suicidal ideas.    Blood pressure (!) 176/77, pulse (!) 53, temperature 98.5 F (36.9 C), temperature source Oral, resp. rate 16, SpO2 100 %. Physical Exam  Vitals reviewed. Constitutional: He is oriented to person, place, and time. He appears well-developed.  HENT:  Head: Normocephalic and atraumatic.  Eyes: Pupils are equal, round, and reactive to light. Conjunctivae are normal.  Cardiovascular: Normal rate and regular rhythm.  Respiratory: Effort normal and breath sounds normal.  GI: Soft. Bowel sounds are normal. He exhibits no distension. There is no abdominal tenderness.  Bilateral inguinal hernias, small umbilical hernia  Musculoskeletal:        General: Normal range of motion.     Cervical back: Normal range of motion and neck supple.  Neurological: He is alert and oriented to person, place, and time.  Skin: Skin is warm and dry.  Psychiatric: His behavior is normal.     Assessment/Plan 79 yo male with umbilical hernia and bilateral inguinal hernias. -lap bilateral inguinal hernia repair with mesh, open primary umbilical hernia -planned outpatient procedure  Mickeal Skinner, MD 12/01/2019, 1:52 PM

## 2019-12-01 NOTE — Anesthesia Preprocedure Evaluation (Addendum)
Anesthesia Evaluation  Patient identified by MRN, date of birth, ID band Patient awake    Reviewed: Allergy & Precautions, NPO status , Patient's Chart, lab work & pertinent test results  History of Anesthesia Complications Negative for: history of anesthetic complications  Airway Mallampati: I  TM Distance: >3 FB Neck ROM: Full    Dental   Pulmonary asthma , former smoker,    Pulmonary exam normal        Cardiovascular hypertension, Pt. on medications Normal cardiovascular exam     Neuro/Psych negative neurological ROS     GI/Hepatic Neg liver ROS, GERD  Medicated and Controlled,  Endo/Other  Hypothyroidism   Renal/GU negative Renal ROS     Musculoskeletal negative musculoskeletal ROS (+)   Abdominal   Peds  Hematology negative hematology ROS (+)   Anesthesia Other Findings   Reproductive/Obstetrics                            Anesthesia Physical Anesthesia Plan  ASA: II  Anesthesia Plan: General   Post-op Pain Management:    Induction: Intravenous  PONV Risk Score and Plan: 4 or greater and Ondansetron, Dexamethasone, Midazolam and Diphenhydramine  Airway Management Planned: Oral ETT  Additional Equipment:   Intra-op Plan:   Post-operative Plan: Extubation in OR  Informed Consent: I have reviewed the patients History and Physical, chart, labs and discussed the procedure including the risks, benefits and alternatives for the proposed anesthesia with the patient or authorized representative who has indicated his/her understanding and acceptance.       Plan Discussed with: CRNA and Surgeon  Anesthesia Plan Comments:        Anesthesia Quick Evaluation

## 2019-12-02 ENCOUNTER — Encounter (HOSPITAL_COMMUNITY): Payer: Self-pay | Admitting: General Surgery

## 2019-12-02 NOTE — Anesthesia Postprocedure Evaluation (Signed)
Anesthesia Post Note  Patient: TAQUAN BRALLEY  Procedure(s) Performed: LAPAROSCOPIC BILATERAL INGUINAL HERNIA REPAIR WITH MESH,UMBILICAL HERNIA REPAIR (Bilateral )     Patient location during evaluation: PACU Anesthesia Type: General Level of consciousness: awake and alert Pain management: pain level controlled Vital Signs Assessment: post-procedure vital signs reviewed and stable Respiratory status: spontaneous breathing, nonlabored ventilation, respiratory function stable and patient connected to nasal cannula oxygen Cardiovascular status: blood pressure returned to baseline and stable Postop Assessment: no apparent nausea or vomiting Anesthetic complications: no   No complications documented.  Last Vitals:  Vitals:   12/01/19 1815 12/01/19 1830  BP: (!) 183/83 (!) 169/88  Pulse: (!) 54 (!) 55  Resp:  18  Temp:  (!) 36.4 C  SpO2: 100% 100%    Last Pain:  Vitals:   12/01/19 1830  TempSrc:   PainSc: 0-No pain                 Viney Acocella DAVID

## 2019-12-16 DIAGNOSIS — N400 Enlarged prostate without lower urinary tract symptoms: Secondary | ICD-10-CM | POA: Diagnosis not present

## 2019-12-18 DIAGNOSIS — Z961 Presence of intraocular lens: Secondary | ICD-10-CM | POA: Diagnosis not present

## 2019-12-18 DIAGNOSIS — H10413 Chronic giant papillary conjunctivitis, bilateral: Secondary | ICD-10-CM | POA: Diagnosis not present

## 2019-12-18 DIAGNOSIS — H353131 Nonexudative age-related macular degeneration, bilateral, early dry stage: Secondary | ICD-10-CM | POA: Diagnosis not present

## 2019-12-18 DIAGNOSIS — H0102A Squamous blepharitis right eye, upper and lower eyelids: Secondary | ICD-10-CM | POA: Diagnosis not present

## 2019-12-18 DIAGNOSIS — H0102B Squamous blepharitis left eye, upper and lower eyelids: Secondary | ICD-10-CM | POA: Diagnosis not present

## 2020-01-02 DIAGNOSIS — L905 Scar conditions and fibrosis of skin: Secondary | ICD-10-CM | POA: Diagnosis not present

## 2020-01-02 DIAGNOSIS — L814 Other melanin hyperpigmentation: Secondary | ICD-10-CM | POA: Diagnosis not present

## 2020-01-02 DIAGNOSIS — L570 Actinic keratosis: Secondary | ICD-10-CM | POA: Diagnosis not present

## 2020-01-02 DIAGNOSIS — L821 Other seborrheic keratosis: Secondary | ICD-10-CM | POA: Diagnosis not present

## 2020-01-02 DIAGNOSIS — Z85828 Personal history of other malignant neoplasm of skin: Secondary | ICD-10-CM | POA: Diagnosis not present

## 2020-03-02 ENCOUNTER — Other Ambulatory Visit: Payer: Self-pay | Admitting: Geriatric Medicine

## 2020-03-02 ENCOUNTER — Ambulatory Visit
Admission: RE | Admit: 2020-03-02 | Discharge: 2020-03-02 | Disposition: A | Discharge status: Home / Self Care | Payer: Medicare PPO | Source: Ambulatory Visit | Attending: Geriatric Medicine | Admitting: Geriatric Medicine

## 2020-03-02 ENCOUNTER — Other Ambulatory Visit: Payer: Self-pay

## 2020-03-02 DIAGNOSIS — M546 Pain in thoracic spine: Secondary | ICD-10-CM

## 2020-03-02 DIAGNOSIS — M5134 Other intervertebral disc degeneration, thoracic region: Secondary | ICD-10-CM | POA: Diagnosis not present

## 2020-03-02 DIAGNOSIS — K449 Diaphragmatic hernia without obstruction or gangrene: Secondary | ICD-10-CM | POA: Diagnosis not present

## 2020-03-02 DIAGNOSIS — S2241XA Multiple fractures of ribs, right side, initial encounter for closed fracture: Secondary | ICD-10-CM | POA: Diagnosis not present

## 2020-03-02 DIAGNOSIS — R0781 Pleurodynia: Secondary | ICD-10-CM

## 2020-03-10 DIAGNOSIS — Z20828 Contact with and (suspected) exposure to other viral communicable diseases: Secondary | ICD-10-CM | POA: Diagnosis not present

## 2020-03-22 DIAGNOSIS — Z961 Presence of intraocular lens: Secondary | ICD-10-CM | POA: Diagnosis not present

## 2020-04-05 DIAGNOSIS — D485 Neoplasm of uncertain behavior of skin: Secondary | ICD-10-CM | POA: Diagnosis not present

## 2020-04-05 DIAGNOSIS — L570 Actinic keratosis: Secondary | ICD-10-CM | POA: Diagnosis not present

## 2020-04-05 DIAGNOSIS — Z85828 Personal history of other malignant neoplasm of skin: Secondary | ICD-10-CM | POA: Diagnosis not present

## 2020-04-05 DIAGNOSIS — D2361 Other benign neoplasm of skin of right upper limb, including shoulder: Secondary | ICD-10-CM | POA: Diagnosis not present

## 2020-04-05 DIAGNOSIS — L905 Scar conditions and fibrosis of skin: Secondary | ICD-10-CM | POA: Diagnosis not present

## 2020-05-12 DIAGNOSIS — Z79899 Other long term (current) drug therapy: Secondary | ICD-10-CM | POA: Diagnosis not present

## 2020-05-12 DIAGNOSIS — I10 Essential (primary) hypertension: Secondary | ICD-10-CM | POA: Diagnosis not present

## 2020-07-08 DIAGNOSIS — Z85828 Personal history of other malignant neoplasm of skin: Secondary | ICD-10-CM | POA: Diagnosis not present

## 2020-07-08 DIAGNOSIS — L905 Scar conditions and fibrosis of skin: Secondary | ICD-10-CM | POA: Diagnosis not present

## 2020-07-08 DIAGNOSIS — L570 Actinic keratosis: Secondary | ICD-10-CM | POA: Diagnosis not present

## 2020-07-08 DIAGNOSIS — D485 Neoplasm of uncertain behavior of skin: Secondary | ICD-10-CM | POA: Diagnosis not present

## 2020-08-11 DIAGNOSIS — I10 Essential (primary) hypertension: Secondary | ICD-10-CM | POA: Diagnosis not present

## 2020-10-06 DIAGNOSIS — Z85828 Personal history of other malignant neoplasm of skin: Secondary | ICD-10-CM | POA: Diagnosis not present

## 2020-10-06 DIAGNOSIS — L905 Scar conditions and fibrosis of skin: Secondary | ICD-10-CM | POA: Diagnosis not present

## 2020-10-06 DIAGNOSIS — L821 Other seborrheic keratosis: Secondary | ICD-10-CM | POA: Diagnosis not present

## 2020-10-06 DIAGNOSIS — Q828 Other specified congenital malformations of skin: Secondary | ICD-10-CM | POA: Diagnosis not present

## 2020-10-06 DIAGNOSIS — D485 Neoplasm of uncertain behavior of skin: Secondary | ICD-10-CM | POA: Diagnosis not present

## 2020-10-06 DIAGNOSIS — C44722 Squamous cell carcinoma of skin of right lower limb, including hip: Secondary | ICD-10-CM | POA: Diagnosis not present

## 2020-10-19 DIAGNOSIS — D0471 Carcinoma in situ of skin of right lower limb, including hip: Secondary | ICD-10-CM | POA: Diagnosis not present

## 2020-11-12 DIAGNOSIS — M8580 Other specified disorders of bone density and structure, unspecified site: Secondary | ICD-10-CM | POA: Diagnosis not present

## 2020-11-12 DIAGNOSIS — Z0000 Encounter for general adult medical examination without abnormal findings: Secondary | ICD-10-CM | POA: Diagnosis not present

## 2020-11-12 DIAGNOSIS — E7800 Pure hypercholesterolemia, unspecified: Secondary | ICD-10-CM | POA: Diagnosis not present

## 2020-11-12 DIAGNOSIS — Z79899 Other long term (current) drug therapy: Secondary | ICD-10-CM | POA: Diagnosis not present

## 2020-11-12 DIAGNOSIS — E039 Hypothyroidism, unspecified: Secondary | ICD-10-CM | POA: Diagnosis not present

## 2020-11-12 DIAGNOSIS — E222 Syndrome of inappropriate secretion of antidiuretic hormone: Secondary | ICD-10-CM | POA: Diagnosis not present

## 2020-11-12 DIAGNOSIS — D7589 Other specified diseases of blood and blood-forming organs: Secondary | ICD-10-CM | POA: Diagnosis not present

## 2020-11-12 DIAGNOSIS — F900 Attention-deficit hyperactivity disorder, predominantly inattentive type: Secondary | ICD-10-CM | POA: Diagnosis not present

## 2020-11-12 DIAGNOSIS — Z1389 Encounter for screening for other disorder: Secondary | ICD-10-CM | POA: Diagnosis not present

## 2020-11-12 DIAGNOSIS — I10 Essential (primary) hypertension: Secondary | ICD-10-CM | POA: Diagnosis not present

## 2021-01-03 ENCOUNTER — Encounter (INDEPENDENT_AMBULATORY_CARE_PROVIDER_SITE_OTHER): Payer: Self-pay

## 2021-01-03 ENCOUNTER — Encounter (INDEPENDENT_AMBULATORY_CARE_PROVIDER_SITE_OTHER): Payer: Self-pay | Admitting: Ophthalmology

## 2021-01-03 ENCOUNTER — Encounter (INDEPENDENT_AMBULATORY_CARE_PROVIDER_SITE_OTHER): Payer: Medicare PPO | Admitting: Ophthalmology

## 2021-01-03 ENCOUNTER — Ambulatory Visit (INDEPENDENT_AMBULATORY_CARE_PROVIDER_SITE_OTHER): Payer: Medicare PPO | Admitting: Ophthalmology

## 2021-01-03 ENCOUNTER — Ambulatory Visit: Payer: Medicare PPO

## 2021-01-03 ENCOUNTER — Other Ambulatory Visit: Payer: Self-pay

## 2021-01-03 DIAGNOSIS — R0683 Snoring: Secondary | ICD-10-CM | POA: Insufficient documentation

## 2021-01-03 DIAGNOSIS — H353132 Nonexudative age-related macular degeneration, bilateral, intermediate dry stage: Secondary | ICD-10-CM | POA: Insufficient documentation

## 2021-01-03 NOTE — Patient Instructions (Signed)
The nature of age realated macular degeneration (ARMD)is explained as follows: The dry form refers to the progressive loss of normal blood supply to the central vision as a result of a combination of factors which include aging blood supply (arteriosclerosis, hardening of the arteries), genetics, smoking habits, and history of hypertension. Currently, no eye medications or vitamins slow this type of aging effect upon vision, however cessation of smoking and controlling hypertension help slow the disorder. The following analogy helps explain this: I describe the dry form of ARMD like a house of the same age as your eyes, which shows typical wear and tear of age upon the house structure and appearance. Like the aging house which can fall down structurally, the dry form of ARMD can deteriorate the structure of the macula (center) of the retina, most often gradually and affect central vision tasks such as reading and driving. The wet form of ARMD refers to the development of abnormally growing blood vessels usually near or under the central vision, with potential risk of permanent visual changes or vision losses. This complication of the Dry form of ARMD may be moderately reduced by use of AREDS 2 formula multivitamins daily. I describe the Wet form of ARMD as like the development of a fire in an aging house (DRY ARMD). It may develop suddenly, progress rapidly and be destructive based on where it starts and how big the fire is when found. In the eye, the house fire analogy refers to the abnormal blood vessels growing destructively near the central vison in the retina, or film of the eye. Halting the growth of blood vessels with laser (hot or cold) or injectable medications is best way "to put the fire out". Many patients will experience a stabilization or even improvement in vision with a treatment course, while others may still face a loss of vision. The use of injectable medications has revolutionized therapy and is  currently the only proven therapy to provide the chance of stable or improved acuity from new and recent destructive wet ARMD. Age-Related Macular Degeneration  Age-related macular degeneration (AMD) is an eye disease related to aging. The disease causes a loss of central vision. Central vision allows a person to seeobjects clearly and do daily tasks like reading and driving. There are two main types of AMD: Dry AMD. People with this type generally lose their vision slowly. This is the most common type of AMD. Some people with dry AMD notice very little change in their vision as they age. Wet AMD. People with this type can lose their vision quickly. What are the causes? This condition is caused by damage to the part of the eye that provides you with central vision (macula). Dry AMD happens when deposits in the macula cause light-sensitive cells to slowly break down. Wet AMD happens when abnormal blood vessels grow under the macula and leak blood and fluid. What increases the risk? You are more likely to develop this condition if you: Are 15 years old or older, and especially 18 years old or older. Smoke. Are obese. Have a family history of AMD. Have high cholesterol, high blood pressure, or heart disease. Have been exposed to high levels of ultraviolet (UV) light and blue light. Are white (Caucasian). Are male. What are the signs or symptoms? Common symptoms of this condition include: Blurred vision, especially when reading print material. The blurred vision often improves in brighter light. A blurred or blind spot in the center of your field of vision that is  small but growing larger. Bright colors seeming less bright than they used to be. Decreased ability to recognize and see faces. One eye seeing worse than the other. Decreased ability to adapt to dimly lit rooms. Straight lines appearing crooked or wavy. How is this diagnosed? This condition is diagnosed based on your symptoms  and an eye exam. During the eye exam: Eye drops will be placed into your eyes to enlarge (dilate) your pupils. This will allow your health care provider to see the back of your eye. You may be asked to look at an image that looks like a checkerboard (Amsler grid). Early changes in your central vision may cause the grid to appear distorted. After the exam, you may be given one or both of these tests: Fluorescein angiogram. This test determines whether you have dry or wet AMD. Optical coherence tomography (OCT) test to evaluate deep layers of the retina. How is this treated? There is no cure for this condition, but treatment can help to slow down progression of the disease. This condition may be treated with: Supplements, including vitamin C, vitamin E, beta carotene, and zinc. Laser surgery to destroy new blood vessels or leaking blood vessels in your eye. Injections of medicines into your eye to slow down the formation of abnormal blood vessels that may leak. These injections may need to be repeated on a routine basis. Follow these instructions at home: Take over-the-counter and prescription medicines only as told by your health care provider. Take vitamins and supplements as told by your health care provider. Ask your health care provider for an Amsler grid. Use it every day to check each eye for vision changes. Get an eye exam as often as told by your health care provider. Make sure to get an eye exam at least once every year. Keep all follow-up visits as told by your health care provider. This is important. Contact a health care provider if: You notice any new changes in your vision. Get help right away if: You suddenly lose vision or develop pain in the eye. Summary Age-related macular degeneration (AMD) is an eye disease related to aging. There are two types of this condition: dry AMD and wet AMD. This condition is caused by damage to the part of the eye that provides you with central  vision (macula). Once diagnosed with AMD, make sure to get an eye exam every year, take supplements and vitamins as directed, use an Amsler grid at home, and follow up with your health care provider. This information is not intended to replace advice given to you by your health care provider. Make sure you discuss any questions you have with your healthcare provider. Document Revised: 12/12/2017 Document Reviewed: 12/12/2017 Elsevier Patient Education  [place].

## 2021-01-03 NOTE — Assessment & Plan Note (Signed)

## 2021-01-03 NOTE — Progress Notes (Signed)
01/03/2021     CHIEF COMPLAINT Patient presents for Retina Evaluation (NP- Macular Degeneration /Pt states, "I was told by Dr. Katy Fitch about a year ago that he had drusen but he is concerned because he had a mother who lost her vision to AMD. I am having a very hard time trying to dark adapt in my central vision. I am having a hard time being able to distinguish figure from ground."/Denies any FOL/Floaters/)   HISTORY OF PRESENT ILLNESS: Michael Bates is a 80 y.o. male who presents to the clinic today for:   HPI     Retina Evaluation           Laterality: both eyes   Onset: 1 year ago   Duration: 1 year   Comments: NP- Macular Degeneration  Pt states, "I was told by Dr. Katy Fitch about a year ago that he had drusen but he is concerned because he had a mother who lost her vision to AMD. I am having a very hard time trying to dark adapt in my central vision. I am having a hard time being able to distinguish figure from ground." Denies any FOL/Floaters        Last edited by Kendra Opitz, COA on 01/03/2021  3:35 PM.      Referring physician: Lajean Manes, MD [address] [place],  [place] [address]  HISTORICAL INFORMATION:   Selected notes from the MEDICAL RECORD NUMBER       CURRENT MEDICATIONS: Current Outpatient Medications (Ophthalmic Drugs)  Medication Sig   Ketotifen Fumarate (ALAWAY OP) Place 1 drop into both eyes daily as needed (allergies).   No current facility-administered medications for this visit. (Ophthalmic Drugs)   Current Outpatient Medications (Other)  Medication Sig   acetaminophen (TYLENOL) 500 MG tablet Take 500 mg by mouth every 6 (six) hours as needed for moderate pain or headache.   albuterol (PROVENTIL HFA;VENTOLIN HFA) 108 (90 BASE) MCG/ACT inhaler Inhale 2 puffs into the lungs See admin instructions. Inhale 2 puffs every morning, may inhale 2 puffs every 6 hours as needed for shortness of breath   beclomethasone (QVAR) 80  MCG/ACT inhaler Inhale 1 puff into the lungs 2 (two) times daily.   Bioflavonoid Products (ESTER C PO) Take 500 mg by mouth daily.   buPROPion (WELLBUTRIN XL) 300 MG 24 hr tablet Take 300 mg by mouth daily.   Cholecalciferol (VITAMIN D3) 5000 units CAPS Take 5,000 Units by mouth daily.   CINNAMON PO Take 2 tablets by mouth every evening.   Coenzyme Q10 300 MG CAPS Take 300 mg by mouth daily.   Collagen-Boron-Hyaluronic Acid ([employer] JOINT HEALTH TRIPLE ACTION PO) Take 1 tablet by mouth daily.   ibuprofen (ADVIL) 800 MG tablet Take 1 tablet (800 mg total) by mouth every 8 (eight) hours as needed.   irbesartan (AVAPRO) 75 MG tablet Take 75 mg by mouth at bedtime.   levothyroxine (SYNTHROID, LEVOTHROID) 200 MCG tablet Take 200 mcg by mouth every morning.   methylphenidate 18 MG PO CR tablet Take 18 mg by mouth daily.   mirabegron ER (MYRBETRIQ) 50 MG TB24 tablet Take 50 mg by mouth daily.   Multiple Vitamins-Minerals (CENTRUM ULTRA MENS PO) Take 1 tablet by mouth daily.    Multiple Vitamins-Minerals (LUTEIN-ZEAXANTHIN PO) Take 1 tablet by mouth every evening.   Omega-3 Fatty Acids (OMEGA 3 500 PO) Take 500 mg by mouth daily.   oxyCODONE (OXY IR/ROXICODONE) 5 MG immediate release tablet Take 1  tablet (5 mg total) by mouth every 6 (six) hours as needed for severe pain.   rosuvastatin (CRESTOR) 5 MG tablet Take 5 mg by mouth daily.   tadalafil (CIALIS) 5 MG tablet Take 5 mg by mouth daily.    TURMERIC CURCUMIN PO Take 1,000 mg by mouth 2 (two) times daily.    No current facility-administered medications for this visit. (Other)      REVIEW OF SYSTEMS:    ALLERGIES Allergies  Allergen Reactions   Tape Other (See Comments)    RED IRRITATION IF LEFT ON LONG TIME    PAST MEDICAL HISTORY Past Medical History:  Diagnosis Date   Asthma    BPH (benign prostatic hyperplasia)    Cancer (HCC)    hx of skin cancer    Degenerative arthritis of cervical spine    c5-c6   Dysrhythmia    hx of pvcs  in the past    Family history of adverse reaction to anesthesia    Father had idosyncratic narcotic  reaction   GERD (gastroesophageal reflux disease)    occ    Hyperlipidemia    Hypertension    Hypothyroidism    Osteopenia    Raynaud disease    Renal disease    stage 3, patient not aware of this diagnosis   Thyroid disease    Past Surgical History:  Procedure Laterality Date   COLONOSCOPY WITH PROPOFOL N/A 01/10/2016   Procedure: COLONOSCOPY WITH PROPOFOL;  Surgeon: Garlan Fair, MD;  Location: [facility];  Service: Endoscopy;  Laterality: N/A;   HEMORROIDECTOMY     INGUINAL HERNIA REPAIR Bilateral 12/01/2019   Procedure: LAPAROSCOPIC BILATERAL INGUINAL HERNIA REPAIR WITH MESH,UMBILICAL HERNIA REPAIR;  Surgeon: Kieth Brightly, Arta Bruce, MD;  Location: [facility];  Service: General;  Laterality: Bilateral;   PAROTIDECTOMY  2017   Dr. Redmond Baseman   PROSTATECTOMY      FAMILY HISTORY History reviewed. No pertinent family history.  SOCIAL HISTORY Social History   Tobacco Use   Smoking status: Former    Packs/day: 1.00    Years: 20.00    Pack years: 20.00    Types: Cigarettes, Pipe    Quit date: 1979    Years since quitting: 43.5   Smokeless tobacco: Never  Vaping Use   Vaping Use: Never used  Substance Use Topics   Alcohol use: Yes    Alcohol/week: 7.0 standard drinks    Types: 7 Glasses of wine per week   Drug use: No         OPHTHALMIC EXAM:  Base Eye Exam     Visual Acuity (ETDRS)       Right Left   Dist cc 20/25 +2 20/25 +2    Correction: Glasses         Tonometry (Tonopen, 3:39 PM)       Right Left   Pressure 19 21         Pupils       Pupils Dark Light Shape React APD   Right PERRL 4 4 Round Minimal None   Left PERRL 4 3 Round Brisk None         Visual Fields (Counting fingers)       Left Right    Full Full         Extraocular Movement       Right Left    Full Full         Neuro/Psych     Oriented x3: Yes  Dilation     Both eyes: 1.0% Mydriacyl, 2.5% Phenylephrine @ 3:39 PM           Slit Lamp and Fundus Exam     External Exam       Right Left   External Normal Normal         Slit Lamp Exam       Right Left   Lids/Lashes Normal Normal   Conjunctiva/Sclera White and quiet White and quiet   Cornea Clear Clear   Anterior Chamber Deep and quiet Deep and quiet   Iris Round and reactive Round and reactive   Lens Clear Clear   Anterior Vitreous Normal Normal         Fundus Exam       Right Left   Posterior Vitreous Normal Normal   Disc Normal Normal   C/D Ratio 0.05 0.05   Macula Intermediate age related macular degeneration, Soft drusen Intermediate age related macular degeneration, Soft drusen   Vessels Normal Normal   Periphery Normal Normal            IMAGING AND PROCEDURES  Imaging and Procedures for 01/03/21  OCT, Retina - OU - Both Eyes       Right Eye Quality was good. Scan locations included subfoveal. Central Foveal Thickness: 325. Progression has no prior data. Findings include no SRF, abnormal foveal contour, retinal drusen .   Left Eye Quality was good. Scan locations included subfoveal. Central Foveal Thickness: 320. Progression has no prior data. Findings include no SRF, retinal drusen , abnormal foveal contour.              ASSESSMENT/PLAN:  Snores I did provide the patient with a source for nighttime oximetry reading that can be worn for screening purposes, to help assist determine whether sleep apnea may be present in his case  Intermediate stage nonexudative age-related macular degeneration of both eyes The nature of age--related macular degeneration was discussed with the patient as well as the distinction between dry and wet types. Checking an Amsler Grid daily with advice to return immediately should a distortion develop, was given to the patient. The patient 's smoking status now and in the past was determined and advice based on  the AREDS study was provided regarding the consumption of antioxidant supplements. AREDS 2 vitamin formulation was recommended. Consumption of dark leafy vegetables and fresh fruits of various colors was recommended. Treatment modalities for wet macular degeneration particularly the use of intravitreal injections of anti-blood vessel growth factors was discussed with the patient. Avastin, Lucentis, and Eylea are the available options. On occasion, therapy includes the use of photodynamic therapy and thermal laser. Stressed to the patient do not rub eyes.  Patient was advised to check Amsler Grid daily and return immediately if changes are noted. Instructions on using the grid were given to the patient. All patient questions were answered.     ICD-10-CM   1. Intermediate stage nonexudative age-related macular degeneration of both eyes  H35.3132 OCT, Retina - OU - Both Eyes    2. Snores  R06.83       1.  2.  3.  Ophthalmic Meds Ordered this visit:  No orders of the defined types were placed in this encounter.      Return in about 1 year (around 01/03/2022) for DILATE OU, OCT.  Patient Instructions  The nature of age realated macular degeneration (ARMD)is explained as follows: The dry form refers to the progressive loss of normal blood  supply to the central vision as a result of a combination of factors which include aging blood supply (arteriosclerosis, hardening of the arteries), genetics, smoking habits, and history of hypertension. Currently, no eye medications or vitamins slow this type of aging effect upon vision, however cessation of smoking and controlling hypertension help slow the disorder. The following analogy helps explain this: I describe the dry form of ARMD like a house of the same age as your eyes, which shows typical wear and tear of age upon the house structure and appearance. Like the aging house which can fall down structurally, the dry form of ARMD can deteriorate the  structure of the macula (center) of the retina, most often gradually and affect central vision tasks such as reading and driving. The wet form of ARMD refers to the development of abnormally growing blood vessels usually near or under the central vision, with potential risk of permanent visual changes or vision losses. This complication of the Dry form of ARMD may be moderately reduced by use of AREDS 2 formula multivitamins daily. I describe the Wet form of ARMD as like the development of a fire in an aging house (DRY ARMD). It may develop suddenly, progress rapidly and be destructive based on where it starts and how big the fire is when found. In the eye, the house fire analogy refers to the abnormal blood vessels growing destructively near the central vison in the retina, or film of the eye. Halting the growth of blood vessels with laser (hot or cold) or injectable medications is best way "to put the fire out". Many patients will experience a stabilization or even improvement in vision with a treatment course, while others may still face a loss of vision. The use of injectable medications has revolutionized therapy and is currently the only proven therapy to provide the chance of stable or improved acuity from new and recent destructive wet ARMD. Age-Related Macular Degeneration  Age-related macular degeneration (AMD) is an eye disease related to aging. The disease causes a loss of central vision. Central vision allows a person to seeobjects clearly and do daily tasks like reading and driving. There are two main types of AMD: Dry AMD. People with this type generally lose their vision slowly. This is the most common type of AMD. Some people with dry AMD notice very little change in their vision as they age. Wet AMD. People with this type can lose their vision quickly. What are the causes? This condition is caused by damage to the part of the eye that provides you with central vision (macula). Dry AMD  happens when deposits in the macula cause light-sensitive cells to slowly break down. Wet AMD happens when abnormal blood vessels grow under the macula and leak blood and fluid. What increases the risk? You are more likely to develop this condition if you: Are 62 years old or older, and especially 22 years old or older. Smoke. Are obese. Have a family history of AMD. Have high cholesterol, high blood pressure, or heart disease. Have been exposed to high levels of ultraviolet (UV) light and blue light. Are white (Caucasian). Are male. What are the signs or symptoms? Common symptoms of this condition include: Blurred vision, especially when reading print material. The blurred vision often improves in brighter light. A blurred or blind spot in the center of your field of vision that is small but growing larger. Bright colors seeming less bright than they used to be. Decreased ability to recognize and see faces.  One eye seeing worse than the other. Decreased ability to adapt to dimly lit rooms. Straight lines appearing crooked or wavy. How is this diagnosed? This condition is diagnosed based on your symptoms and an eye exam. During the eye exam: Eye drops will be placed into your eyes to enlarge (dilate) your pupils. This will allow your health care provider to see the back of your eye. You may be asked to look at an image that looks like a checkerboard (Amsler grid). Early changes in your central vision may cause the grid to appear distorted. After the exam, you may be given one or both of these tests: Fluorescein angiogram. This test determines whether you have dry or wet AMD. Optical coherence tomography (OCT) test to evaluate deep layers of the retina. How is this treated? There is no cure for this condition, but treatment can help to slow down progression of the disease. This condition may be treated with: Supplements, including vitamin C, vitamin E, beta carotene, and zinc. Laser  surgery to destroy new blood vessels or leaking blood vessels in your eye. Injections of medicines into your eye to slow down the formation of abnormal blood vessels that may leak. These injections may need to be repeated on a routine basis. Follow these instructions at home: Take over-the-counter and prescription medicines only as told by your health care provider. Take vitamins and supplements as told by your health care provider. Ask your health care provider for an Amsler grid. Use it every day to check each eye for vision changes. Get an eye exam as often as told by your health care provider. Make sure to get an eye exam at least once every year. Keep all follow-up visits as told by your health care provider. This is important. Contact a health care provider if: You notice any new changes in your vision. Get help right away if: You suddenly lose vision or develop pain in the eye. Summary Age-related macular degeneration (AMD) is an eye disease related to aging. There are two types of this condition: dry AMD and wet AMD. This condition is caused by damage to the part of the eye that provides you with central vision (macula). Once diagnosed with AMD, make sure to get an eye exam every year, take supplements and vitamins as directed, use an Amsler grid at home, and follow up with your health care provider. This information is not intended to replace advice given to you by your health care provider. Make sure you discuss any questions you have with your healthcare provider. Document Revised: 12/12/2017 Document Reviewed: 12/12/2017 Elsevier Patient Education  2022 [place] the diagnoses, plan, and follow up with the patient and they expressed understanding.  Patient expressed understanding of the importance of proper follow up care.   Clent Demark Vonnie Ligman M.D. Diseases & Surgery of the Retina and Vitreous [facility] [place] 01/03/21     Abbreviations: M myopia  (nearsighted); A astigmatism; H hyperopia (farsighted); P presbyopia; Mrx spectacle prescription;  CTL contact lenses; OD right eye; OS left eye; OU both eyes  XT exotropia; ET esotropia; PEK punctate epithelial keratitis; PEE punctate epithelial erosions; DES dry eye syndrome; MGD meibomian gland dysfunction; ATs artificial tears; PFAT's preservative free artificial tears; [place] nuclear sclerotic cataract; PSC posterior subcapsular cataract; ERM epi-retinal membrane; PVD posterior vitreous detachment; RD retinal detachment; DM diabetes mellitus; DR diabetic retinopathy; NPDR non-proliferative diabetic retinopathy; PDR proliferative diabetic retinopathy; CSME clinically significant macular edema; DME diabetic macular edema;  dbh dot blot hemorrhages; CWS cotton wool spot; POAG primary open angle glaucoma; C/D cup-to-disc ratio; HVF humphrey visual field; GVF goldmann visual field; OCT optical coherence tomography; IOP intraocular pressure; BRVO Branch retinal vein occlusion; CRVO central retinal vein occlusion; CRAO central retinal artery occlusion; BRAO branch retinal artery occlusion; RT retinal tear; SB scleral buckle; PPV pars plana vitrectomy; VH Vitreous hemorrhage; PRP panretinal laser photocoagulation; IVK intravitreal kenalog; VMT vitreomacular traction; MH Macular hole;  NVD neovascularization of the disc; NVE neovascularization elsewhere; AREDS age related eye disease study; ARMD age related macular degeneration; POAG primary open angle glaucoma; EBMD epithelial/anterior basement membrane dystrophy; ACIOL anterior chamber intraocular lens; IOL intraocular lens; PCIOL posterior chamber intraocular lens; Phaco/IOL phacoemulsification with intraocular lens placement; [place] photorefractive keratectomy; LASIK laser assisted in situ keratomileusis; HTN hypertension; DM diabetes mellitus; COPD chronic obstructive pulmonary disease

## 2021-01-03 NOTE — Assessment & Plan Note (Signed)
I did provide the patient with a source for nighttime oximetry reading that can be worn for screening purposes, to help assist determine whether sleep apnea may be present in his case

## 2021-01-05 DIAGNOSIS — L814 Other melanin hyperpigmentation: Secondary | ICD-10-CM | POA: Diagnosis not present

## 2021-01-05 DIAGNOSIS — L905 Scar conditions and fibrosis of skin: Secondary | ICD-10-CM | POA: Diagnosis not present

## 2021-01-05 DIAGNOSIS — L570 Actinic keratosis: Secondary | ICD-10-CM | POA: Diagnosis not present

## 2021-01-05 DIAGNOSIS — D485 Neoplasm of uncertain behavior of skin: Secondary | ICD-10-CM | POA: Diagnosis not present

## 2021-01-05 DIAGNOSIS — Z85828 Personal history of other malignant neoplasm of skin: Secondary | ICD-10-CM | POA: Diagnosis not present

## 2021-01-13 DIAGNOSIS — H10413 Chronic giant papillary conjunctivitis, bilateral: Secondary | ICD-10-CM | POA: Diagnosis not present

## 2021-01-13 DIAGNOSIS — H0102B Squamous blepharitis left eye, upper and lower eyelids: Secondary | ICD-10-CM | POA: Diagnosis not present

## 2021-01-13 DIAGNOSIS — H353131 Nonexudative age-related macular degeneration, bilateral, early dry stage: Secondary | ICD-10-CM | POA: Diagnosis not present

## 2021-01-13 DIAGNOSIS — L570 Actinic keratosis: Secondary | ICD-10-CM | POA: Diagnosis not present

## 2021-01-13 DIAGNOSIS — H0102A Squamous blepharitis right eye, upper and lower eyelids: Secondary | ICD-10-CM | POA: Diagnosis not present

## 2021-01-13 DIAGNOSIS — Z961 Presence of intraocular lens: Secondary | ICD-10-CM | POA: Diagnosis not present

## 2021-01-13 DIAGNOSIS — H40013 Open angle with borderline findings, low risk, bilateral: Secondary | ICD-10-CM | POA: Diagnosis not present

## 2021-02-07 DIAGNOSIS — N400 Enlarged prostate without lower urinary tract symptoms: Secondary | ICD-10-CM | POA: Diagnosis not present

## 2021-02-24 DIAGNOSIS — E039 Hypothyroidism, unspecified: Secondary | ICD-10-CM | POA: Diagnosis not present

## 2021-02-24 DIAGNOSIS — R002 Palpitations: Secondary | ICD-10-CM | POA: Diagnosis not present

## 2021-02-24 DIAGNOSIS — E7800 Pure hypercholesterolemia, unspecified: Secondary | ICD-10-CM | POA: Diagnosis not present

## 2021-02-24 DIAGNOSIS — I471 Supraventricular tachycardia: Secondary | ICD-10-CM | POA: Diagnosis not present

## 2021-02-24 DIAGNOSIS — R0789 Other chest pain: Secondary | ICD-10-CM | POA: Diagnosis not present

## 2021-03-10 DIAGNOSIS — H40013 Open angle with borderline findings, low risk, bilateral: Secondary | ICD-10-CM | POA: Diagnosis not present

## 2021-03-10 DIAGNOSIS — H02055 Trichiasis without entropian left lower eyelid: Secondary | ICD-10-CM | POA: Diagnosis not present

## 2021-03-15 DIAGNOSIS — R002 Palpitations: Secondary | ICD-10-CM | POA: Diagnosis not present

## 2021-03-22 DIAGNOSIS — R002 Palpitations: Secondary | ICD-10-CM | POA: Diagnosis not present

## 2021-03-22 DIAGNOSIS — I471 Supraventricular tachycardia: Secondary | ICD-10-CM | POA: Diagnosis not present

## 2021-03-22 DIAGNOSIS — I10 Essential (primary) hypertension: Secondary | ICD-10-CM | POA: Diagnosis not present

## 2021-03-30 DIAGNOSIS — I10 Essential (primary) hypertension: Secondary | ICD-10-CM | POA: Diagnosis not present

## 2021-03-30 DIAGNOSIS — R002 Palpitations: Secondary | ICD-10-CM | POA: Diagnosis not present

## 2021-04-07 DIAGNOSIS — C44629 Squamous cell carcinoma of skin of left upper limb, including shoulder: Secondary | ICD-10-CM | POA: Diagnosis not present

## 2021-04-07 DIAGNOSIS — Z08 Encounter for follow-up examination after completed treatment for malignant neoplasm: Secondary | ICD-10-CM | POA: Diagnosis not present

## 2021-04-07 DIAGNOSIS — Z789 Other specified health status: Secondary | ICD-10-CM | POA: Diagnosis not present

## 2021-04-07 DIAGNOSIS — Z85828 Personal history of other malignant neoplasm of skin: Secondary | ICD-10-CM | POA: Diagnosis not present

## 2021-04-07 DIAGNOSIS — D225 Melanocytic nevi of trunk: Secondary | ICD-10-CM | POA: Diagnosis not present

## 2021-04-07 DIAGNOSIS — R229 Localized swelling, mass and lump, unspecified: Secondary | ICD-10-CM | POA: Diagnosis not present

## 2021-04-07 DIAGNOSIS — L820 Inflamed seborrheic keratosis: Secondary | ICD-10-CM | POA: Diagnosis not present

## 2021-04-07 DIAGNOSIS — L821 Other seborrheic keratosis: Secondary | ICD-10-CM | POA: Diagnosis not present

## 2021-04-07 DIAGNOSIS — C44622 Squamous cell carcinoma of skin of right upper limb, including shoulder: Secondary | ICD-10-CM | POA: Diagnosis not present

## 2021-04-07 DIAGNOSIS — L298 Other pruritus: Secondary | ICD-10-CM | POA: Diagnosis not present

## 2021-04-07 DIAGNOSIS — R208 Other disturbances of skin sensation: Secondary | ICD-10-CM | POA: Diagnosis not present

## 2021-04-19 DIAGNOSIS — D0461 Carcinoma in situ of skin of right upper limb, including shoulder: Secondary | ICD-10-CM | POA: Diagnosis not present

## 2021-04-19 DIAGNOSIS — D485 Neoplasm of uncertain behavior of skin: Secondary | ICD-10-CM | POA: Diagnosis not present

## 2021-04-19 DIAGNOSIS — L905 Scar conditions and fibrosis of skin: Secondary | ICD-10-CM | POA: Diagnosis not present

## 2021-04-19 DIAGNOSIS — C44722 Squamous cell carcinoma of skin of right lower limb, including hip: Secondary | ICD-10-CM | POA: Diagnosis not present

## 2021-05-03 DIAGNOSIS — I872 Venous insufficiency (chronic) (peripheral): Secondary | ICD-10-CM | POA: Diagnosis not present

## 2021-05-03 DIAGNOSIS — C44722 Squamous cell carcinoma of skin of right lower limb, including hip: Secondary | ICD-10-CM | POA: Diagnosis not present

## 2021-05-23 DIAGNOSIS — N401 Enlarged prostate with lower urinary tract symptoms: Secondary | ICD-10-CM | POA: Diagnosis not present

## 2021-05-23 DIAGNOSIS — R3915 Urgency of urination: Secondary | ICD-10-CM | POA: Diagnosis not present

## 2021-05-23 DIAGNOSIS — N398 Other specified disorders of urinary system: Secondary | ICD-10-CM | POA: Diagnosis not present

## 2021-05-26 DIAGNOSIS — H3530 Unspecified macular degeneration: Secondary | ICD-10-CM | POA: Diagnosis not present

## 2021-05-26 DIAGNOSIS — I10 Essential (primary) hypertension: Secondary | ICD-10-CM | POA: Diagnosis not present

## 2021-05-26 DIAGNOSIS — R0683 Snoring: Secondary | ICD-10-CM | POA: Diagnosis not present

## 2021-06-02 DIAGNOSIS — G4734 Idiopathic sleep related nonobstructive alveolar hypoventilation: Secondary | ICD-10-CM | POA: Diagnosis not present

## 2021-06-02 DIAGNOSIS — G4710 Hypersomnia, unspecified: Secondary | ICD-10-CM | POA: Diagnosis not present

## 2021-07-14 DIAGNOSIS — L565 Disseminated superficial actinic porokeratosis (DSAP): Secondary | ICD-10-CM | POA: Diagnosis not present

## 2021-07-14 DIAGNOSIS — Z08 Encounter for follow-up examination after completed treatment for malignant neoplasm: Secondary | ICD-10-CM | POA: Diagnosis not present

## 2021-07-14 DIAGNOSIS — C44729 Squamous cell carcinoma of skin of left lower limb, including hip: Secondary | ICD-10-CM | POA: Diagnosis not present

## 2021-07-14 DIAGNOSIS — D485 Neoplasm of uncertain behavior of skin: Secondary | ICD-10-CM | POA: Diagnosis not present

## 2021-07-14 DIAGNOSIS — L570 Actinic keratosis: Secondary | ICD-10-CM | POA: Diagnosis not present

## 2021-07-14 DIAGNOSIS — R229 Localized swelling, mass and lump, unspecified: Secondary | ICD-10-CM | POA: Diagnosis not present

## 2021-07-14 DIAGNOSIS — L821 Other seborrheic keratosis: Secondary | ICD-10-CM | POA: Diagnosis not present

## 2021-07-14 DIAGNOSIS — Z85828 Personal history of other malignant neoplasm of skin: Secondary | ICD-10-CM | POA: Diagnosis not present

## 2021-07-14 DIAGNOSIS — C44722 Squamous cell carcinoma of skin of right lower limb, including hip: Secondary | ICD-10-CM | POA: Diagnosis not present

## 2021-07-14 DIAGNOSIS — C44329 Squamous cell carcinoma of skin of other parts of face: Secondary | ICD-10-CM | POA: Diagnosis not present

## 2021-07-14 DIAGNOSIS — D2371 Other benign neoplasm of skin of right lower limb, including hip: Secondary | ICD-10-CM | POA: Diagnosis not present

## 2021-08-03 DIAGNOSIS — D0439 Carcinoma in situ of skin of other parts of face: Secondary | ICD-10-CM | POA: Diagnosis not present

## 2021-08-17 DIAGNOSIS — L989 Disorder of the skin and subcutaneous tissue, unspecified: Secondary | ICD-10-CM | POA: Diagnosis not present

## 2021-08-17 DIAGNOSIS — L578 Other skin changes due to chronic exposure to nonionizing radiation: Secondary | ICD-10-CM | POA: Diagnosis not present

## 2021-08-17 DIAGNOSIS — I872 Venous insufficiency (chronic) (peripheral): Secondary | ICD-10-CM | POA: Diagnosis not present

## 2021-08-17 DIAGNOSIS — D485 Neoplasm of uncertain behavior of skin: Secondary | ICD-10-CM | POA: Diagnosis not present

## 2021-08-17 DIAGNOSIS — L570 Actinic keratosis: Secondary | ICD-10-CM | POA: Diagnosis not present

## 2021-08-17 DIAGNOSIS — D0472 Carcinoma in situ of skin of left lower limb, including hip: Secondary | ICD-10-CM | POA: Diagnosis not present

## 2021-10-19 DIAGNOSIS — L570 Actinic keratosis: Secondary | ICD-10-CM | POA: Diagnosis not present

## 2021-10-19 DIAGNOSIS — C44729 Squamous cell carcinoma of skin of left lower limb, including hip: Secondary | ICD-10-CM | POA: Diagnosis not present

## 2021-10-19 DIAGNOSIS — Z85828 Personal history of other malignant neoplasm of skin: Secondary | ICD-10-CM | POA: Diagnosis not present

## 2021-10-19 DIAGNOSIS — D1801 Hemangioma of skin and subcutaneous tissue: Secondary | ICD-10-CM | POA: Diagnosis not present

## 2021-10-19 DIAGNOSIS — L821 Other seborrheic keratosis: Secondary | ICD-10-CM | POA: Diagnosis not present

## 2021-10-19 DIAGNOSIS — C000 Malignant neoplasm of external upper lip: Secondary | ICD-10-CM | POA: Diagnosis not present

## 2021-10-19 DIAGNOSIS — Z08 Encounter for follow-up examination after completed treatment for malignant neoplasm: Secondary | ICD-10-CM | POA: Diagnosis not present

## 2021-10-19 DIAGNOSIS — R229 Localized swelling, mass and lump, unspecified: Secondary | ICD-10-CM | POA: Diagnosis not present

## 2021-11-18 DIAGNOSIS — H6122 Impacted cerumen, left ear: Secondary | ICD-10-CM | POA: Diagnosis not present

## 2021-11-18 DIAGNOSIS — E039 Hypothyroidism, unspecified: Secondary | ICD-10-CM | POA: Diagnosis not present

## 2021-11-18 DIAGNOSIS — H353131 Nonexudative age-related macular degeneration, bilateral, early dry stage: Secondary | ICD-10-CM | POA: Diagnosis not present

## 2021-11-18 DIAGNOSIS — Z79899 Other long term (current) drug therapy: Secondary | ICD-10-CM | POA: Diagnosis not present

## 2021-11-18 DIAGNOSIS — E7800 Pure hypercholesterolemia, unspecified: Secondary | ICD-10-CM | POA: Diagnosis not present

## 2021-11-18 DIAGNOSIS — M8580 Other specified disorders of bone density and structure, unspecified site: Secondary | ICD-10-CM | POA: Diagnosis not present

## 2021-11-18 DIAGNOSIS — F900 Attention-deficit hyperactivity disorder, predominantly inattentive type: Secondary | ICD-10-CM | POA: Diagnosis not present

## 2021-11-18 DIAGNOSIS — Z1331 Encounter for screening for depression: Secondary | ICD-10-CM | POA: Diagnosis not present

## 2021-11-18 DIAGNOSIS — E222 Syndrome of inappropriate secretion of antidiuretic hormone: Secondary | ICD-10-CM | POA: Diagnosis not present

## 2021-11-18 DIAGNOSIS — Z0000 Encounter for general adult medical examination without abnormal findings: Secondary | ICD-10-CM | POA: Diagnosis not present

## 2021-11-21 DIAGNOSIS — H6122 Impacted cerumen, left ear: Secondary | ICD-10-CM | POA: Diagnosis not present

## 2021-11-27 ENCOUNTER — Other Ambulatory Visit: Payer: Self-pay

## 2021-11-27 ENCOUNTER — Encounter (HOSPITAL_COMMUNITY): Payer: Self-pay

## 2021-11-27 ENCOUNTER — Observation Stay (HOSPITAL_COMMUNITY)
Admission: EM | Admit: 2021-11-27 | Discharge: 2021-11-28 | Disposition: A | Discharge status: Home / Self Care | Payer: Medicare PPO | Attending: Internal Medicine | Admitting: Internal Medicine

## 2021-11-27 ENCOUNTER — Emergency Department (HOSPITAL_COMMUNITY): Payer: Medicare PPO

## 2021-11-27 DIAGNOSIS — E039 Hypothyroidism, unspecified: Secondary | ICD-10-CM

## 2021-11-27 DIAGNOSIS — R000 Tachycardia, unspecified: Secondary | ICD-10-CM | POA: Diagnosis not present

## 2021-11-27 DIAGNOSIS — Z87891 Personal history of nicotine dependence: Secondary | ICD-10-CM | POA: Insufficient documentation

## 2021-11-27 DIAGNOSIS — E222 Syndrome of inappropriate secretion of antidiuretic hormone: Secondary | ICD-10-CM | POA: Insufficient documentation

## 2021-11-27 DIAGNOSIS — Z85828 Personal history of other malignant neoplasm of skin: Secondary | ICD-10-CM | POA: Insufficient documentation

## 2021-11-27 DIAGNOSIS — I10 Essential (primary) hypertension: Secondary | ICD-10-CM | POA: Diagnosis present

## 2021-11-27 DIAGNOSIS — J45909 Unspecified asthma, uncomplicated: Secondary | ICD-10-CM | POA: Diagnosis not present

## 2021-11-27 DIAGNOSIS — E7800 Pure hypercholesterolemia, unspecified: Secondary | ICD-10-CM

## 2021-11-27 DIAGNOSIS — G4710 Hypersomnia, unspecified: Secondary | ICD-10-CM

## 2021-11-27 DIAGNOSIS — R002 Palpitations: Secondary | ICD-10-CM | POA: Diagnosis not present

## 2021-11-27 DIAGNOSIS — I4819 Other persistent atrial fibrillation: Secondary | ICD-10-CM | POA: Diagnosis not present

## 2021-11-27 DIAGNOSIS — R001 Bradycardia, unspecified: Secondary | ICD-10-CM | POA: Diagnosis not present

## 2021-11-27 DIAGNOSIS — R531 Weakness: Secondary | ICD-10-CM | POA: Diagnosis not present

## 2021-11-27 DIAGNOSIS — Z7982 Long term (current) use of aspirin: Secondary | ICD-10-CM | POA: Diagnosis not present

## 2021-11-27 DIAGNOSIS — I4891 Unspecified atrial fibrillation: Secondary | ICD-10-CM | POA: Diagnosis not present

## 2021-11-27 DIAGNOSIS — I4892 Unspecified atrial flutter: Secondary | ICD-10-CM | POA: Diagnosis not present

## 2021-11-27 DIAGNOSIS — F988 Other specified behavioral and emotional disorders with onset usually occurring in childhood and adolescence: Secondary | ICD-10-CM | POA: Diagnosis present

## 2021-11-27 DIAGNOSIS — N400 Enlarged prostate without lower urinary tract symptoms: Secondary | ICD-10-CM | POA: Diagnosis present

## 2021-11-27 DIAGNOSIS — Z79899 Other long term (current) drug therapy: Secondary | ICD-10-CM | POA: Insufficient documentation

## 2021-11-27 DIAGNOSIS — I484 Atypical atrial flutter: Secondary | ICD-10-CM | POA: Diagnosis present

## 2021-11-27 LAB — TROPONIN I (HIGH SENSITIVITY)
Troponin I (High Sensitivity): 9 ng/L (ref <18)
Troponin I (High Sensitivity): 9 ng/L (ref <18)

## 2021-11-27 LAB — BASIC METABOLIC PANEL
Anion gap: 10 (ref 5–15)
BUN: 19 mg/dL (ref 8–23)
CO2: 28 mmol/L (ref 22–32)
Calcium: 9.4 mg/dL (ref 8.9–10.3)
Chloride: 97 mmol/L — ABNORMAL LOW (ref 98–111)
Creatinine, Ser: 0.95 mg/dL (ref 0.61–1.24)
GFR, Estimated: >60 mL/min (ref >60)
Glucose, Bld: 104 mg/dL — ABNORMAL HIGH (ref 70–99)
Potassium: 4.7 mmol/L (ref 3.5–5.1)
Sodium: 135 mmol/L (ref 135–145)

## 2021-11-27 LAB — CBC
HCT: 45.3 % (ref 39.0–52.0)
Hemoglobin: 15.6 g/dL (ref 13.0–17.0)
MCH: 33.1 pg (ref 26.0–34.0)
MCHC: 34.4 g/dL (ref 30.0–36.0)
MCV: 96 fL (ref 80.0–100.0)
Platelets: 256 10*3/uL (ref 150–400)
RBC: 4.72 MIL/uL (ref 4.22–5.81)
RDW: 12.3 % (ref 11.5–15.5)
WBC: 5.8 10*3/uL (ref 4.0–10.5)
nRBC: 0 % (ref 0.0–0.2)

## 2021-11-27 LAB — MAGNESIUM: Magnesium: 2.2 mg/dL (ref 1.7–2.4)

## 2021-11-27 LAB — TSH: TSH: 2.962 u[IU]/mL (ref 0.350–4.500)

## 2021-11-27 MED ORDER — ACETAMINOPHEN 325 MG PO TABS: 650.0000 mg | ORAL_TABLET | ORAL | Status: DC | PRN

## 2021-11-27 MED ORDER — ONDANSETRON HCL 4 MG/2ML IJ SOLN: 4.0000 mg | Freq: Four times a day (QID) | INTRAMUSCULAR | Status: DC | PRN

## 2021-11-27 MED ORDER — APIXABAN 5 MG PO TABS
5.0000 mg | ORAL_TABLET | Freq: Two times a day (BID) | ORAL | Status: DC
  Administered 2021-11-27 – 2021-11-28 (×2): 5 mg via ORAL
  Filled 2021-11-27 (×2): qty 1

## 2021-11-27 MED ORDER — LEVOTHYROXINE SODIUM 100 MCG PO TABS
200.0000 ug | ORAL_TABLET | Freq: Every morning | ORAL | Status: DC
  Administered 2021-11-28: 200 ug via ORAL
  Filled 2021-11-27: qty 2

## 2021-11-27 MED ORDER — TADALAFIL 5 MG PO TABS
5.0000 mg | ORAL_TABLET | Freq: Every day | ORAL | Status: DC
  Administered 2021-11-28: 5 mg via ORAL
  Filled 2021-11-27: qty 1

## 2021-11-27 MED ORDER — BUPROPION HCL ER (XL) 300 MG PO TB24
300.0000 mg | ORAL_TABLET | Freq: Every day | ORAL | Status: DC
  Administered 2021-11-28: 300 mg via ORAL
  Filled 2021-11-27 (×2): qty 1

## 2021-11-27 MED ORDER — DILTIAZEM HCL 25 MG/5ML IV SOLN
10.0000 mg | Freq: Once | INTRAVENOUS | Status: AC
  Administered 2021-11-27: 10 mg via INTRAVENOUS
  Filled 2021-11-27: qty 5

## 2021-11-27 MED ORDER — BECLOMETHASONE DIPROP HFA 80 MCG/ACT IN AERB: 1.0000 | INHALATION_SPRAY | Freq: Two times a day (BID) | RESPIRATORY_TRACT | Status: DC

## 2021-11-27 MED ORDER — METOPROLOL TARTRATE 25 MG PO TABS
12.5000 mg | ORAL_TABLET | Freq: Two times a day (BID) | ORAL | Status: DC
  Administered 2021-11-27 – 2021-11-28 (×2): 12.5 mg via ORAL
  Filled 2021-11-27 (×2): qty 1

## 2021-11-27 MED ORDER — MIRABEGRON ER 50 MG PO TB24
50.0000 mg | ORAL_TABLET | Freq: Every day | ORAL | Status: DC
  Administered 2021-11-28: 50 mg via ORAL
  Filled 2021-11-27 (×2): qty 1

## 2021-11-27 MED ORDER — LEVALBUTEROL HCL 0.63 MG/3ML IN NEBU: 0.6300 mg | INHALATION_SOLUTION | Freq: Four times a day (QID) | RESPIRATORY_TRACT | Status: DC | PRN

## 2021-11-27 MED ORDER — ROSUVASTATIN CALCIUM 5 MG PO TABS
5.0000 mg | ORAL_TABLET | Freq: Every day | ORAL | Status: DC
  Administered 2021-11-28: 5 mg via ORAL
  Filled 2021-11-27: qty 1

## 2021-11-27 NOTE — Assessment & Plan Note (Addendum)
81 year old male with history of sudden onset of palpitations found to be in atrial flutter/fib with rates to 140.  -observation to telemetry -echo -rate controlled with one dose of cardizem, but remains in atrial fibrillation -start beta blocker, low dose  -on ASA daily, CHA2DS2-VASC score of 3 discussed anticoagulation and will start eliquis. He does have history of nose bleeds so did discuss we will have to watch this carefully -holding home adderall.  -repeat TSH pending

## 2021-11-27 NOTE — Assessment & Plan Note (Signed)
Continue crestor 

## 2021-11-27 NOTE — ED Provider Triage Note (Signed)
Emergency Medicine Provider Triage Evaluation Note  Michael Bates , a 81 y.o. male  was evaluated in triage.  Pt complains of palpitations along with weakness x this morning.  Patient reports getting woken up this morning, began to have some palpitations, felt overall weak.  Checked his watch which showed that he had a rapid heart rate.  He was evaluated at an urgent care?  And states they did an EKG transported via EMS.  Continues to feel overall weak.  Similar episode last December 2022 where he had a baby aspirin and a regular echo according to patient.  He does not have any established cardiologist.  Does have a history of asthma but reports symptoms before.  Review of Systems  Positive: Palpitations, weakness  Negative: Fever, cough, sob  Physical Exam  There were no vitals taken for this visit. Gen:   Awake, no distress   Resp:  Normal effort  MSK:   Moves extremities without difficulty  Other:    Medical Decision Making  Medically screening exam initiated at 1:43 PM.  Appropriate orders placed.  Michael Bates was informed that the remainder of the evaluation will be completed by another provider, this initial triage assessment does not replace that evaluation, and the importance of remaining in the ED until their evaluation is complete.  EKG performed in triage, patient in A-fib heart rate in the 137's.  Will need room ASAP.   Michael Fitting, PA-C 11/27/21 1349

## 2021-11-27 NOTE — Assessment & Plan Note (Signed)
TSH wnl in 07/2021 Repeat pending with new atrial fib/flutter Continue home synthroid at 261mg daily

## 2021-11-27 NOTE — Assessment & Plan Note (Signed)
Continue home qvar Will change to xoponex prn instead of albuterol with new atrial fib/flutter with RVR

## 2021-11-27 NOTE — ED Triage Notes (Signed)
Patient arrived by [facility] for palpitations that awoke him this am 0500. Patient denie pain, reports had similar in past and wore monitor for same. Patient denies SOB, more fluttering and weakness. Denies pain Normally walks 4 miles per day.

## 2021-11-27 NOTE — ED Provider Notes (Signed)
[facility] EMERGENCY DEPARTMENT Provider Note   CSN: [id number] Arrival date & time: 11/27/21  1339     History  Chief Complaint  Patient presents with   Irregular Heart Beat    Michael Bates is a 81 y.o. male.  81 year old male with prior medical history as detailed below presents for evaluation.  Patient reports that around 5 AM he developed palpitations.  He reports mild lightheadedness associated with this.  He denies chest pain or shortness of breath.  Patient reports history of SVT most recently experienced in September 2022.  His primary care provider Dr. Felipa Eth worked him up in the outpatient setting.  Patient is without reported history of atrial fibrillation.  Patient is not currently on anticoagulation.  Patient without established cardiology care in the outpatient setting.  Patient denies personal history of significant cardiac disease.  The history is provided by the patient and medical records.  Illness Location:  Palpitations, history of SVT, concern for possible new onset atrial fibrillation Severity:  Moderate Onset quality:  Sudden Duration: Rapid heartbeat and palpitations at 5 AM. Timing:  Rare Progression:  Unchanged Chronicity:  New      Home Medications Prior to Admission medications   Medication Sig Start Date End Date Taking? Authorizing Provider  acetaminophen (TYLENOL) 500 MG tablet Take 500 mg by mouth every 6 (six) hours as needed for moderate pain or headache.    [provider]  albuterol (PROVENTIL HFA;VENTOLIN HFA) 108 (90 BASE) MCG/ACT inhaler Inhale 2 puffs into the lungs See admin instructions. Inhale 2 puffs every morning, may inhale 2 puffs every 6 hours as needed for shortness of breath    [provider]  beclomethasone (QVAR) 80 MCG/ACT inhaler Inhale 1 puff into the lungs 2 (two) times daily.    [provider]  Bioflavonoid Products (ESTER C PO) Take 500 mg by mouth daily.     [provider]  buPROPion (WELLBUTRIN XL) 300 MG 24 hr tablet Take 300 mg by mouth daily. 11/30/15   [provider]  Cholecalciferol (VITAMIN D3) 5000 units CAPS Take 5,000 Units by mouth daily.    [provider]  CINNAMON PO Take 2 tablets by mouth every evening.    [provider]  Coenzyme Q10 300 MG CAPS Take 300 mg by mouth daily.    [provider]  Collagen-Boron-Hyaluronic Acid ([employer] JOINT HEALTH TRIPLE ACTION PO) Take 1 tablet by mouth daily.    [provider]  ibuprofen (ADVIL) 800 MG tablet Take 1 tablet (800 mg total) by mouth every 8 (eight) hours as needed. 12/01/19   Kinsinger, Arta Bruce, MD  irbesartan (AVAPRO) 75 MG tablet Take 75 mg by mouth at bedtime.    [provider]  Ketotifen Fumarate (ALAWAY OP) Place 1 drop into both eyes daily as needed (allergies).    [provider]  levothyroxine (SYNTHROID, LEVOTHROID) 200 MCG tablet Take 200 mcg by mouth every morning. 12/30/15   [provider]  methylphenidate 18 MG PO CR tablet Take 18 mg by mouth daily. 12/29/15   [provider]  mirabegron ER (MYRBETRIQ) 50 MG TB24 tablet Take 50 mg by mouth daily.    [provider]  Multiple Vitamins-Minerals (CENTRUM ULTRA MENS PO) Take 1 tablet by mouth daily.     [provider]  Multiple Vitamins-Minerals (LUTEIN-ZEAXANTHIN PO) Take 1 tablet by mouth every evening.    [provider]  Omega-3 Fatty Acids (OMEGA 3 500 PO) Take  500 mg by mouth daily.    [provider]  oxyCODONE (OXY IR/ROXICODONE) 5 MG immediate release tablet Take 1 tablet (5 mg total) by mouth every 6 (six) hours as needed for severe pain. 12/01/19   Kinsinger, Arta Bruce, MD  rosuvastatin (CRESTOR) 5 MG tablet Take 5 mg by mouth daily.    [provider]  tadalafil (CIALIS) 5 MG tablet Take 5 mg by mouth daily.     [provider]  TURMERIC CURCUMIN PO Take 1,000 mg by mouth 2  (two) times daily.     [provider]      Allergies    Tape    Review of Systems   Review of Systems  All other systems reviewed and are negative.   Physical Exam Updated Vital Signs BP 122/82   Pulse 70   Temp 98.7 F (37.1 C) (Oral)   Resp 19   SpO2 99%  Physical Exam Vitals and nursing note reviewed.  Constitutional:      General: He is not in acute distress.    Appearance: Normal appearance. He is well-developed.  HENT:     Head: Normocephalic and atraumatic.  Eyes:     Conjunctiva/sclera: Conjunctivae normal.     Pupils: Pupils are equal, round, and reactive to light.  Cardiovascular:     Rate and Rhythm: Tachycardia present. Rhythm irregular.     Heart sounds: Normal heart sounds.  Pulmonary:     Effort: Pulmonary effort is normal. No respiratory distress.     Breath sounds: Normal breath sounds.  Abdominal:     General: There is no distension.     Palpations: Abdomen is soft.     Tenderness: There is no abdominal tenderness.  Musculoskeletal:        General: No deformity. Normal range of motion.     Cervical back: Normal range of motion and neck supple.  Skin:    General: Skin is warm and dry.  Neurological:     General: No focal deficit present.     Mental Status: He is alert and oriented to person, place, and time.     ED Results / Procedures / Treatments   Labs (all labs ordered are listed, but only abnormal results are displayed) Labs Reviewed  BASIC METABOLIC PANEL - Abnormal; Notable for the following components:      Result Value   Chloride 97 (*)    Glucose, Bld 104 (*)    All other components within normal limits  MAGNESIUM  CBC  TROPONIN I (HIGH SENSITIVITY)  TROPONIN I (HIGH SENSITIVITY)    EKG EKG Interpretation  Date/Time:  Sunday November 27 2021 16:15:47 EDT Ventricular Rate:  84 PR Interval:    QRS Duration: 82 QT Interval:  355 QTC Calculation: 420 R Axis:   -35 Text Interpretation: Atrial flutter Left axis  deviation Confirmed by Dene Gentry [phone]) on 11/27/2021 4:18:31 PM  Radiology DG Chest Port 1 View  Result Date: 11/27/2021 CLINICAL DATA:  Weakness EXAM: PORTABLE CHEST 1 VIEW COMPARISON:  03/02/2020 FINDINGS: The heart size and mediastinal contours are within normal limits. Both lungs are clear. The visualized skeletal structures are unremarkable. IMPRESSION: No acute abnormality of the lungs in AP portable projection. Electronically Signed   By: Delanna Ahmadi M.D.   On: 11/27/2021 15:23    Procedures Procedures    Medications Ordered in ED Medications  diltiazem (CARDIZEM) injection 10 mg (10 mg Intravenous Given 11/27/21 1554)    ED Course/ Medical Decision  Making/ A&P                           Medical Decision Making Amount and/or Complexity of Data Reviewed Radiology: ordered.  Risk Prescription drug management.    Medical Screen Complete  This patient presented to the ED with complaint of palpitations.  This complaint involves an extensive number of treatment options. The initial differential diagnosis includes, but is not limited to, arrhythmia, new onset atrial fibrillation, metabolic abnormality, etc.  This presentation is: Acute, Self-Limited, Previously Undiagnosed, Uncertain Prognosis, Complicated, Systemic Symptoms, and Threat to Life/Bodily Function  Patient is presenting for evaluation of reported palpitations.    Onset of symptoms this morning  Patient with out prior history of A-fib.  Patient with prior history of SVT.  Patient is not currently anticoagulated.  With administration of IV Cardizem the patient's heart rate improved from the 130s to the 70s and 80s.  Patient reports feeling significantly improved with lower heart rate.  Case discussed briefly with [name] who agrees with plan for medicine admission.  She recommends echocardiogram in the morning.  She recommends official cardiology consult in the a.m. by admitting team.  Hospitalist  service is aware of case and will evaluate for admission.    Co morbidities that complicated the patient's evaluation  Hypertension, hypercholesterolemia, hypothyroid   Additional history obtained:  External records from outside sources obtained and reviewed including prior ED visits and prior Inpatient records.    Lab Tests:  I ordered and personally interpreted labs.  The pertinent results include: CBC, BMP, troponin, magnesium   Imaging Studies ordered:  I ordered imaging studies including CXR  I independently visualized and interpreted obtained imaging which showed NAD I agree with the radiologist interpretation.   Cardiac Monitoring:  The patient was maintained on a cardiac monitor.  I personally viewed and interpreted the cardiac monitor which showed an underlying rhythm of: Afib with RVR   Medicines ordered:  I ordered medication including cardizem   for rate control  Reevaluation of the patient after these medicines showed that the patient: improved  Consultations Obtained:  I consulted [name],  and discussed lab and imaging findings as well as pertinent plan of care.  She agrees with plan for medicine admission, echocardiogram in the a.m., and official cardiology consultation.   Problem List / ED Course:  New onset atrial fibrillation   Reevaluation:  After the interventions noted above, I reevaluated the patient and found that they have: improved   Disposition:  After consideration of the diagnostic results and the patients response to treatment, I feel that the patent would benefit from admission.          Final Clinical Impression(s) / ED Diagnoses Final diagnoses:  Atrial fibrillation, unspecified type [facility])    Rx / DC Orders ED Discharge Orders     None         Valarie Merino, MD 11/27/21 1725

## 2021-11-27 NOTE — ED Notes (Signed)
Dinner tray delivered.

## 2021-11-27 NOTE — ED Notes (Addendum)
Informed Gardner, DO about pt's heart rate and rhythm. Pt alternating between afib and sinus tachycardia at a rate of 135-140. Pt is alert, asymptomatic, denies any chest pain or discomfort.

## 2021-11-27 NOTE — Assessment & Plan Note (Signed)
Hold his adderral in setting of new atrial flutter

## 2021-11-27 NOTE — ED Notes (Signed)
Educated pt on eliquis medication, pt appeared to understand education. No needs expressed at this time, no acute distress noted. Lights set to pt comfort, bed locked, call light within reach.

## 2021-11-27 NOTE — Assessment & Plan Note (Addendum)
Well controlled. Holding home norvasc/avapro as bp to goal and getting metoprolol tonight  Monitor pressures with addition of beta blocker and adjust home medication as needed

## 2021-11-27 NOTE — H&P (Addendum)
History and Physical    Patient: Michael Bates RCV:[id number] DOB: 1940-10-26 DOA: 11/27/2021 DOS: the patient was seen and examined on 11/27/2021 PCP: Lajean Manes, MD  Patient coming from: Home - lives with his wife. Ambulates independently    Chief Complaint: palpitations  HPI: [name] is a 81 y.o. male with medical history significant of asthma, PVCs, GERD, HTN, HLD, hypothyroidism, adult ADD, SIADH who presented to ED with complaints of palpitations. This morning around 5AM he woke up with palpitations and his apple watch said atrial fibrillation. Rate was up to 140s. He felt weak, dizzy and nauseated. Denied any chest pain or shortness of breath. He went to [facility] urgent care where they sent him to the ED.   History of palpitations in the fall and appears it was SVT. He has holter recorder which showed 3.4% SVT burden with 1st degree AV block. He does take an ASA daily.     He has been feeling good. Denies any fever/chills, vision changes/headaches, chest pain, shortness of breath or cough, abdominal pain, N/V/D, dysuria or leg swelling.    He does not smoke, drinks wine    ER Course:  vitals: afebrile, bp: 139/99, HR: 137, RR: 18, oxygen: 100% RA Pertinent labs: none  CXR: no acute finding Ekg: atrial flutter rate of 136 with 2:1 av conduction  In ED: given '10mg'$  cardizem. Cardiology consulted.     Review of Systems: As mentioned in the history of present illness. All other systems reviewed and are negative. Past Medical History:  Diagnosis Date   Asthma    BPH (benign prostatic hyperplasia)    Cancer (HCC)    hx of skin cancer    Degenerative arthritis of cervical spine    c5-c6   Dysrhythmia    hx of pvcs in the past    Family history of adverse reaction to anesthesia    Father had idosyncratic narcotic  reaction   GERD (gastroesophageal reflux disease)    occ    Hyperlipidemia    Hypertension    Hypothyroidism    Osteopenia    Raynaud disease     Renal disease    stage 3, patient not aware of this diagnosis   Thyroid disease    Past Surgical History:  Procedure Laterality Date   COLONOSCOPY WITH PROPOFOL N/A 01/10/2016   Procedure: COLONOSCOPY WITH PROPOFOL;  Surgeon: Garlan Fair, MD;  Location: [facility] ENDOSCOPY;  Service: Endoscopy;  Laterality: N/A;   HEMORROIDECTOMY     INGUINAL HERNIA REPAIR Bilateral 12/01/2019   Procedure: LAPAROSCOPIC BILATERAL INGUINAL HERNIA REPAIR WITH MESH,UMBILICAL HERNIA REPAIR;  Surgeon: Kinsinger, Arta Bruce, MD;  Location: [facility] ORS;  Service: General;  Laterality: Bilateral;   PAROTIDECTOMY  2017   Dr. Redmond Baseman   PROSTATECTOMY     Social History:  reports that he quit smoking about 44 years ago. His smoking use included cigarettes and pipe. He has a 20.00 pack-year smoking history. He has never used smokeless tobacco. He reports current alcohol use of about 7.0 standard drinks of alcohol per week. He reports that he does not use drugs.  Allergies  Allergen Reactions   Lisinopril Cough   Tape Other (See Comments)    RED IRRITATION IF LEFT ON LONG TIME    History reviewed. No pertinent family history.  Prior to Admission medications   Medication Sig Start Date End Date Taking? Authorizing Provider  acetaminophen (TYLENOL) 500 MG tablet Take 500 mg by mouth every 6 (six) hours as needed  for moderate pain or headache.    [provider]  albuterol (PROVENTIL HFA;VENTOLIN HFA) 108 (90 BASE) MCG/ACT inhaler Inhale 2 puffs into the lungs See admin instructions. Inhale 2 puffs every morning, may inhale 2 puffs every 6 hours as needed for shortness of breath    [provider]  beclomethasone (QVAR) 80 MCG/ACT inhaler Inhale 1 puff into the lungs 2 (two) times daily.    [provider]  Bioflavonoid Products (ESTER C PO) Take 500 mg by mouth daily.    [provider]  buPROPion (WELLBUTRIN XL) 300 MG 24 hr tablet Take 300 mg by mouth daily. 11/30/15   [provider]  Cholecalciferol (VITAMIN D3) 5000 units CAPS Take 5,000 Units by mouth daily.    [provider]  CINNAMON PO Take 2 tablets by mouth every evening.    [provider]  Coenzyme Q10 300 MG CAPS Take 300 mg by mouth daily.    [provider]  Collagen-Boron-Hyaluronic Acid ([employer] JOINT HEALTH TRIPLE ACTION PO) Take 1 tablet by mouth daily.    [provider]  ibuprofen (ADVIL) 800 MG tablet Take 1 tablet (800 mg total) by mouth every 8 (eight) hours as needed. 12/01/19   Kinsinger, Arta Bruce, MD  irbesartan (AVAPRO) 75 MG tablet Take 75 mg by mouth at bedtime.    [provider]  Ketotifen Fumarate (ALAWAY OP) Place 1 drop into both eyes daily as needed (allergies).    [provider]  levothyroxine (SYNTHROID, LEVOTHROID) 200 MCG tablet Take 200 mcg by mouth every morning. 12/30/15   [provider]  methylphenidate 18 MG PO CR tablet Take 18 mg by mouth daily. 12/29/15   [provider]  mirabegron ER (MYRBETRIQ) 50 MG TB24 tablet Take 50 mg by mouth daily.    [provider]  Multiple Vitamins-Minerals (CENTRUM ULTRA MENS PO) Take 1 tablet by mouth daily.     [provider]  Multiple Vitamins-Minerals (LUTEIN-ZEAXANTHIN PO) Take 1 tablet by mouth every evening.    [provider]  Omega-3 Fatty Acids (OMEGA 3 500 PO) Take 500 mg by mouth daily.    [provider]  oxyCODONE (OXY IR/ROXICODONE) 5 MG immediate release tablet Take 1 tablet (5 mg total) by mouth every 6 (six) hours as needed for severe pain. 12/01/19   Kinsinger, Arta Bruce, MD  rosuvastatin (CRESTOR) 5 MG tablet Take 5 mg by mouth daily.    [provider]  tadalafil (CIALIS) 5 MG tablet Take 5 mg by mouth daily.     [provider]  TURMERIC CURCUMIN PO Take 1,000 mg by mouth 2 (two) times daily.     [provider]    Physical Exam: Vitals:   11/27/21 1530 11/27/21 1545 11/27/21 1600 11/27/21  1615  BP: (!) 140/91 (!) 147/121 (!) 146/134 122/82  Pulse: (!) 137 (!) 138 71 70  Resp: (!) 29 (!) '21 19 19  '$ Temp:      TempSrc:      SpO2: 99% 99% 98% 99%   General:  Appears calm and comfortable and is in NAD Eyes:  PERRL, EOMI, normal lids, iris ENT:  grossly normal hearing, lips & tongue, mmm; appropriate dentition Neck:  no LAD, masses or thyromegaly; no carotid bruits Cardiovascular:  Rate regular, rhythm irregular, no m/r/g. No LE edema.  Respiratory:   CTA bilaterally with no wheezes/rales/rhonchi.  Normal respiratory effort. Abdomen:  soft, NT, ND, NABS Back:   normal alignment, no  CVAT Skin:  no rash or induration seen on limited exam Musculoskeletal:  grossly normal tone BUE/BLE, good ROM, no bony abnormality Lower extremity:  No LE edema.  Limited foot exam with no ulcerations.  2+ distal pulses. Psychiatric:  grossly normal mood and affect, speech fluent and appropriate, AOx3 Neurologic:  CN 2-12 grossly intact, moves all extremities in coordinated fashion, sensation intact   Radiological Exams on Admission: Independently reviewed - see discussion in A/P where applicable  DG Chest Port 1 View  Result Date: 11/27/2021 CLINICAL DATA:  Weakness EXAM: PORTABLE CHEST 1 VIEW COMPARISON:  03/02/2020 FINDINGS: The heart size and mediastinal contours are within normal limits. Both lungs are clear. The visualized skeletal structures are unremarkable. IMPRESSION: No acute abnormality of the lungs in AP portable projection. Electronically Signed   By: Delanna Ahmadi M.D.   On: 11/27/2021 15:23    EKG: Independently reviewed. Atrial flutter with 2:1 conduction. Rate of 136; nonspecific ST changes with no evidence of acute ischemia   Labs on Admission: I have personally reviewed the available labs and imaging studies at the time of the admission.  Pertinent labs:   None   Assessment and Plan: Principal Problem:   Atrial flutter with rapid ventricular response (HCC) Active  Problems:   Adult attention deficit disorder   BPH (benign prostatic hyperplasia)   Essential hypertension   Hypercholesterolemia   Hypothyroidism   Asthma, chronic    Assessment and Plan: * Atrial flutter with rapid ventricular response ([place]) 81 year old male with history of sudden onset of palpitations found to be in atrial flutter/fib with rates to 140.  -observation to telemetry -echo -rate controlled with one dose of cardizem, but remains in atrial fibrillation -start beta blocker, low dose  -on ASA daily, CHA2DS2-VASC score of 3 discussed anticoagulation and will start eliquis. He does have history of nose bleeds so did discuss we will have to watch this carefully -holding home adderall.  -repeat TSH pending   Asthma, chronic Continue home qvar Will change to xoponex prn instead of albuterol with new atrial fib/flutter with RVR   Hypothyroidism TSH wnl in 07/2021 Repeat pending with new atrial fib/flutter Continue home synthroid at 219mg daily  Hypercholesterolemia Continue crestor   Essential hypertension Well controlled. Holding home norvasc/avapro as bp to goal and getting metoprolol tonight  Monitor pressures with addition of beta blocker and adjust home medication as needed   Adult attention deficit disorder Hold his adderral in setting of new atrial flutter      Advance Care Planning:   Code Status: Full Code   Consults: cardiology   DVT Prophylaxis: eliquis  Family Communication: wife at bedside. Spoke with his daughter who is in internist by phone: Dr. MBurman Nieves   [phone] Severity of Illness: The appropriate patient status for this patient is OBSERVATION. Observation status is judged to be reasonable and necessary in order to provide the required intensity of service to ensure the patient's safety. The patient's presenting symptoms, physical exam findings, and initial radiographic and laboratory data in the context of their medical condition is  felt to place them at decreased risk for further clinical deterioration. Furthermore, it is anticipated that the patient will be medically stable for discharge from the hospital within 2 midnights of admission.   Author: AOrma Flaming MD 11/27/2021 7:05 PM  For on call review [email or web address]

## 2021-11-28 ENCOUNTER — Observation Stay (HOSPITAL_BASED_OUTPATIENT_CLINIC_OR_DEPARTMENT_OTHER): Payer: Medicare PPO

## 2021-11-28 ENCOUNTER — Other Ambulatory Visit (HOSPITAL_COMMUNITY): Payer: Self-pay

## 2021-11-28 DIAGNOSIS — I10 Essential (primary) hypertension: Secondary | ICD-10-CM

## 2021-11-28 DIAGNOSIS — I4891 Unspecified atrial fibrillation: Secondary | ICD-10-CM

## 2021-11-28 DIAGNOSIS — I4892 Unspecified atrial flutter: Secondary | ICD-10-CM | POA: Diagnosis not present

## 2021-11-28 LAB — BASIC METABOLIC PANEL
Anion gap: 9 (ref 5–15)
BUN: 14 mg/dL (ref 8–23)
CO2: 29 mmol/L (ref 22–32)
Calcium: 8.9 mg/dL (ref 8.9–10.3)
Chloride: 99 mmol/L (ref 98–111)
Creatinine, Ser: 1.02 mg/dL (ref 0.61–1.24)
GFR, Estimated: >60 mL/min (ref >60)
Glucose, Bld: 106 mg/dL — ABNORMAL HIGH (ref 70–99)
Potassium: 3.8 mmol/L (ref 3.5–5.1)
Sodium: 137 mmol/L (ref 135–145)

## 2021-11-28 LAB — CBC
HCT: 45.1 % (ref 39.0–52.0)
Hemoglobin: 15.4 g/dL (ref 13.0–17.0)
MCH: 33 pg (ref 26.0–34.0)
MCHC: 34.1 g/dL (ref 30.0–36.0)
MCV: 96.8 fL (ref 80.0–100.0)
Platelets: 239 10*3/uL (ref 150–400)
RBC: 4.66 MIL/uL (ref 4.22–5.81)
RDW: 12.3 % (ref 11.5–15.5)
WBC: 7.7 10*3/uL (ref 4.0–10.5)
nRBC: 0 % (ref 0.0–0.2)

## 2021-11-28 LAB — ECHOCARDIOGRAM COMPLETE
Calc EF: 50.6 %
S' Lateral: 2.8 cm
Single Plane A2C EF: 51 %
Single Plane A4C EF: 50.1 %

## 2021-11-28 MED ORDER — APIXABAN 5 MG PO TABS: 5.0000 mg | ORAL_TABLET | Freq: Two times a day (BID) | ORAL | 1 refills | Status: AC

## 2021-11-28 MED ORDER — BUDESONIDE 0.25 MG/2ML IN SUSP
0.2500 mg | Freq: Two times a day (BID) | RESPIRATORY_TRACT | Status: DC
  Administered 2021-11-28: 0.25 mg via RESPIRATORY_TRACT
  Filled 2021-11-28: qty 2

## 2021-11-28 MED ORDER — METOPROLOL SUCCINATE ER 25 MG PO TB24: 25.0000 mg | ORAL_TABLET | Freq: Every day | ORAL | 11 refills | Status: DC

## 2021-11-28 NOTE — Consult Note (Addendum)
Cardiology Consultation:   Patient ID: Michael Bates MRN: [id number]; DOB: April 29, 1941  Admit date: 11/27/2021 Date of Consult: 11/28/2021  PCP:  Lajean Manes, MD   [place] [facility] Cardiologist:  New to [facility] [name]  Patient Profile:   Michael Bates is a 81 y.o. male with PMH of asthma, HTN, HLD, GERD, asthma, hypothyroidism, SIADH,  ADHD, who is being seen 11/28/2021 for the evaluation of A fib RVR at the request of Dr Rogers Blocker.  History of Present Illness:   Michael Bates with above PMH presented to ER 11/27/21 with heart palpitation. Patient denied any hx of cardiac disease in the past besides hypertension.  He reports being relatively healthy and walks 4 miles daily at a baseline.  He reports that he received COVID-19 vaccine in September 2022. Few hours after that he had developed chest pressure with palpitation, while walking. He recalls that his heart was racing and beating irregularly. The episode lasted about 15 hours. He went to see his PCP who placed him on Zio monitor for 13 days. He brought the report that states he had minimum HR 45 bpm and maximum HR 176bpm with average HR 63 bpm.  Predominant underlying rhythm was sinus rhythm.  First-degree AV block was present.  116 supraventricular tachycardia runs occurred, the run with the fastest interval lasting 9 beats with a max rate of 176 bpm, the longest lasting 12.6 seconds with an average rate of 103 bpm.  Ventricular bigeminy and trigeminy were present.  He recalls that symptom had resolved after 4-5 days.   He woke up this morning around 5 AM with heart palpitation again.  He felt symptom is similar to September episodes.  He felt his heart was racing and beating irregularly, was associated diaphoresis, mild shortness of breath.  He denied any dizziness or syncope, rapid weight gain, peripheral edema.  He is [employer] informed him that he is in atrial fibrillation.  Therefore he came to the ER for evaluation. He  denies any history of stroke or major bleeding.  He suffers intermittent nosebleed from dry mucous.   Diagnostic at admission revealed grossly unremarkable CBC and BMP.  High sensitive troponin negative x2.  TSH WNL.  Chest x-ray revealed no acute finding.  EKG on 11/27/2021 at 1344 Atrial flutter with ventricular rate of 136,2:1 AVB.  He was given IV diltiazem bolus '10mg'$  with his heart rate improved from 130 to 70s.  He states he is no longer feeling heart palpitation or associated symptoms at this time.  He was admitted to hospital medicine service, started on PO metoprolol and Eliquis. TSH WNL. Cardiology is consulted today for further evaluation.    Past Medical History:  Diagnosis Date   Asthma    BPH (benign prostatic hyperplasia)    Cancer (HCC)    hx of skin cancer    Degenerative arthritis of cervical spine    c5-c6   Dysrhythmia    hx of pvcs in the past    Family history of adverse reaction to anesthesia    Father had idosyncratic narcotic  reaction   GERD (gastroesophageal reflux disease)    occ    Hyperlipidemia    Hypertension    Hypothyroidism    Osteopenia    Raynaud disease    Renal disease    stage 3, patient not aware of this diagnosis   Thyroid disease     Past Surgical History:  Procedure Laterality Date   COLONOSCOPY WITH PROPOFOL N/A 01/10/2016  Procedure: COLONOSCOPY WITH PROPOFOL;  Surgeon: Garlan Fair, MD;  Location: [facility] ENDOSCOPY;  Service: Endoscopy;  Laterality: N/A;   HEMORROIDECTOMY     INGUINAL HERNIA REPAIR Bilateral 12/01/2019   Procedure: LAPAROSCOPIC BILATERAL INGUINAL HERNIA REPAIR WITH MESH,UMBILICAL HERNIA REPAIR;  Surgeon: Kinsinger, Arta Bruce, MD;  Location: [facility] ORS;  Service: General;  Laterality: Bilateral;   PAROTIDECTOMY  2017   Dr. Redmond Baseman   PROSTATECTOMY       Home Medications:  Prior to Admission medications   Medication Sig Start Date End Date Taking? Authorizing Provider  acetaminophen (TYLENOL) 500 MG tablet Take 500 mg by  mouth every 6 (six) hours as needed for moderate pain or headache.   Yes [provider]  albuterol (PROVENTIL HFA;VENTOLIN HFA) 108 (90 BASE) MCG/ACT inhaler Inhale 2 puffs into the lungs See admin instructions. Inhale 2 puffs every morning, may inhale 2 puffs every 6 hours as needed for shortness of breath   Yes [provider]  alendronate (FOSAMAX) 70 MG tablet Take 70 mg by mouth once a week. Take on Saturdays 10/23/21  Yes [provider]  amLODipine (NORVASC) 2.5 MG tablet Take 2.5 mg by mouth every evening. 09/20/21  Yes [provider]  beclomethasone (QVAR) 80 MCG/ACT inhaler Inhale 1 puff into the lungs 2 (two) times daily.   Yes [provider]  Bioflavonoid Products (ESTER C PO) Take 500 mg by mouth in the morning and at bedtime.   Yes [provider]  buPROPion (WELLBUTRIN XL) 300 MG 24 hr tablet Take 300 mg by mouth daily. 11/30/15  Yes [provider]  Cholecalciferol (VITAMIN D3) 5000 units CAPS Take 5,000 Units by mouth daily.   Yes [provider]  CINNAMON PO Take 2 tablets by mouth every evening.   Yes [provider]  Coenzyme Q10 300 MG CAPS Take 300 mg by mouth daily.   Yes [provider]  Collagen-Boron-Hyaluronic Acid ([employer] JOINT HEALTH TRIPLE ACTION PO) Take 1 tablet by mouth daily.   Yes [provider]  irbesartan (AVAPRO) 75 MG tablet Take 75 mg by mouth at bedtime.   Yes [provider]  Ketotifen Fumarate (ALAWAY OP) Place 1 drop into both eyes daily as needed (allergies).   Yes [provider]  levothyroxine (SYNTHROID, LEVOTHROID) 200 MCG tablet Take 200 mcg by mouth every morning. 12/30/15  Yes [provider]  methylphenidate 18 MG PO CR tablet Take 18 mg by mouth daily. 12/29/15  Yes [provider]  mirabegron ER (MYRBETRIQ) 50 MG TB24 tablet Take 50 mg by mouth daily.   Yes [provider]  Multiple Vitamins-Minerals (CENTRUM  ULTRA MENS PO) Take 1 tablet by mouth daily.    Yes [provider]  Multiple Vitamins-Minerals (ICAPS AREDS 2 PO) Take 1 tablet by mouth every evening.   Yes [provider]  Omega-3 Fatty Acids (OMEGA 3 500 PO) Take 500 mg by mouth in the morning and at bedtime.   Yes [provider]  QVAR REDIHALER 80 MCG/ACT inhaler Inhale 1 puff into the lungs 2 (two) times daily. 10/05/21  Yes [provider]  rosuvastatin (CRESTOR) 5 MG tablet Take 5 mg by mouth daily.   Yes [provider]  tadalafil (CIALIS) 5 MG tablet Take 5 mg by mouth daily.   Yes [provider]  TURMERIC CURCUMIN PO Take 1,000 mg by mouth 2 (two) times daily.    Yes [provider]  ibuprofen (ADVIL) 800 MG tablet Take  1 tablet (800 mg total) by mouth every 8 (eight) hours as needed. Patient not taking: Reported on 11/27/2021 12/01/19   Kinsinger, Arta Bruce, MD    Inpatient Medications: Scheduled Meds:  apixaban  5 mg Oral BID   budesonide (PULMICORT) nebulizer solution  0.25 mg Nebulization BID   buPROPion  300 mg Oral Daily   levothyroxine  200 mcg Oral q morning   metoprolol tartrate  12.5 mg Oral BID   mirabegron ER  50 mg Oral Daily   rosuvastatin  5 mg Oral Daily   tadalafil  5 mg Oral Daily   Continuous Infusions:  PRN Meds: acetaminophen, levalbuterol, ondansetron (ZOFRAN) IV  Allergies:    Allergies  Allergen Reactions   Lisinopril Cough   Tape Other (See Comments)    RED IRRITATION IF LEFT ON LONG TIME    Social History:   Social History   Socioeconomic History   Marital status: Married    Spouse name: Not on file   Number of children: 3   Years of education: 20   Highest education level: Not on file  Occupational History   Not on file  Tobacco Use   Smoking status: Former    Packs/day: 1.00    Years: 20.00    Total pack years: 20.00    Types: Cigarettes, Pipe    Quit date: 1979    Years since quitting: 44.4   Smokeless tobacco:  Never  Vaping Use   Vaping Use: Never used  Substance and Sexual Activity   Alcohol use: Yes    Alcohol/week: 7.0 standard drinks of alcohol    Types: 7 Glasses of wine per week   Drug use: No   Sexual activity: Yes  Other Topics Concern   Not on file  Social History Narrative   Not on file   Social Determinants of Health   Financial Resource Strain: Not on file  Food Insecurity: Not on file  Transportation Needs: No Transportation Needs (02/26/2019)   PRAPARE - Hydrologist (Medical): No    Lack of Transportation (Non-Medical): No  Physical Activity: Not on file  Stress: Not on file  Social Connections: Not on file  Intimate Partner Violence: Not At Risk (02/26/2019)   Humiliation, Afraid, Rape, and Kick questionnaire    Fear of Current or Ex-Partner: No    Emotionally Abused: No    Physically Abused: No    Sexually Abused: No    Family History:   Grandfather: CAD  ROS:  Constitutional: Denied fever, chills, malaise, night sweats Eyes: Denied vision change or loss Ears/Nose/Mouth/Throat: Denied ear ache, sore throat, coughing, sinus pain Cardiovascular: see HPI  Respiratory: see HPI  Gastrointestinal: Denied nausea, vomiting, abdominal pain, diarrhea Genital/Urinary: Denied dysuria, hematuria, urinary frequency/urgency Musculoskeletal: Denied muscle ache, joint pain, weakness Skin: Denied rash, wound Neuro: Denied headache, dizziness, syncope Psych: history of depression/anxiety  Endocrine: Denied history of diabetes   Physical Exam/Data:   Vitals:   11/27/21 2300 11/28/21 0200 11/28/21 0534 11/28/21 0800  BP: (!) 146/88 129/81 134/83 (!) 122/94  Pulse: 66 68 73 83  Resp: '18 16 18 15  '$ Temp:      TempSrc:      SpO2: 97% 98% 100% 100%   No intake or output data in the 24 hours ending 11/28/21 1144    11/28/2019   11:53 AM 04/29/2019    2:36 PM 01/10/2016    9:48 AM  Last 3 Weights  Weight (lbs) 155 lb  7 oz 162 lb 155 lb  Weight  (kg) 70.506 kg 73.483 kg 70.308 kg     There is no height or weight on file to calculate BMI.  Vitals:  Vitals:   11/28/21 0534 11/28/21 0800  BP: 134/83 (!) 122/94  Pulse: 73 83  Resp: 18 15  Temp:    SpO2: 100% 100%   General Appearance: In no apparent distress, laying in bed HEENT: Normocephalic, atraumatic. Neck: Supple, trachea midline, no JVDs Cardiovascular: Regular rate and rhythm, normal S1-S2,  no murmur/rub/gallop Respiratory: Resting breathing unlabored, lungs sounds clear to auscultation bilaterally, no use of accessory muscles. On room air.  No wheezes, rales or rhonchi.   Gastrointestinal: Bowel sounds positive, abdomen soft, non-tender Extremities: Able to move all extremities in bed without difficulty, no edema Musculoskeletal: Normal muscle bulk and tone Skin: Intact, warm, dry. No rashes or petechiae noted in exposed areas.  Neurologic: Alert, oriented to person, place and time. Fluent speech, no facial droop, no cognitive deficit Psychiatric: Normal affect. Mood is appropriate.    EKG:  The EKG was personally reviewed and demonstrates:   EKG from 11/27/21 13:44 A flutter with RVR 136 bpm 2:1 AVB EKG 11/27/21 16:15 A flutter with VR 84 bpm 2:1 AVB EKG 11/28/21 A flutter with VR 73 bpm, 2:1 AVB  Telemetry:  Telemetry was personally reviewed and demonstrates:  A flutter with controlled VR 70s   Relevant CV Studies:  Echo is pending today   Laboratory Data:  High Sensitivity Troponin:   Recent Labs  Lab 11/27/21 1350 11/27/21 1551  TROPONINIHS 9 9     Chemistry Recent Labs  Lab 11/27/21 1350 11/28/21 0552  NA 135 137  K 4.7 3.8  CL 97* 99  CO2 28 29  GLUCOSE 104* 106*  BUN 19 14  CREATININE 0.95 1.02  CALCIUM 9.4 8.9  MG 2.2  --   GFRNONAA >60 >60  ANIONGAP 10 9    No results for input(s): "PROT", "ALBUMIN", "AST", "ALT", "ALKPHOS", "BILITOT" in the last 168 hours. Lipids No results for input(s): "CHOL", "TRIG", "HDL", "LABVLDL", "LDLCALC",  "CHOLHDL" in the last 168 hours.  Hematology Recent Labs  Lab 11/27/21 1350 11/28/21 0552  WBC 5.8 7.7  RBC 4.72 4.66  HGB 15.6 15.4  HCT 45.3 45.1  MCV 96.0 96.8  MCH 33.1 33.0  MCHC 34.4 34.1  RDW 12.3 12.3  PLT 256 239   Thyroid  Recent Labs  Lab 11/27/21 1718  TSH 2.962    BNPNo results for input(s): "BNP", "PROBNP" in the last 168 hours.  DDimer No results for input(s): "DDIMER" in the last 168 hours.   Radiology/Studies:  DG Chest Port 1 View  Result Date: 11/27/2021 CLINICAL DATA:  Weakness EXAM: PORTABLE CHEST 1 VIEW COMPARISON:  03/02/2020 FINDINGS: The heart size and mediastinal contours are within normal limits. Both lungs are clear. The visualized skeletal structures are unremarkable. IMPRESSION: No acute abnormality of the lungs in AP portable projection. Electronically Signed   By: Delanna Ahmadi M.D.   On: 11/27/2021 15:23     Assessment and Plan:   Atrial flutter with RVR, new - had episode of heart palpitation with tachycardia and irregular heart beat 02/2021, Zio monitor x13 days at the time showed sustained non-sustained SVT without A fib, first degree AVB; symptoms resolved after 4-5 days  - presented with heart palpitation and SOB and tachycardiac with irregular beats today starting 5 AM,  symptoms resolved now after rate control achieved  - remains  in atrial flutter 2:1 AVB currently  - TSH WNL, K and Mag at goal - will check Echo  - CHA2DS2-VASc score is 3, started on anticoagulation by hospitalist team with Eliquis '5mg'$ , agree with anticoagulation, discussed bleeding monitor  -Rate control is achieved with AV nodal blocking agent, would transition metoprolol to 25 mg XL daily at time of discharge -arranged follow-up with atrial fibrillation clinic appointment on 12/05/21 at 10:30AM,  if remains in a flutter, may consider cardioversion  HTN - BP controlled on amlodipine and irbesartan at home, started on metoprolol here, may drop amlodipine 2.'5mg'$  going  forward to avoid hypotension   HLD - on statin   Depression/anxiety SIADH Asthma Hypothyroidism  - per internal medicine     Risk Assessment/Risk Scores:   CHA2DS2-VASc Score = 3   This indicates a 3.2% annual risk of stroke. The patient's score is based upon: CHF History: 0 HTN History: 1 Diabetes History: 0 Stroke History: 0 Vascular Disease History: 0 Age Score: 2 Gender Score: 0     For questions or updates, please contact [place] Please consult [email or web address] for contact info under    Signed, Margie Billet, NP  11/28/2021 11:44 AM   ---------------------------------------------------------------------------------------------   History and all data above reviewed.  Patient examined.  I agree with the findings as above.  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  He notes a history of palpitations after receiving the bivalent COVID-vaccine in the fall, and underwent a cardiovascular work-up which she reports was significant for SVT on monitor with no atrial fibrillation and echocardiogram which by his report was unremarkable.  Presents with palpitations since yesterday morning at 5 AM noted to be in atrial flutter.  Has hypothyroidism treated with Synthroid, mild to moderate persistent asthma.  Presentation was significant for mild shortness of breath and diaphoresis without dizziness or syncope.  No signs of heart failure.  Constitutional: No acute distress Eyes: pupils equally round and reactive to light, sclera non-icteric, normal conjunctiva and lids ENMT: normal dentition, moist mucous membranes Cardiovascular: Irregular rhythm, normal rate, no murmurs. S1 and S2 normal. Radial pulses normal bilaterally. No jugular venous distention.  Respiratory: clear to auscultation bilaterally GI : normal bowel sounds, soft and nontender. No distention.   MSK: extremities warm, well  perfused. No edema.  NEURO: grossly nonfocal exam, moves all extremities. PSYCH: alert and oriented x 3, normal mood and affect.   All available labs, radiology testing, previous records reviewed. Agree with documented assessment and plan of my colleague as stated above with the following additions or changes:  Principal Problem:   Atrial flutter with rapid ventricular response (HCC) Active Problems:   Adult attention deficit disorder   BPH (benign prostatic hyperplasia)   Essential hypertension   Hypercholesterolemia   Hypothyroidism   Asthma, chronic    Plan:  We discussed the natural history of atrial fibrillation and strategies for management including rate and rhythm control as well as stroke risk reduction with anticoagulation.  We also discussed referral to A-fib clinic for consideration of cardioversion in 3 to 4 weeks at their discretion.  We reviewed risk factors for atrial fibrillation including alcohol consumption, laboratory abnormalities such as thyroid disease, as well as sleep apnea particularly when untreated.  He does not have sleep apnea from recent evaluation.  We also discussed options for anticoagulation including direct oral anticoagulants. We reviewed the patient's personal history with regard to risk stratification  for bleeding events.  Echocardiogram unremarkable.  Okay to dismiss home on metoprolol succinate 25 mg daily, Eliquis 5 mg twice daily.  Plan for atrial fibrillation clinic appointment 12/05/2021.  I will plan to see the patient back in follow-up personally in 2 to 3 months.  Anticipate cardioversion in 4 weeks after continuous anticoagulation if he remains in atrial flutter.  Discussed with primary service who plans to dismiss him shortly.  I called the patient's daughter Dr. Stann Ore and discussed findings and follow-up plan.  Length of Stay:  LOS: 0 days   Elouise Munroe, MD [facility] 4:30 PM  11/28/2021

## 2021-11-28 NOTE — TOC Benefit Eligibility Note (Signed)
Patient Research scientist (life sciences) completed.     The patient is currently admitted and upon discharge could be taking Eliquis 5 mg.   The current 30 day co-pay is, $40.   The patient is insured through [employer].

## 2021-11-28 NOTE — Progress Notes (Signed)
  Echocardiogram 2D Echocardiogram has been performed.  Fidel Levy 11/28/2021, 2:30 PM

## 2021-11-28 NOTE — ED Notes (Signed)
Cardiology PA at bedside now

## 2021-11-28 NOTE — ED Notes (Addendum)
Ambulating patient in hallway heartrate 86 o2 98% roomair.patient walked well.back to room into bed.

## 2021-11-28 NOTE — Discharge Summary (Signed)
Physician Discharge Summary  Michael Bates PPI:[id number] DOB: 1941-02-05 DOA: 11/27/2021  PCP: Lajean Manes, MD  Admit date: 11/27/2021 Discharge date: 11/28/2021  Admitted From: Home Disposition:  HOME   Recommendations for Outpatient Follow-up:  Follow up with PCP in 1-2 weeks Follow up with cardiology as scheduled  Home Health:None  Equipment/Devices:None  Discharge Condition:Stable  CODE STATUS:Full  Diet recommendation: Cardiac    Brief/Interim Summary: Michael Bates is a 81 y.o. male with medical history significant of asthma, PVCs, GERD, HTN, HLD, hypothyroidism, adult ADD, SIADH who presented to ED with complaints of palpitations. This morning around 5AM he woke up with palpitations and his apple watch said atrial fibrillation. Rate was up to 140s. He felt weak, dizzy and nauseated. Denied any chest pain or shortness of breath. He went to [facility] urgent care where they sent him to the ED.    History of palpitations in the fall and appears it was SVT. He has holter recorder which showed 3.4% SVT burden with 1st degree AV block. He does take an ASA daily.    Patient evaluated by cardiology, a flutter with 2-1 response improved drastically with metoprolol, transition to Eliquis continue home medications with no other changes.  Labs, EKG, echocardiogram reassuring, follow-up outpatient with cardiology clinic tentatively on the 19th just next week for further evaluation and treatment.  Patient otherwise stable and agreeable for discharge home, close follow-up outpatient as scheduled.  Discharge Diagnoses:  Principal Problem:   Atrial flutter with rapid ventricular response (HCC) Active Problems:   Adult attention deficit disorder   BPH (benign prostatic hyperplasia)   Essential hypertension   Hypercholesterolemia   Hypothyroidism   Asthma, chronic    Discharge Instructions  Discharge Instructions     Amb referral to AFIB Clinic   Complete by: As directed     Discharge patient   Complete by: As directed    Discharge disposition: 01-Home or Self Care   Discharge patient date: 11/28/2021      Allergies as of 11/28/2021       Reactions   Lisinopril Cough   Tape Other (See Comments)   RED IRRITATION IF LEFT ON LONG TIME        Medication List     STOP taking these medications    beclomethasone 80 MCG/ACT inhaler Commonly known as: QVAR       TAKE these medications    acetaminophen 500 MG tablet Commonly known as: TYLENOL Take 500 mg by mouth every 6 (six) hours as needed for moderate pain or headache.   ALAWAY OP Place 1 drop into both eyes daily as needed (allergies).   albuterol 108 (90 Base) MCG/ACT inhaler Commonly known as: VENTOLIN HFA Inhale 2 puffs into the lungs See admin instructions. Inhale 2 puffs every morning, may inhale 2 puffs every 6 hours as needed for shortness of breath   alendronate 70 MG tablet Commonly known as: FOSAMAX Take 70 mg by mouth once a week. Take on Saturdays   amLODipine 2.5 MG tablet Commonly known as: NORVASC Take 2.5 mg by mouth every evening.   apixaban 5 MG Tabs tablet Commonly known as: ELIQUIS Take 1 tablet (5 mg total) by mouth 2 (two) times daily.   buPROPion 300 MG 24 hr tablet Commonly known as: WELLBUTRIN XL Take 300 mg by mouth daily.   ICAPS AREDS 2 PO Take 1 tablet by mouth every evening.   CENTRUM ULTRA MENS PO Take 1 tablet by mouth daily.   CINNAMON PO  Take 2 tablets by mouth every evening.   Coenzyme Q10 300 MG Caps Take 300 mg by mouth daily.   [employer] JOINT HEALTH TRIPLE ACTION PO Take 1 tablet by mouth daily.   ESTER C PO Take 500 mg by mouth in the morning and at bedtime.   irbesartan 75 MG tablet Commonly known as: AVAPRO Take 75 mg by mouth at bedtime.   levothyroxine 200 MCG tablet Commonly known as: SYNTHROID Take 200 mcg by mouth every morning.   methylphenidate 18 MG CR tablet Commonly known as: CONCERTA Take 18 mg by mouth daily.    metoprolol succinate 25 MG 24 hr tablet Commonly known as: Toprol XL Take 1 tablet (25 mg total) by mouth daily.   Myrbetriq 50 MG Tb24 tablet Generic drug: mirabegron ER Take 50 mg by mouth daily.   OMEGA 3 500 PO Take 500 mg by mouth in the morning and at bedtime.   Qvar RediHaler 80 MCG/ACT inhaler Generic drug: beclomethasone Inhale 1 puff into the lungs 2 (two) times daily.   rosuvastatin 5 MG tablet Commonly known as: CRESTOR Take 5 mg by mouth daily.   tadalafil 5 MG tablet Commonly known as: CIALIS Take 5 mg by mouth daily.   TURMERIC CURCUMIN PO Take 1,000 mg by mouth 2 (two) times daily.   Vitamin D3 125 MCG (5000 UT) Caps Take 5,000 Units by mouth daily.        Allergies  Allergen Reactions   Lisinopril Cough   Tape Other (See Comments)    RED IRRITATION IF LEFT ON LONG TIME    Consultations: Cardiology   Procedures/Studies: ECHOCARDIOGRAM COMPLETE  Result Date: 11/28/2021    ECHOCARDIOGRAM REPORT   Patient Name:   Michael Bates Date of Exam: 11/28/2021 Medical Rec #:  [id number]         Height:       71.0 in Accession #:    [id number]        Weight:       155.4 lb Date of Birth:  Jun 21, 1940          BSA:          1.894 m Patient Age:    37 years          BP:           132/83 mmHg Patient Gender: M                 HR:           77 bpm. Exam Location:  Inpatient Procedure: 2D Echo, Cardiac Doppler and Color Doppler Indications:    I48.91* Unspeicified atrial fibrillation  History:        Patient has no prior history of Echocardiogram examinations.                 Risk Factors:Hypertension, Dyslipidemia and GERD.  Sonographer:    Bernadene Person RDCS Referring Phys: [id number] Margie Billet IMPRESSIONS  1. Left ventricular ejection fraction, by estimation, is 60 to 65%. The left ventricle has normal function. The left ventricle has no regional wall motion abnormalities. There is mild left ventricular hypertrophy. Left ventricular diastolic parameters are  indeterminate.  2. Right ventricular systolic function is normal. The right ventricular size is normal. There is normal pulmonary artery systolic pressure.  3. The mitral valve is grossly normal. Trivial mitral valve regurgitation.  4. The aortic valve is tricuspid. Aortic valve regurgitation is not visualized.  5. Aortic no significant ascending aneurysm.  6. The inferior vena cava is normal in size with greater than 50% respiratory variability, suggesting right atrial pressure of 3 mmHg. Comparison(s): No prior Echocardiogram. FINDINGS  Left Ventricle: Left ventricular ejection fraction, by estimation, is 60 to 65%. The left ventricle has normal function. The left ventricle has no regional wall motion abnormalities. The left ventricular internal cavity size was normal in size. There is  mild left ventricular hypertrophy. Left ventricular diastolic parameters are indeterminate. Right Ventricle: The right ventricular size is normal. No increase in right ventricular wall thickness. Right ventricular systolic function is normal. There is normal pulmonary artery systolic pressure. The tricuspid regurgitant velocity is 1.54 m/s, and  with an assumed right atrial pressure of 3 mmHg, the estimated right ventricular systolic pressure is 60.4 mmHg. Left Atrium: Left atrial size was normal in size. Right Atrium: Right atrial size was normal in size. Pericardium: There is no evidence of pericardial effusion. Mitral Valve: The mitral valve is grossly normal. Trivial mitral valve regurgitation. Tricuspid Valve: The tricuspid valve is normal in structure. Tricuspid valve regurgitation is trivial. Aortic Valve: The aortic valve is tricuspid. Aortic valve regurgitation is not visualized. Pulmonic Valve: The pulmonic valve was not well visualized. Pulmonic valve regurgitation is not visualized. Aorta: No significant ascending aneurysm. Venous: The inferior vena cava is normal in size with greater than 50% respiratory variability,  suggesting right atrial pressure of 3 mmHg. IAS/Shunts: No atrial level shunt detected by color flow Doppler.  LEFT VENTRICLE PLAX 2D LVIDd:         4.20 cm LVIDs:         2.80 cm LV PW:         1.10 cm LV IVS:        0.80 cm LVOT diam:     2.00 cm LV SV:         38 LV SV Index:   20 LVOT Area:     3.14 cm  LV Volumes (MOD) LV vol d, MOD A2C: 49.4 ml LV vol d, MOD A4C: 54.5 ml LV vol s, MOD A2C: 24.2 ml LV vol s, MOD A4C: 27.2 ml LV SV MOD A2C:     25.2 ml LV SV MOD A4C:     54.5 ml LV SV MOD BP:      26.4 ml RIGHT VENTRICLE TAPSE (M-mode): 2.1 cm LEFT ATRIUM             Index        RIGHT ATRIUM           Index LA diam:        4.40 cm 2.32 cm/m   RA Area:     15.50 cm LA Vol (A2C):   58.6 ml 30.93 ml/m  RA Volume:   36.60 ml  19.32 ml/m LA Vol (A4C):   54.0 ml 28.51 ml/m LA Biplane Vol: 57.1 ml 30.14 ml/m  AORTIC VALVE LVOT Vmax:   69.20 cm/s LVOT Vmean:  44.600 cm/s LVOT VTI:    0.122 m  AORTA Ao Root diam: 2.80 cm Ao Asc diam:  3.20 cm TRICUSPID VALVE TR Peak grad:   9.5 mmHg TR Vmax:        154.00 cm/s  SHUNTS Systemic VTI:  0.12 m Systemic Diam: 2.00 cm Phineas Inches Electronically signed by Phineas Inches Signature Date/Time: 11/28/2021/2:38:41 PM    Final    DG Chest Port 1 View  Result Date: 11/27/2021 CLINICAL DATA:  Weakness EXAM: PORTABLE CHEST 1 VIEW COMPARISON:  03/02/2020  FINDINGS: The heart size and mediastinal contours are within normal limits. Both lungs are clear. The visualized skeletal structures are unremarkable. IMPRESSION: No acute abnormality of the lungs in AP portable projection. Electronically Signed   By: Delanna Ahmadi M.D.   On: 11/27/2021 15:23     Subjective: No acute issues or events overnight denies nausea vomiting diarrhea constipation headache fevers chills or chest pain   Discharge Exam: Vitals:   11/28/21 1600 11/28/21 1604  BP: 119/71 119/71  Pulse: 78 76  Resp: 17 20  Temp:  97.8 F (36.6 C)  SpO2: 100% 98%   Vitals:   11/28/21 0800 11/28/21 1315 11/28/21  1600 11/28/21 1604  BP: (!) 122/94 132/83 119/71 119/71  Pulse: 83 78 78 76  Resp: '15 19 17 20  '$ Temp:    97.8 F (36.6 C)  TempSrc:    Oral  SpO2: 100% 97% 100% 98%    General: Pt is alert, awake, not in acute distress Cardiovascular: Regular rate, 2-1 block flutter on telemetry, rate in the 60/70s Respiratory: CTA bilaterally, no wheezing, no rhonchi Abdominal: Soft, NT, ND, bowel sounds + Extremities: no edema, no cyanosis    The results of significant diagnostics from this hospitalization (including imaging, microbiology, ancillary and laboratory) are listed below for reference.     Microbiology: No results found for this or any previous visit (from the past 240 hour(s)).   Labs: BNP (last 3 results) No results for input(s): "BNP" in the last 8760 hours. Basic Metabolic Panel: Recent Labs  Lab 11/27/21 1350 11/28/21 0552  NA 135 137  K 4.7 3.8  CL 97* 99  CO2 28 29  GLUCOSE 104* 106*  BUN 19 14  CREATININE 0.95 1.02  CALCIUM 9.4 8.9  MG 2.2  --    Liver Function Tests: No results for input(s): "AST", "ALT", "ALKPHOS", "BILITOT", "PROT", "ALBUMIN" in the last 168 hours. No results for input(s): "LIPASE", "AMYLASE" in the last 168 hours. No results for input(s): "AMMONIA" in the last 168 hours. CBC: Recent Labs  Lab 11/27/21 1350 11/28/21 0552  WBC 5.8 7.7  HGB 15.6 15.4  HCT 45.3 45.1  MCV 96.0 96.8  PLT 256 239   Cardiac Enzymes: No results for input(s): "CKTOTAL", "CKMB", "CKMBINDEX", "TROPONINI" in the last 168 hours. BNP: Invalid input(s): "POCBNP" CBG: No results for input(s): "GLUCAP" in the last 168 hours. D-Dimer No results for input(s): "DDIMER" in the last 72 hours. Hgb A1c No results for input(s): "HGBA1C" in the last 72 hours. Lipid Profile No results for input(s): "CHOL", "HDL", "LDLCALC", "TRIG", "CHOLHDL", "LDLDIRECT" in the last 72 hours. Thyroid function studies Recent Labs    11/27/21 1718  TSH 2.962   Anemia work up No  results for input(s): "VITAMINB12", "FOLATE", "FERRITIN", "TIBC", "IRON", "RETICCTPCT" in the last 72 hours. Urinalysis No results found for: "COLORURINE", "APPEARANCEUR", "LABSPEC", "PHURINE", "GLUCOSEU", "HGBUR", "BILIRUBINUR", "KETONESUR", "PROTEINUR", "UROBILINOGEN", "NITRITE", "LEUKOCYTESUR" Sepsis Labs Recent Labs  Lab 11/27/21 1350 11/28/21 0552  WBC 5.8 7.7   Microbiology No results found for this or any previous visit (from the past 240 hour(s)).   Time coordinating discharge: Over 30 minutes  SIGNED:   Little Ishikawa, DO [facility] 11/28/2021, 4:33 PM Pager   If 7PM-7AM, please contact night-coverage [email or web address]

## 2021-12-05 ENCOUNTER — Ambulatory Visit (HOSPITAL_COMMUNITY)
Admit: 2021-12-05 | Discharge: 2021-12-05 | Disposition: A | Discharge status: Home / Self Care | Payer: Medicare PPO | Attending: Nurse Practitioner | Admitting: Nurse Practitioner

## 2021-12-05 ENCOUNTER — Encounter (HOSPITAL_COMMUNITY): Payer: Self-pay | Admitting: Nurse Practitioner

## 2021-12-05 VITALS — BP 136/72 | HR 71 | Ht 71.0 in | Wt 152.8 lb

## 2021-12-05 DIAGNOSIS — K219 Gastro-esophageal reflux disease without esophagitis: Secondary | ICD-10-CM | POA: Insufficient documentation

## 2021-12-05 DIAGNOSIS — I4891 Unspecified atrial fibrillation: Secondary | ICD-10-CM | POA: Diagnosis not present

## 2021-12-05 DIAGNOSIS — I10 Essential (primary) hypertension: Secondary | ICD-10-CM | POA: Insufficient documentation

## 2021-12-05 DIAGNOSIS — Z7901 Long term (current) use of anticoagulants: Secondary | ICD-10-CM | POA: Diagnosis not present

## 2021-12-05 DIAGNOSIS — D6869 Other thrombophilia: Secondary | ICD-10-CM | POA: Diagnosis not present

## 2021-12-05 DIAGNOSIS — Z79899 Other long term (current) drug therapy: Secondary | ICD-10-CM | POA: Diagnosis not present

## 2021-12-05 DIAGNOSIS — I4892 Unspecified atrial flutter: Secondary | ICD-10-CM | POA: Diagnosis not present

## 2021-12-05 DIAGNOSIS — Z8679 Personal history of other diseases of the circulatory system: Secondary | ICD-10-CM | POA: Insufficient documentation

## 2021-12-05 DIAGNOSIS — E785 Hyperlipidemia, unspecified: Secondary | ICD-10-CM | POA: Diagnosis not present

## 2021-12-05 NOTE — Progress Notes (Addendum)
Primary Care Physician: Michael Manes, MD Referring Physician: ER f/u Cardiologist: Dr. Carolynn Bates is a 81 y.o. male with a h/o SVT episode in 2022, hours after receiving Covid vaccine,  HTN, aflutter/afib just dx in the ER 11/27/21. He was discharged in afib with BB/eliquis on board with a CHA2DS2VASc  score of 3. Echo in the hospital showed normal EF. Monitor at time of SVT in 2022 showed  minimum HR 45 bpm and maximum HR 176bpm with average HR 63 bpm.  Predominant underlying rhythm was sinus rhythm.  First-degree AV block was present.  116 supraventricular tachycardia runs occurred, the run with the fastest interval lasting 9 beats with a max rate of 176 bpm, the longest lasting 12.6 seconds with an average rate of 103 bpm.  Ventricular bigeminy and trigeminy were present  He was referred to the afib clinic after recent ER visit with aflutter/afib . Dr. Margaretann Bates saw pt on consult in the hospital and he is scheduled to see her in August. He is in rate controlled afib today. He showed me strips on his phone  that showed intermittent SR. He is not overly symptomatic with afib but has noticed some fatigue. He walks 4 miles a day, normal BMI, no tobacco. He drinks 1-2 glasses of wine nightly.  Sleep study in the past did not show any sleep apnea.  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    Today, he denies symptoms of palpitations, chest pain, shortness of breath, orthopnea, PND, lower extremity edema, dizziness, presyncope, syncope, or neurologic sequela. The patient is tolerating medications without difficulties and is otherwise without complaint today.   Past Medical History:  Diagnosis Date   Asthma    BPH (benign prostatic hyperplasia)    Cancer (HCC)    hx of skin cancer    Degenerative arthritis of cervical spine    c5-c6   Dysrhythmia    hx of pvcs in the past    Family history of adverse reaction to anesthesia    Father had idosyncratic  narcotic  reaction   GERD (gastroesophageal reflux disease)    occ    Hyperlipidemia    Hypertension    Hypothyroidism    Osteopenia    Raynaud disease    Renal disease    stage 3, patient not aware of this diagnosis   Thyroid disease    Past Surgical History:  Procedure Laterality Date   COLONOSCOPY WITH PROPOFOL N/A 01/10/2016   Procedure: COLONOSCOPY WITH PROPOFOL;  Surgeon: Garlan Fair, MD;  Location: [facility];  Service: Endoscopy;  Laterality: N/A;   HEMORROIDECTOMY     INGUINAL HERNIA REPAIR Bilateral 12/01/2019   Procedure: LAPAROSCOPIC BILATERAL INGUINAL HERNIA REPAIR WITH MESH,UMBILICAL HERNIA REPAIR;  Surgeon: Kieth Brightly, Arta Bruce, MD;  Location: [facility] ORS;  Service: General;  Laterality: Bilateral;   PAROTIDECTOMY  2017   Dr. Redmond Baseman   PROSTATECTOMY      Current Outpatient Medications  Medication Sig Dispense Refill   acetaminophen (TYLENOL) 500 MG tablet Take 500 mg by mouth every 6 (six) hours as needed for moderate pain or headache.     albuterol (PROVENTIL HFA;VENTOLIN HFA) 108 (90 BASE) MCG/ACT inhaler Inhale 2 puffs into the lungs See admin instructions. Inhale 2 puffs every morning, may inhale 2 puffs every 6 hours as needed for shortness of breath     alendronate (FOSAMAX) 70 MG tablet Take 70 mg by mouth once a week. Take on Saturdays  amLODipine (NORVASC) 2.5 MG tablet Take 2.5 mg by mouth every evening.     apixaban (ELIQUIS) 5 MG TABS tablet Take 1 tablet (5 mg total) by mouth 2 (two) times daily. 60 tablet 1   Bioflavonoid Products (ESTER C PO) Take 500 mg by mouth in the morning and at bedtime.     buPROPion (WELLBUTRIN XL) 300 MG 24 hr tablet Take 300 mg by mouth daily.     Cholecalciferol (VITAMIN D3) 5000 units CAPS Take 5,000 Units by mouth daily.     CINNAMON PO Take 2 tablets by mouth every evening.     Coenzyme Q10 300 MG CAPS Take 300 mg by mouth daily.     Collagen-Boron-Hyaluronic Acid ([employer]S JOINT HEALTH TRIPLE ACTION PO) Take 1 tablet by  mouth daily.     irbesartan (AVAPRO) 75 MG tablet Take 75 mg by mouth at bedtime.     Ketotifen Fumarate (ALAWAY OP) Place 1 drop into both eyes daily as needed (allergies).     levothyroxine (SYNTHROID, LEVOTHROID) 200 MCG tablet Take 200 mcg by mouth every morning.     methylphenidate 18 MG PO CR tablet Take 18 mg by mouth daily.     metoprolol succinate (TOPROL XL) 25 MG 24 hr tablet Take 1 tablet (25 mg total) by mouth daily. 30 tablet 11   mirabegron ER (MYRBETRIQ) 50 MG TB24 tablet Take 50 mg by mouth daily.     Multiple Vitamins-Minerals (CENTRUM ULTRA MENS PO) Take 1 tablet by mouth daily.      Multiple Vitamins-Minerals (ICAPS AREDS 2 PO) Take 1 tablet by mouth every evening.     Omega-3 Fatty Acids (OMEGA 3 500 PO) Take 500 mg by mouth in the morning and at bedtime.     QVAR REDIHALER 80 MCG/ACT inhaler Inhale 1 puff into the lungs 2 (two) times daily.     rosuvastatin (CRESTOR) 5 MG tablet Take 5 mg by mouth daily.     tadalafil (CIALIS) 5 MG tablet Take 5 mg by mouth daily.     TURMERIC CURCUMIN PO Take 1,000 mg by mouth 2 (two) times daily.      No current facility-administered medications for this encounter.    Allergies  Allergen Reactions   Lisinopril Cough   Tape Other (See Comments)    RED IRRITATION IF LEFT ON LONG TIME    Social History   Socioeconomic History   Marital status: Married    Spouse name: Not on file   Number of children: 3   Years of education: 20   Highest education level: Not on file  Occupational History   Not on file  Tobacco Use   Smoking status: Former    Packs/day: 1.00    Years: 20.00    Total pack years: 20.00    Types: Cigarettes, Pipe    Quit date: 84    Years since quitting: 44.4   Smokeless tobacco: Never  Vaping Use   Vaping Use: Never used  Substance and Sexual Activity   Alcohol use: Yes    Alcohol/week: 7.0 standard drinks of alcohol    Types: 7 Glasses of wine per week   Drug use: No   Sexual activity: Yes   Other Topics Concern   Not on file  Social History Narrative   Not on file   Social Determinants of Health   Financial Resource Strain: Not on file  Food Insecurity: Not on file  Transportation Needs: No Transportation Needs (02/26/2019)   PRAPARE -  Hydrologist (Medical): No    Lack of Transportation (Non-Medical): No  Physical Activity: Not on file  Stress: Not on file  Social Connections: Not on file  Intimate Partner Violence: Not At Risk (02/26/2019)   Humiliation, Afraid, Rape, and Kick questionnaire    Fear of Current or Ex-Partner: No    Emotionally Abused: No    Physically Abused: No    Sexually Abused: No    No family history on file.  ROS- All systems are reviewed and negative except as per the HPI above  Physical Exam: Vitals:   12/05/21 1026  BP: 136/72  Pulse: 71  Weight: 69.3 kg  Height: '5\' 11"'$  (1.803 m)   Wt Readings from Last 3 Encounters:  12/05/21 69.3 kg  11/28/19 70.5 kg  04/29/19 73.5 kg    Labs: Lab Results  Component Value Date   NA 137 11/28/2021   K 3.8 11/28/2021   CL 99 11/28/2021   CO2 29 11/28/2021   GLUCOSE 106 (H) 11/28/2021   BUN 14 11/28/2021   CREATININE 1.02 11/28/2021   CALCIUM 8.9 11/28/2021   MG 2.2 11/27/2021   No results found for: "INR" No results found for: "CHOL", "HDL", "[place]", "TRIG"   GEN- The patient is well appearing, alert and oriented x 3 today.   Head- normocephalic, atraumatic Eyes-  Sclera clear, conjunctiva pink Ears- hearing intact Oropharynx- clear Neck- supple, no JVP Lymph- no cervical lymphadenopathy Lungs- Clear to ausculation bilaterally, normal work of breathing Heart- Regular rate and rhythm, no murmurs, rubs or gallops, PMI not laterally displaced GI- soft, NT, ND, + BS Extremities- no clubbing, cyanosis, or edema MS- no significant deformity or atrophy Skin- no rash or lesion Psych- euthymic mood, full affect Neuro- strength and sensation are  intact  EKG-afib at 71 bpm  Echo-Edman, Clary L  Procedures:  ECHO COMPLETE WO IMAGING ENHANCING AGENT  Height:  Not recorded  Weight:  Not recorded  Blood Pressure:  Not recorded   Accession Number:  [id number]  Date of Study:  11/28/21  Ordering Provider:  Margie Billet, NP  Clinical Indications:  None Listed     Interpreting Physicians  Performing Staff  Janina Mayo, MD Tech:  Fidel Levy, RVT     ECHOCARDIOGRAM COMPLETE (Accession [id number]) (Order [id number]) Echocardiography Date: 11/28/2021 Department: [facility] Released By/Authorizing: Margie Billet, NP (auto-released)    ECHOCARDIOGRAM COMPLETE Order #: [id number] Accession #: [id number]    Patient Info  Patient name: Michael Bates  MRN: [id number]  Age: 81 y.o.  Sex: male    MyChart Results Release  MyChart Status: Active  Results Release       Vitals  BP Height Weight BSA (Calculated - sq m)          Order-Level Documents:  Scan on 11/28/2021 2:39 PM by Default, Provider, MD        Study Result      ECHOCARDIOGRAM REPORT         Patient Name:   Michael Bates Date of Exam: 11/28/2021  Medical Rec #:  [id number]         Height:       71.0 in  Accession #:    [id number]        Weight:       155.4 lb  Date of Birth:  1940-06-30          BSA:  1.894 m  Patient Age:    75 years          BP:           132/83 mmHg  Patient Gender: M                 HR:           77 bpm.  Exam Location:  Inpatient   Procedure: 2D Echo, Cardiac Doppler and Color Doppler   Indications:    I48.91* Unspeicified atrial fibrillation     History:        Patient has no prior history of Echocardiogram  examinations.                  Risk Factors:Hypertension, Dyslipidemia and GERD.     Sonographer:    Bernadene Person RDCS  Referring Phys: [id number] Margie Billet   IMPRESSIONS     1. Left ventricular ejection fraction, by estimation, is 60 to 65%. The   left ventricle has normal function. The left ventricle has no regional  wall motion abnormalities. There is mild left ventricular hypertrophy.  Left ventricular diastolic parameters  are indeterminate.   2. Right ventricular systolic function is normal. The right ventricular  size is normal. There is normal pulmonary artery systolic pressure.   3. The mitral valve is grossly normal. Trivial mitral valve  regurgitation.   4. The aortic valve is tricuspid. Aortic valve regurgitation is not  visualized.   5. Aortic no significant ascending aneurysm.   6. The inferior vena cava is normal in size with greater than 50%  respiratory variability, suggesting right atrial pressure of 3 mmHg    Assessment and Plan:  1. Afib/flutter  He is in rate controlled afib today General education re afib  Triggers discussed  He will continue metoprolol succinate at  25 mg daily  He will keep a chart to  track occurrence of afib and SR to further understand afib burden with aid  of his watch to track afib  I would advise to avoid alcohol  2. CHA2DS2VASc  score of 3 Continue eliquis 5 mg bid Bleeding precautions discussed I would stop tumeric and fish oil on eliquis  to decrease bleeding risk   I will see back in 3 weeks If still  in PAF with high afib burden, I will consider referring for a front line ablation as he appears to be a good candidate  If persistent then cardioversion will be in order   [place]. Namine Beahm, [facility] [address] [place], [place] [address] [phone]

## 2021-12-07 ENCOUNTER — Emergency Department (HOSPITAL_COMMUNITY): Payer: Medicare PPO

## 2021-12-07 ENCOUNTER — Encounter (HOSPITAL_COMMUNITY): Payer: Self-pay

## 2021-12-07 ENCOUNTER — Emergency Department (HOSPITAL_COMMUNITY)
Admission: EM | Admit: 2021-12-07 | Discharge: 2021-12-07 | Disposition: A | Discharge status: Home / Self Care | Payer: Medicare PPO | Attending: Emergency Medicine | Admitting: Emergency Medicine

## 2021-12-07 DIAGNOSIS — Z79899 Other long term (current) drug therapy: Secondary | ICD-10-CM | POA: Diagnosis not present

## 2021-12-07 DIAGNOSIS — I10 Essential (primary) hypertension: Secondary | ICD-10-CM | POA: Diagnosis not present

## 2021-12-07 DIAGNOSIS — Z7901 Long term (current) use of anticoagulants: Secondary | ICD-10-CM | POA: Insufficient documentation

## 2021-12-07 DIAGNOSIS — R002 Palpitations: Secondary | ICD-10-CM | POA: Insufficient documentation

## 2021-12-07 DIAGNOSIS — I4891 Unspecified atrial fibrillation: Secondary | ICD-10-CM | POA: Diagnosis not present

## 2021-12-07 HISTORY — DX: Unspecified atrial fibrillation: I48.91

## 2021-12-07 LAB — CBC WITH DIFFERENTIAL/PLATELET
Abs Immature Granulocytes: 0.05 10*3/uL (ref 0.00–0.07)
Basophils Absolute: 0.1 10*3/uL (ref 0.0–0.1)
Basophils Relative: 1 %
Eosinophils Absolute: 0.2 10*3/uL (ref 0.0–0.5)
Eosinophils Relative: 3 %
HCT: 40.6 % (ref 39.0–52.0)
Hemoglobin: 13.8 g/dL (ref 13.0–17.0)
Immature Granulocytes: 1 %
Lymphocytes Relative: 21 %
Lymphs Abs: 1.3 10*3/uL (ref 0.7–4.0)
MCH: 33 pg (ref 26.0–34.0)
MCHC: 34 g/dL (ref 30.0–36.0)
MCV: 97.1 fL (ref 80.0–100.0)
Monocytes Absolute: 0.8 10*3/uL (ref 0.1–1.0)
Monocytes Relative: 13 %
Neutro Abs: 3.8 10*3/uL (ref 1.7–7.7)
Neutrophils Relative %: 61 %
Platelets: 235 10*3/uL (ref 150–400)
RBC: 4.18 MIL/uL — ABNORMAL LOW (ref 4.22–5.81)
RDW: 12.2 % (ref 11.5–15.5)
WBC: 6.2 10*3/uL (ref 4.0–10.5)
nRBC: 0 % (ref 0.0–0.2)

## 2021-12-07 LAB — COMPREHENSIVE METABOLIC PANEL
ALT: 34 U/L (ref 0–44)
AST: 35 U/L (ref 15–41)
Albumin: 4.1 g/dL (ref 3.5–5.0)
Alkaline Phosphatase: 51 U/L (ref 38–126)
Anion gap: 8 (ref 5–15)
BUN: 22 mg/dL (ref 8–23)
CO2: 27 mmol/L (ref 22–32)
Calcium: 8.8 mg/dL — ABNORMAL LOW (ref 8.9–10.3)
Chloride: 99 mmol/L (ref 98–111)
Creatinine, Ser: 0.93 mg/dL (ref 0.61–1.24)
GFR, Estimated: >60 mL/min (ref >60)
Glucose, Bld: 94 mg/dL (ref 70–99)
Potassium: 4.1 mmol/L (ref 3.5–5.1)
Sodium: 134 mmol/L — ABNORMAL LOW (ref 135–145)
Total Bilirubin: 1 mg/dL (ref 0.3–1.2)
Total Protein: 6.3 g/dL — ABNORMAL LOW (ref 6.5–8.1)

## 2021-12-07 LAB — CBG MONITORING, ED: Glucose-Capillary: 82 mg/dL (ref 70–99)

## 2021-12-07 LAB — TROPONIN I (HIGH SENSITIVITY): Troponin I (High Sensitivity): 4 ng/L (ref <18)

## 2021-12-07 MED ORDER — AMLODIPINE BESYLATE 5 MG PO TABS
5.0000 mg | ORAL_TABLET | Freq: Once | ORAL | Status: AC
  Administered 2021-12-07: 5 mg via ORAL
  Filled 2021-12-07: qty 1

## 2021-12-07 MED ORDER — IRBESARTAN 75 MG PO TABS
75.0000 mg | ORAL_TABLET | Freq: Every day | ORAL | Status: DC
  Administered 2021-12-07: 75 mg via ORAL
  Filled 2021-12-07: qty 1

## 2021-12-07 MED ORDER — HYDRALAZINE HCL 25 MG PO TABS
25.0000 mg | ORAL_TABLET | Freq: Once | ORAL | Status: AC
  Administered 2021-12-07: 25 mg via ORAL
  Filled 2021-12-07: qty 1

## 2021-12-07 MED ORDER — HYDRALAZINE HCL 25 MG PO TABS: 25.0000 mg | ORAL_TABLET | Freq: Three times a day (TID) | ORAL | 0 refills | Status: DC | PRN

## 2021-12-07 MED ORDER — METOPROLOL SUCCINATE ER 50 MG PO TB24
25.0000 mg | ORAL_TABLET | Freq: Every day | ORAL | Status: DC
  Administered 2021-12-07: 25 mg via ORAL
  Filled 2021-12-07: qty 1

## 2021-12-07 NOTE — ED Notes (Signed)
Urine sent to main lab.

## 2021-12-07 NOTE — ED Triage Notes (Signed)
Pt states that he was checking his BP routinely today and noticed several elevated readings. Pt had numerous readings from earlier today with SBP reading 150-220. Pt states that he was recently seen at [facility] ED and diagnosed with Afib RVR. Since then he has been having intermittent palpitations, and some noted today. Denies CP, SOB. Last BP medications last night.

## 2021-12-07 NOTE — ED Provider Notes (Signed)
[facility] DEPT Provider Note   CSN: [id number] Arrival date & time: 12/07/21  1811     History  Chief Complaint  Patient presents with   Hypertension   Palpitations    Michael Bates is a 81 y.o. male present emerged department concern for high blood pressure.  The patient was diagnosed with new onset atrial fibrillation in the hospital earlier this month, started on Eliquis, as well as metoprolol 25 mg extended release at night, amlodipine, and Avapro.  He has been taking of his medications.  He noted that today during his walk he began to feel palpitations, checked his blood pressure and it was over 761 systolic, which is quite elevated for him.  He has never had hypertension before his admission for A-fib.  He denies any dietary changes.  He called his PCP advised to come to the ED.  He denies any headache or visual deficits or changes.  HPI     Home Medications Prior to Admission medications   Medication Sig Start Date End Date Taking? Authorizing Provider  hydrALAZINE (APRESOLINE) 25 MG tablet Take 1 tablet (25 mg total) by mouth 3 (three) times daily as needed for up to 30 doses. 12/07/21  Yes Abbegail Matuska, Carola Rhine, MD  acetaminophen (TYLENOL) 500 MG tablet Take 500 mg by mouth every 6 (six) hours as needed for moderate pain or headache.    [provider]  albuterol (PROVENTIL HFA;VENTOLIN HFA) 108 (90 BASE) MCG/ACT inhaler Inhale 2 puffs into the lungs See admin instructions. Inhale 2 puffs every morning, may inhale 2 puffs every 6 hours as needed for shortness of breath    [provider]  alendronate (FOSAMAX) 70 MG tablet Take 70 mg by mouth once a week. Take on Saturdays 10/23/21   [provider]  amLODipine (NORVASC) 2.5 MG tablet Take 2.5 mg by mouth every evening. 09/20/21   [provider]  apixaban (ELIQUIS) 5 MG TABS tablet Take 1 tablet (5 mg total) by mouth 2 (two) times daily. 11/28/21   Little Ishikawa, MD  Bioflavonoid Products (ESTER C PO) Take 500 mg by mouth in the morning and at bedtime.    [provider]  buPROPion (WELLBUTRIN XL) 300 MG 24 hr tablet Take 300 mg by mouth daily. 11/30/15   [provider]  Cholecalciferol (VITAMIN D3) 5000 units CAPS Take 5,000 Units by mouth daily.    [provider]  CINNAMON PO Take 2 tablets by mouth every evening.    [provider]  Coenzyme Q10 300 MG CAPS Take 300 mg by mouth daily.    [provider]  Collagen-Boron-Hyaluronic Acid ([employer] JOINT HEALTH TRIPLE ACTION PO) Take 1 tablet by mouth daily.    [provider]  irbesartan (AVAPRO) 75 MG tablet Take 75 mg by mouth at bedtime.    [provider]  Ketotifen Fumarate (ALAWAY OP) Place 1 drop into both eyes daily as needed (allergies).    [provider]  levothyroxine (SYNTHROID, LEVOTHROID) 200 MCG tablet Take 200 mcg by mouth every morning. 12/30/15   [provider]  methylphenidate 18 MG PO CR tablet Take 18 mg by mouth daily. 12/29/15   [provider]  metoprolol succinate (TOPROL XL) 25 MG 24 hr tablet Take 1 tablet (25 mg total) by mouth daily. 11/28/21 11/28/22  Little Ishikawa, MD  mirabegron ER (MYRBETRIQ) 50 MG TB24 tablet Take 50 mg by mouth daily.    [provider]  Multiple Vitamins-Minerals (CENTRUM ULTRA MENS PO) Take 1 tablet by mouth daily.     [provider]  Multiple Vitamins-Minerals (ICAPS AREDS 2 PO) Take 1 tablet by mouth every evening.    [provider]  Omega-3 Fatty Acids (OMEGA 3 500 PO) Take 500 mg by mouth in the morning and at bedtime.    [provider]  QVAR REDIHALER 80 MCG/ACT inhaler Inhale 1 puff into the lungs 2 (two) times daily. 10/05/21   [provider]  rosuvastatin (CRESTOR) 5 MG tablet Take 5 mg by mouth daily.    [provider]  tadalafil (CIALIS) 5 MG tablet Take 5 mg by mouth daily.    [provider]  TURMERIC CURCUMIN PO Take 1,000 mg by mouth 2 (two) times daily.     [provider]      Allergies    Lisinopril and Tape    Review of Systems   Review of Systems  Physical Exam Updated Vital Signs BP (!) 149/98   Pulse 78   Temp 98.2 F (36.8 C) (Oral)   Resp 16   SpO2 100%  Physical Exam Constitutional:      General: He is not in acute distress. HENT:     Head: Normocephalic and atraumatic.  Eyes:     Conjunctiva/sclera: Conjunctivae normal.     Pupils: Pupils are equal, round, and reactive to light.  Cardiovascular:     Rate and Rhythm: Normal rate. Rhythm irregular.  Pulmonary:     Effort: Pulmonary effort is normal. No respiratory distress.  Abdominal:     General: There is no distension.     Tenderness: There is no abdominal tenderness.  Skin:    General: Skin is warm and dry.  Neurological:     General: No focal deficit present.     Mental Status: He is alert. Mental status is at baseline.  Psychiatric:        Mood and Affect: Mood normal.        Behavior: Behavior normal.     ED Results / Procedures / Treatments   Labs (all labs ordered are listed, but only abnormal results are displayed) Labs Reviewed  COMPREHENSIVE METABOLIC PANEL - Abnormal; Notable for the following components:      Result Value   Sodium 134 (*)    Calcium 8.8 (*)    Total Protein 6.3 (*)    All other components within normal limits  CBC WITH DIFFERENTIAL/PLATELET - Abnormal; Notable for the following components:   RBC 4.18 (*)    All other components within normal limits  CBG MONITORING, ED  TROPONIN I (HIGH SENSITIVITY)    EKG EKG Interpretation  Date/Time:  Wednesday December 07 2021 18:21:35 EDT Ventricular Rate:  73 PR Interval:    QRS Duration: 88 QT Interval:  368 QTC Calculation: 406 R Axis:   37 Text Interpretation: Atrial flutter with predominant 4:1 AV block Minimal ST depression, diffuse leads Confirmed by Octaviano Glow [phone]) on  12/07/2021 9:55:39 PM  Radiology DG Chest 2 View  Result Date: 12/07/2021 CLINICAL DATA:  Hypertension and atrial fibrillation. EXAM: CHEST - 2 VIEW COMPARISON:  Chest 11/27/2021 FINDINGS: The heart size and mediastinal contours are within normal limits. Both lungs are clear. The visualized skeletal structures are unremarkable. IMPRESSION: No active cardiopulmonary disease. Electronically Signed   By: Franchot Gallo M.D.   On: 12/07/2021 19:37    Procedures Procedures    Medications Ordered in ED Medications  metoprolol succinate (  TOPROL-XL) 24 hr tablet 25 mg (25 mg Oral Given 12/07/21 2214)  irbesartan (AVAPRO) tablet 75 mg (75 mg Oral Given 12/07/21 2236)  hydrALAZINE (APRESOLINE) tablet 25 mg (25 mg Oral Given 12/07/21 2215)  amLODipine (NORVASC) tablet 5 mg (5 mg Oral Given 12/07/21 2214)    ED Course/ Medical Decision Making/ A&P Clinical Course as of 12/07/21 2328  Wed Dec 07, 2021  2307 Improvement of blood pressure noted.  Will discharge patient with hydralazine as rescue blood pressure medication at home. [MT]    Clinical Course User Index [MT] Voris Tigert, Carola Rhine, MD                           Medical Decision Making Risk Prescription drug management.   Patient presented to ED with concern for hypertension, asymptomatic.    The person with history of hypertension began after his A-fib diagnosis.  It is likely related to his cardiomyopathy.  Per his history, they plan to keep him on anticoagulation for a month before considering cardioversion versus ablation.  He is quite hypertensive on arrival.  He has not taken his evening medications were given here today, as well as additional hydralazine, which was offered as a rescue medication at home.  A referral was placed back to cardiology, he may benefit from evaluation in the hypertension clinic.  I do not see signs or symptoms of stroke to recommend MRI of the brain or CT of the brain.  Low suspicion for PRES.  EKG and  troponins are unremarkable, do not show acute ischemic findings.  Creatinine within normal limits.  I doubt hypertensive emergency.  I reviewed his prior medical records including hospitalization course for new onset A-fib.         Final Clinical Impression(s) / ED Diagnoses Final diagnoses:  Hypertension, unspecified type    Rx / DC Orders ED Discharge Orders          Ordered    hydrALAZINE (APRESOLINE) 25 MG tablet  3 times daily PRN        12/07/21 2309    Ambulatory referral to Cardiology       Comments: Hypertension clinic   12/07/21 2325              Wyvonnia Dusky, MD 12/07/21 2330

## 2021-12-07 NOTE — ED Notes (Addendum)
Save blue tube in main lab °

## 2021-12-07 NOTE — Discharge Instructions (Signed)
Please continue to recording keep track of your daily blood pressures at home.  If you have sudden worsening headache, chest pain or pressure, difficulty breathing, feel like passing out, call 911 and come back to the ER.  Continue taking your normal blood pressure medications as prescribed at home.  I gave you an additional medicine called hydralazine which you can use as a rescue medication.  You can take this medicine once every 8 hours, for blood pressure that is very elevated.  If your systolic or top blood pressure numbers over 200 mmhg, or your diastolic or bottom numbers over 100 mmhg, you can take this 25 mg of hydralazine tablet.  Please call your cardiologist, or your primary care doctor's office, or whomever is managing your blood pressure medications.

## 2021-12-07 NOTE — ED Provider Triage Note (Signed)
Emergency Medicine Provider Triage Evaluation Note  Michael Bates , a 81 y.o. male  was evaluated in triage.  Pt complains of worsening blood pressure and intermittent A-fib.  Was seen in the ED 1 to 2 weeks ago for A-fib.  States his BP is remained intermittently elevated was less than.  Denies shortness of breath, chest pain, vision changes.  Endorses palpitations.  On Eliquis.  Review of Systems  Positive:  Negative: See above  Physical Exam  BP (!) 180/110 (BP Location: Right Arm)   Pulse 78   Temp 98.3 F (36.8 C) (Oral)   Resp 18   SpO2 100%  Gen:   Awake, no distress   Resp:  Normal effort CTAB MSK:   Moves extremities without difficulty  Other:  Irregularly irregular without M/R/G.  Abdomen soft nontender.  Medical Decision Making  Medically screening exam initiated at 7:02 PM.  Appropriate orders placed.  Michael Bates was informed that the remainder of the evaluation will be completed by another provider, this initial triage assessment does not replace that evaluation, and the importance of remaining in the ED until their evaluation is complete.     Michael Bates, [place] 89/21/19 1905

## 2021-12-08 ENCOUNTER — Encounter (HOSPITAL_COMMUNITY): Payer: Self-pay

## 2021-12-12 ENCOUNTER — Ambulatory Visit: Payer: Medicare PPO | Admitting: Pharmacist Clinician (PhC)/ Clinical Pharmacy Specialist

## 2021-12-12 DIAGNOSIS — I10 Essential (primary) hypertension: Secondary | ICD-10-CM

## 2021-12-13 ENCOUNTER — Ambulatory Visit (HOSPITAL_COMMUNITY)
Admission: RE | Admit: 2021-12-13 | Discharge: 2021-12-13 | Disposition: A | Discharge status: Home / Self Care | Payer: Medicare PPO | Source: Ambulatory Visit | Attending: Cardiology | Admitting: Cardiology

## 2021-12-13 DIAGNOSIS — I10 Essential (primary) hypertension: Secondary | ICD-10-CM | POA: Diagnosis not present

## 2021-12-16 DIAGNOSIS — I480 Paroxysmal atrial fibrillation: Secondary | ICD-10-CM | POA: Diagnosis not present

## 2021-12-16 DIAGNOSIS — I10 Essential (primary) hypertension: Secondary | ICD-10-CM | POA: Diagnosis not present

## 2021-12-21 ENCOUNTER — Telehealth: Payer: Self-pay | Admitting: Physician Assistant

## 2021-12-21 NOTE — Telephone Encounter (Signed)
Paged by answering service.  Reported heart rate of mid 40s.  He does feel fatigued and tired with less energy. Blood pressure  120-140s/80s.  No recent episode of atrial fibrillation on Apple watch.  Recommended to hold Toprol-XL 12.5 mg daily.  He will call us with blood pressure reading and his symptoms in few days.

## 2021-12-27 ENCOUNTER — Ambulatory Visit (HOSPITAL_COMMUNITY)
Admission: RE | Admit: 2021-12-27 | Discharge: 2021-12-27 | Disposition: A | Discharge status: Home / Self Care | Payer: Medicare PPO | Source: Ambulatory Visit | Attending: Nurse Practitioner | Admitting: Nurse Practitioner

## 2021-12-27 ENCOUNTER — Encounter (HOSPITAL_COMMUNITY): Payer: Self-pay | Admitting: Nurse Practitioner

## 2021-12-27 VITALS — BP 146/64 | HR 69 | Ht 71.0 in | Wt 153.2 lb

## 2021-12-27 DIAGNOSIS — K219 Gastro-esophageal reflux disease without esophagitis: Secondary | ICD-10-CM | POA: Diagnosis not present

## 2021-12-27 DIAGNOSIS — Z7901 Long term (current) use of anticoagulants: Secondary | ICD-10-CM | POA: Insufficient documentation

## 2021-12-27 DIAGNOSIS — I517 Cardiomegaly: Secondary | ICD-10-CM | POA: Diagnosis not present

## 2021-12-27 DIAGNOSIS — I440 Atrioventricular block, first degree: Secondary | ICD-10-CM | POA: Diagnosis not present

## 2021-12-27 DIAGNOSIS — I4891 Unspecified atrial fibrillation: Secondary | ICD-10-CM | POA: Insufficient documentation

## 2021-12-27 DIAGNOSIS — I4892 Unspecified atrial flutter: Secondary | ICD-10-CM | POA: Insufficient documentation

## 2021-12-27 DIAGNOSIS — I10 Essential (primary) hypertension: Secondary | ICD-10-CM | POA: Diagnosis not present

## 2021-12-27 DIAGNOSIS — E785 Hyperlipidemia, unspecified: Secondary | ICD-10-CM | POA: Diagnosis not present

## 2021-12-27 DIAGNOSIS — D6869 Other thrombophilia: Secondary | ICD-10-CM | POA: Diagnosis not present

## 2021-12-27 NOTE — Progress Notes (Signed)
Primary Care Physician: Michael Manes, MD Referring Physician: ER f/u Cardiologist: Michael Bates is a 81 y.o. male with a h/o SVT episode in 2022, hours after receiving Covid vaccine,  HTN, aflutter/afib just dx in the ER 11/27/21. He was discharged in afib with BB/eliquis on board with a CHA2DS2VASc  score of 3. Echo in the hospital showed normal EF. Monitor at time of SVT in 2022 showed  minimum HR 45 bpm and maximum HR 176bpm with average HR 63 bpm.  Predominant underlying rhythm was sinus rhythm.  First-degree AV block was present.  116 supraventricular tachycardia runs occurred, the run with the fastest interval lasting 9 beats with a max rate of 176 bpm, the longest lasting 12.6 seconds with an average rate of 103 bpm.  Ventricular bigeminy and trigeminy were present  He was referred to the afib clinic after recent ER visit with aflutter/afib . Michael Bates saw Michael Bates on consult in the hospital and he is scheduled to see her in August. He is in rate controlled afib today. He showed me strips on his phone  that showed intermittent SR. He is not overly symptomatic with afib but has noticed some fatigue. He walks 4 miles a day, normal BMI, no tobacco. He drinks 1-2 glasses of wine nightly.  Sleep study in the past did not show any sleep apnea.  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   F/u in afib clinic 12/28/21. Since I last saw him, he had an ER visit for hypertensive  emergency as systolic BP at home over 435 systolic.  In the ER it was 149/98. He has not had any further elevation of BP like that. Since ER, BP has been 686-168'H systolic. Recent renal U/S without any  renal artery stenosis.He also had to stop his BB as his heart rate was in the 40's and he felt sluggish. He also reports that he appears to be in SR for the last several weeks and feels improved. He has  an apple watch to help monitor.    Today, he denies symptoms of palpitations, chest  pain, shortness of breath, orthopnea, PND, lower extremity edema, dizziness, presyncope, syncope, or neurologic sequela. The patient is tolerating medications without difficulties and is otherwise without complaint today.   Past Medical History:  Diagnosis Date   Asthma    Atrial fibrillation (HCC)    BPH (benign prostatic hyperplasia)    Cancer (HCC)    hx of skin cancer    Degenerative arthritis of cervical spine    c5-c6   Dysrhythmia    hx of pvcs in the past    Family history of adverse reaction to anesthesia    Father had idosyncratic narcotic  reaction   GERD (gastroesophageal reflux disease)    occ    Hyperlipidemia    Hypertension    Hypothyroidism    Osteopenia    Raynaud disease    Renal disease    stage 3, patient not aware of this diagnosis   Thyroid disease    Past Surgical History:  Procedure Laterality Date   COLONOSCOPY WITH PROPOFOL N/A 01/10/2016   Procedure: COLONOSCOPY WITH PROPOFOL;  Surgeon: Garlan Fair, MD;  Location: [facility] ENDOSCOPY;  Service: Endoscopy;  Laterality: N/A;   HEMORROIDECTOMY     INGUINAL HERNIA REPAIR Bilateral 12/01/2019   Procedure: LAPAROSCOPIC BILATERAL INGUINAL HERNIA REPAIR WITH MESH,UMBILICAL HERNIA REPAIR;  Surgeon: Kieth Brightly, Arta Bruce, MD;  Location: [facility] ORS;  Service: General;  Laterality: Bilateral;   PAROTIDECTOMY  2017   Dr. Redmond Bates   PROSTATECTOMY      Current Outpatient Medications  Medication Sig Dispense Refill   albuterol (PROVENTIL HFA;VENTOLIN HFA) 108 (90 BASE) MCG/ACT inhaler Inhale 2 puffs into the lungs See admin instructions. Inhale 2 puffs every morning, may inhale 2 puffs every 6 hours as needed for shortness of breath     alendronate (FOSAMAX) 70 MG tablet Take 70 mg by mouth once a week. Take on Saturdays     amLODipine (NORVASC) 2.5 MG tablet Take 2.5 mg by mouth every evening.     apixaban (ELIQUIS) 5 MG TABS tablet Take 1 tablet (5 mg total) by mouth 2 (two) times daily. 60 tablet 1   Bioflavonoid  Products (ESTER C PO) Take 500 mg by mouth in the morning and at bedtime.     buPROPion (WELLBUTRIN XL) 300 MG 24 hr tablet Take 300 mg by mouth daily.     Cholecalciferol (VITAMIN D3) 5000 units CAPS Take 5,000 Units by mouth daily.     CINNAMON PO Take 2 tablets by mouth every evening.     Coenzyme Q10 300 MG CAPS Take 300 mg by mouth daily.     Collagen-Boron-Hyaluronic Acid ([employer] JOINT HEALTH TRIPLE ACTION PO) Take 1 tablet by mouth daily.     hydrALAZINE (APRESOLINE) 25 MG tablet Take 25 mg by mouth as needed.     irbesartan (AVAPRO) 75 MG tablet Take 75 mg by mouth at bedtime.     Ketotifen Fumarate (ALAWAY OP) Place 1 drop into both eyes daily as needed (allergies).     levothyroxine (SYNTHROID, LEVOTHROID) 200 MCG tablet Take 200 mcg by mouth every morning.     methylphenidate 18 MG PO CR tablet Take 18 mg by mouth daily.     mirabegron ER (MYRBETRIQ) 50 MG TB24 tablet Take 50 mg by mouth daily.     Multiple Vitamins-Minerals (CENTRUM ULTRA MENS PO) Take 1 tablet by mouth daily.      Multiple Vitamins-Minerals (ICAPS AREDS 2 PO) Take 1 tablet by mouth every evening.     Omega-3 Fatty Acids (OMEGA 3 500 PO) Take 500 mg by mouth in the morning and at bedtime.     QVAR REDIHALER 80 MCG/ACT inhaler Inhale 1 puff into the lungs 2 (two) times daily.     rosuvastatin (CRESTOR) 5 MG tablet Take 5 mg by mouth daily.     tadalafil (CIALIS) 5 MG tablet Take 5 mg by mouth daily.     TURMERIC CURCUMIN PO Take 500 mg by mouth every morning.     No current facility-administered medications for this encounter.    Allergies  Allergen Reactions   Lisinopril Cough   Tape Other (See Comments)    RED IRRITATION IF LEFT ON LONG TIME    Social History   Socioeconomic History   Marital status: Married    Spouse name: Not on file   Number of children: 3   Years of education: 20   Highest education level: Not on file  Occupational History   Not on file  Tobacco Use   Smoking status: Former     Packs/day: 1.00    Years: 20.00    Total pack years: 20.00    Types: Cigarettes, Pipe    Quit date: 61    Years since quitting: 44.5   Smokeless tobacco: Never  Vaping Use   Vaping Use: Never used  Substance and Sexual Activity  Alcohol use: Yes    Alcohol/week: 7.0 standard drinks of alcohol    Types: 7 Glasses of wine per week   Drug use: No   Sexual activity: Yes  Other Topics Concern   Not on file  Social History Narrative   Not on file   Social Determinants of Health   Financial Resource Strain: Not on file  Food Insecurity: Not on file  Transportation Needs: No Transportation Needs (02/26/2019)   PRAPARE - Hydrologist (Medical): No    Lack of Transportation (Non-Medical): No  Physical Activity: Not on file  Stress: Not on file  Social Connections: Not on file  Intimate Partner Violence: Not At Risk (02/26/2019)   Humiliation, Afraid, Rape, and Kick questionnaire    Fear of Current or Ex-Partner: No    Emotionally Abused: No    Physically Abused: No    Sexually Abused: No    No family history on file.  ROS- All systems are reviewed and negative except as per the HPI above  Physical Exam: Vitals:   12/27/21 1332  BP: (!) 146/64  Pulse: 69  Weight: 69.5 kg  Height: '5\' 11"'$  (1.803 m)   Wt Readings from Last 3 Encounters:  12/27/21 69.5 kg  12/05/21 69.3 kg  11/28/19 70.5 kg    Labs: Lab Results  Component Value Date   NA 134 (L) 12/07/2021   K 4.1 12/07/2021   CL 99 12/07/2021   CO2 27 12/07/2021   GLUCOSE 94 12/07/2021   BUN 22 12/07/2021   CREATININE 0.93 12/07/2021   CALCIUM 8.8 (L) 12/07/2021   MG 2.2 11/27/2021   No results found for: "INR" No results found for: "CHOL", "HDL", "LDLCALC", "TRIG"   GEN- The patient is well appearing, alert and oriented x 3 today.   Head- normocephalic, atraumatic Eyes-  Sclera clear, conjunctiva pink Ears- hearing intact Oropharynx- clear Neck- supple, no JVP Lymph- no  cervical lymphadenopathy Lungs- Clear to ausculation bilaterally, normal work of breathing Heart- Regular rate and rhythm, no murmurs, rubs or gallops, PMI not laterally displaced GI- soft, NT, ND, + BS Extremities- no clubbing, cyanosis, or edema MS- no significant deformity or atrophy Skin- no rash or lesion Psych- euthymic mood, full affect Neuro- strength and sensation are intact  EKG- Vent. rate 69 BPM PR interval 202 ms QRS duration 84 ms QT/QTcB 388/415 ms P-R-T axes 99 8 62 Normal sinus rhythm Normal ECG When compared with ECG of 07-Dec-2021 18:21, PREVIOUS ECG IS PRESENT  Echo-Marian, Michael Bates  Procedures:  ECHO COMPLETE WO IMAGING ENHANCING AGENT  Height:  Not recorded  Weight:  Not recorded  Blood Pressure:  Not recorded   Accession Number:  [id number]  Date of Study:  11/28/21  Ordering Provider:  Margie Billet, NP  Clinical Indications:  None Listed     Interpreting Physicians  Performing Staff  Michael Mayo, MD Tech:  Michael Bates, RVT     ECHOCARDIOGRAM COMPLETE (Accession [id number]) (Order [id number]) Echocardiography Date: 11/28/2021 Department: [facility] Released By/Authorizing: Michael Billet, NP (auto-released)    ECHOCARDIOGRAM COMPLETE Order #: [id number] Accession #: [id number]    Patient Info  Patient name: Michael Bates  MRN: [id number]  Age: 81 y.o.  Sex: male    MyChart Results Release  MyChart Status: Active  Results Release       Vitals  BP Height Weight BSA (Calculated - sq m)  Order-Level Documents:  Scan on 11/28/2021 2:39 PM by Default, Provider, MD        Study Result      ECHOCARDIOGRAM REPORT         Patient Name:   Michael Bates Date of Exam: 11/28/2021  Medical Rec #:  [id number]         Height:       71.0 in  Accession #:    [id number]        Weight:       155.4 lb  Date of Birth:  June 27, 1940          BSA:          1.894 m  Patient Age:    21  years          BP:           132/83 mmHg  Patient Gender: M                 HR:           77 bpm.  Exam Location:  Inpatient   Procedure: 2D Echo, Cardiac Doppler and Color Doppler   Indications:    I48.91* Unspeicified atrial fibrillation     History:        Patient has no prior history of Echocardiogram  examinations.                  Risk Factors:Hypertension, Dyslipidemia and GERD.     Sonographer:    Michael Bates RDCS  Referring Phys: [id number] Michael Bates   IMPRESSIONS     1. Left ventricular ejection fraction, by estimation, is 60 to 65%. The  left ventricle has normal function. The left ventricle has no regional  wall motion abnormalities. There is mild left ventricular hypertrophy.  Left ventricular diastolic parameters  are indeterminate.   2. Right ventricular systolic function is normal. The right ventricular  size is normal. There is normal pulmonary artery systolic pressure.   3. The mitral valve is grossly normal. Trivial mitral valve  regurgitation.   4. The aortic valve is tricuspid. Aortic valve regurgitation is not  visualized.   5. Aortic no significant ascending aneurysm.   6. The inferior vena cava is normal in size with greater than 50%  respiratory variability, suggesting right atrial pressure of 3 mmHg    Assessment and Plan:  1. Afib/flutter  He is in SR for several weeks and feels improved Wait and watch for now  He is off BB for bradycardia   2. CHA2DS2VASc  score of 3 Continue eliquis 5 mg bid  3. HTN Michael Bates will watch for now One spike for which he went to the ER  Now running 078'M to 754'G systolic at home Recent renal ultrasound did not show renal artery stenosis  F/u with Michael Bates 8/28  Michael Bates. Michael Bates, [facility] [address] [place], [place] [address] [phone]

## 2022-01-03 ENCOUNTER — Encounter (INDEPENDENT_AMBULATORY_CARE_PROVIDER_SITE_OTHER): Payer: Self-pay | Admitting: Ophthalmology

## 2022-01-03 ENCOUNTER — Ambulatory Visit (INDEPENDENT_AMBULATORY_CARE_PROVIDER_SITE_OTHER): Payer: Medicare PPO | Admitting: Ophthalmology

## 2022-01-03 DIAGNOSIS — H353132 Nonexudative age-related macular degeneration, bilateral, intermediate dry stage: Secondary | ICD-10-CM

## 2022-01-03 DIAGNOSIS — R0683 Snoring: Secondary | ICD-10-CM

## 2022-01-03 DIAGNOSIS — Z961 Presence of intraocular lens: Secondary | ICD-10-CM

## 2022-01-03 NOTE — Assessment & Plan Note (Signed)
Intermediate ARMD OU.  Patient continues on AREDS 2 formulation vitamin supplementation.  Patient encouraged to avoid any excess or supplemental vitamin A beyond that and normally found in a multivitamin.

## 2022-01-03 NOTE — Assessment & Plan Note (Signed)
OU, stable

## 2022-01-03 NOTE — Progress Notes (Signed)
01/03/2022     CHIEF COMPLAINT Patient presents for  Chief Complaint  Patient presents with   Macular Degeneration      HISTORY OF PRESENT ILLNESS: Michael Bates is a 81 y.o. male who presents to the clinic today for:   HPI   1 YR FU OU OCT. Pt stated, "My vision hasn't gotten any better but not any worse. I'm having a hard time distinguishing figure and ground. I also find myself enlarging my screen to see better." Pt had AFIB and was prescribed ELIQUIS-'5mG'$  bid   Last edited by Hurman Horn, MD on 01/03/2022  1:23 PM.      Referring physician: Clent Jacks, MD [address] [place],  [place] [address]  HISTORICAL INFORMATION:   Selected notes from the MEDICAL RECORD NUMBER       CURRENT MEDICATIONS: Current Outpatient Medications (Ophthalmic Drugs)  Medication Sig   Ketotifen Fumarate (ALAWAY OP) Place 1 drop into both eyes daily as needed (allergies).   No current facility-administered medications for this visit. (Ophthalmic Drugs)   Current Outpatient Medications (Other)  Medication Sig   albuterol (PROVENTIL HFA;VENTOLIN HFA) 108 (90 BASE) MCG/ACT inhaler Inhale 2 puffs into the lungs See admin instructions. Inhale 2 puffs every morning, may inhale 2 puffs every 6 hours as needed for shortness of breath   alendronate (FOSAMAX) 70 MG tablet Take 70 mg by mouth once a week. Take on Saturdays   amLODipine (NORVASC) 2.5 MG tablet Take 2.5 mg by mouth every evening.   apixaban (ELIQUIS) 5 MG TABS tablet Take 1 tablet (5 mg total) by mouth 2 (two) times daily.   Bioflavonoid Products (ESTER C PO) Take 500 mg by mouth in the morning and at bedtime.   buPROPion (WELLBUTRIN XL) 300 MG 24 hr tablet Take 300 mg by mouth daily.   Cholecalciferol (VITAMIN D3) 5000 units CAPS Take 5,000 Units by mouth daily.   CINNAMON PO Take 2 tablets by mouth every evening.   Coenzyme Q10 300 MG CAPS Take 300 mg by mouth daily.   Collagen-Boron-Hyaluronic Acid ([employer] JOINT HEALTH  TRIPLE ACTION PO) Take 1 tablet by mouth daily.   hydrALAZINE (APRESOLINE) 25 MG tablet Take 25 mg by mouth as needed.   irbesartan (AVAPRO) 75 MG tablet Take 75 mg by mouth at bedtime.   levothyroxine (SYNTHROID, LEVOTHROID) 200 MCG tablet Take 200 mcg by mouth every morning.   methylphenidate 18 MG PO CR tablet Take 18 mg by mouth daily.   mirabegron ER (MYRBETRIQ) 50 MG TB24 tablet Take 50 mg by mouth daily.   Multiple Vitamins-Minerals (CENTRUM ULTRA MENS PO) Take 1 tablet by mouth daily.    Multiple Vitamins-Minerals (ICAPS AREDS 2 PO) Take 1 tablet by mouth every evening.   Omega-3 Fatty Acids (OMEGA 3 500 PO) Take 500 mg by mouth in the morning and at bedtime.   QVAR REDIHALER 80 MCG/ACT inhaler Inhale 1 puff into the lungs 2 (two) times daily.   rosuvastatin (CRESTOR) 5 MG tablet Take 5 mg by mouth daily.   tadalafil (CIALIS) 5 MG tablet Take 5 mg by mouth daily.   TURMERIC CURCUMIN PO Take 500 mg by mouth every morning.   No current facility-administered medications for this visit. (Other)      REVIEW OF SYSTEMS: ROS   Negative for: Constitutional, Gastrointestinal, Neurological, Skin, Genitourinary, Musculoskeletal, HENT, Endocrine, Cardiovascular, Eyes, Respiratory, Psychiatric, Allergic/Imm, Heme/Lymph Last edited by Silvestre Moment on 01/03/2022  1:00 PM.  ALLERGIES Allergies  Allergen Reactions   Lisinopril Cough   Tape Other (See Comments)    RED IRRITATION IF LEFT ON LONG TIME    PAST MEDICAL HISTORY Past Medical History:  Diagnosis Date   Asthma    Atrial fibrillation (HCC)    BPH (benign prostatic hyperplasia)    Cancer (HCC)    hx of skin cancer    Degenerative arthritis of cervical spine    c5-c6   Dysrhythmia    hx of pvcs in the past    Family history of adverse reaction to anesthesia    Father had idosyncratic narcotic  reaction   GERD (gastroesophageal reflux disease)    occ    Hyperlipidemia    Hypertension    Hypothyroidism    Osteopenia     Raynaud disease    Renal disease    stage 3, patient not aware of this diagnosis   Thyroid disease    Past Surgical History:  Procedure Laterality Date   COLONOSCOPY WITH PROPOFOL N/A 01/10/2016   Procedure: COLONOSCOPY WITH PROPOFOL;  Surgeon: Garlan Fair, MD;  Location: [facility];  Service: Endoscopy;  Laterality: N/A;   HEMORROIDECTOMY     INGUINAL HERNIA REPAIR Bilateral 12/01/2019   Procedure: LAPAROSCOPIC BILATERAL INGUINAL HERNIA REPAIR WITH MESH,UMBILICAL HERNIA REPAIR;  Surgeon: Kieth Brightly, Arta Bruce, MD;  Location: [facility];  Service: General;  Laterality: Bilateral;   PAROTIDECTOMY  2017   Dr. Redmond Baseman   PROSTATECTOMY      FAMILY HISTORY History reviewed. No pertinent family history.  SOCIAL HISTORY Social History   Tobacco Use   Smoking status: Former    Packs/day: 1.00    Years: 20.00    Total pack years: 20.00    Types: Cigarettes, Pipe    Quit date: 1979    Years since quitting: 44.5   Smokeless tobacco: Never  Vaping Use   Vaping Use: Never used  Substance Use Topics   Alcohol use: Yes    Alcohol/week: 7.0 standard drinks of alcohol    Types: 7 Glasses of wine per week   Drug use: No         OPHTHALMIC EXAM:  Base Eye Exam     Visual Acuity (ETDRS)       Right Left   Dist cc 20/25 +2 20/20    Correction: Glasses         Tonometry (Tonopen, 1:05 PM)       Right Left   Pressure 19 19         Pupils       Pupils Dark Light Shape React APD   Right PERRL 4 3 Round Brisk None   Left PERRL 4 3 Round Brisk None         Visual Fields       Left Right    Full Full         Extraocular Movement       Right Left    Full Full         Neuro/Psych     Oriented x3: Yes         Dilation     Both eyes: 1.0% Mydriacyl, 2.5% Phenylephrine @ 1:05 PM           Slit Lamp and Fundus Exam     External Exam       Right Left   External Normal Normal         Slit Lamp Exam  Right Left   Lids/Lashes Normal  Normal   Conjunctiva/Sclera White and quiet White and quiet   Cornea Clear Clear   Anterior Chamber Deep and quiet Deep and quiet   Iris Round and reactive Round and reactive   Lens Centered posterior chamber intraocular lens Centered posterior chamber intraocular lens   Anterior Vitreous Normal Normal         Fundus Exam       Right Left   Posterior Vitreous Normal Normal   Disc Normal Normal   C/D Ratio 0.05 0.05   Macula Intermediate age related macular degeneration, Soft drusen Intermediate age related macular degeneration, Soft drusen   Vessels Normal Normal   Periphery Normal Normal            IMAGING AND PROCEDURES  Imaging and Procedures for 01/03/22  OCT, Retina - OU - Both Eyes       Right Eye Quality was good. Scan locations included subfoveal. Central Foveal Thickness: 327. Progression has no prior data. Findings include no SRF, abnormal foveal contour, retinal drusen .   Left Eye Quality was good. Scan locations included subfoveal. Central Foveal Thickness: 327. Progression has no prior data. Findings include no SRF, abnormal foveal contour, retinal drusen .              ASSESSMENT/PLAN:  Intermediate stage nonexudative age-related macular degeneration of both eyes Intermediate ARMD OU.  Patient continues on AREDS 2 formulation vitamin supplementation.  Patient encouraged to avoid any excess or supplemental vitamin A beyond that and normally found in a multivitamin.  Pseudophakia, both eyes OU, stable  Snores Review of systems in the past verified to not have sleep apnea by home sleep study      ICD-10-CM   1. Intermediate stage nonexudative age-related macular degeneration of both eyes  H35.3132 OCT, Retina - OU - Both Eyes    2. Pseudophakia, both eyes  Z96.1     3. Snores  R06.83       1.  OU no interval change.  No signs of high risk formation CNVM nor geographic atrophy.  2.  Discussed the use of medications soon for dry macular  degeneration which he would not qualify for.  No   evidence of geographic atrophy at this time  3.  Ophthalmic Meds Ordered this visit:  No orders of the defined types were placed in this encounter.      Return in about 1 year (around 01/04/2023) for DILATE OU, COLOR FP, OCT.  There are no Patient Instructions on file for this visit.   Explained the diagnoses, plan, and follow up with the patient and they expressed understanding.  Patient expressed understanding of the importance of proper follow up care.   Clent Demark Torey Regan M.D. Diseases & Surgery of the Retina and Vitreous [facility] [place] 01/03/22     Abbreviations: M myopia (nearsighted); A astigmatism; H hyperopia (farsighted); P presbyopia; Mrx spectacle prescription;  CTL contact lenses; OD right eye; OS left eye; OU both eyes  XT exotropia; ET esotropia; PEK punctate epithelial keratitis; PEE punctate epithelial erosions; DES dry eye syndrome; MGD meibomian gland dysfunction; ATs artificial tears; PFAT's preservative free artificial tears; [place] nuclear sclerotic cataract; PSC posterior subcapsular cataract; ERM epi-retinal membrane; PVD posterior vitreous detachment; RD retinal detachment; DM diabetes mellitus; DR diabetic retinopathy; NPDR non-proliferative diabetic retinopathy; PDR proliferative diabetic retinopathy; CSME clinically significant macular edema; DME diabetic macular edema; dbh dot blot hemorrhages; CWS cotton wool spot; POAG primary open  angle glaucoma; C/D cup-to-disc ratio; HVF humphrey visual field; GVF goldmann visual field; OCT optical coherence tomography; IOP intraocular pressure; BRVO Branch retinal vein occlusion; CRVO central retinal vein occlusion; CRAO central retinal artery occlusion; BRAO branch retinal artery occlusion; RT retinal tear; SB scleral buckle; PPV pars plana vitrectomy; VH Vitreous hemorrhage; PRP panretinal laser photocoagulation; IVK intravitreal kenalog; VMT vitreomacular  traction; MH Macular hole;  NVD neovascularization of the disc; NVE neovascularization elsewhere; AREDS age related eye disease study; ARMD age related macular degeneration; POAG primary open angle glaucoma; EBMD epithelial/anterior basement membrane dystrophy; ACIOL anterior chamber intraocular lens; IOL intraocular lens; PCIOL posterior chamber intraocular lens; Phaco/IOL phacoemulsification with intraocular lens placement; [place] photorefractive keratectomy; LASIK laser assisted in situ keratomileusis; HTN hypertension; DM diabetes mellitus; COPD chronic obstructive pulmonary disease

## 2022-01-03 NOTE — Assessment & Plan Note (Signed)
Review of systems in the past verified to not have sleep apnea by home sleep study

## 2022-01-17 DIAGNOSIS — L814 Other melanin hyperpigmentation: Secondary | ICD-10-CM | POA: Diagnosis not present

## 2022-01-17 DIAGNOSIS — L538 Other specified erythematous conditions: Secondary | ICD-10-CM | POA: Diagnosis not present

## 2022-01-17 DIAGNOSIS — L820 Inflamed seborrheic keratosis: Secondary | ICD-10-CM | POA: Diagnosis not present

## 2022-01-17 DIAGNOSIS — C44622 Squamous cell carcinoma of skin of right upper limb, including shoulder: Secondary | ICD-10-CM | POA: Diagnosis not present

## 2022-01-17 DIAGNOSIS — Z85828 Personal history of other malignant neoplasm of skin: Secondary | ICD-10-CM | POA: Diagnosis not present

## 2022-01-17 DIAGNOSIS — Z08 Encounter for follow-up examination after completed treatment for malignant neoplasm: Secondary | ICD-10-CM | POA: Diagnosis not present

## 2022-01-17 DIAGNOSIS — L298 Other pruritus: Secondary | ICD-10-CM | POA: Diagnosis not present

## 2022-01-17 DIAGNOSIS — R208 Other disturbances of skin sensation: Secondary | ICD-10-CM | POA: Diagnosis not present

## 2022-01-17 DIAGNOSIS — Z789 Other specified health status: Secondary | ICD-10-CM | POA: Diagnosis not present

## 2022-01-17 DIAGNOSIS — D485 Neoplasm of uncertain behavior of skin: Secondary | ICD-10-CM | POA: Diagnosis not present

## 2022-01-17 DIAGNOSIS — C44519 Basal cell carcinoma of skin of other part of trunk: Secondary | ICD-10-CM | POA: Diagnosis not present

## 2022-02-10 DIAGNOSIS — C4441 Basal cell carcinoma of skin of scalp and neck: Secondary | ICD-10-CM | POA: Diagnosis not present

## 2022-02-10 DIAGNOSIS — L989 Disorder of the skin and subcutaneous tissue, unspecified: Secondary | ICD-10-CM | POA: Diagnosis not present

## 2022-02-10 DIAGNOSIS — L905 Scar conditions and fibrosis of skin: Secondary | ICD-10-CM | POA: Diagnosis not present

## 2022-02-10 DIAGNOSIS — D485 Neoplasm of uncertain behavior of skin: Secondary | ICD-10-CM | POA: Diagnosis not present

## 2022-02-13 ENCOUNTER — Encounter: Payer: Self-pay | Admitting: Internal Medicine

## 2022-02-13 ENCOUNTER — Ambulatory Visit: Payer: Medicare PPO | Attending: Internal Medicine | Admitting: Internal Medicine

## 2022-02-13 VITALS — BP 130/66 | HR 62 | Ht 71.0 in | Wt 153.4 lb

## 2022-02-13 DIAGNOSIS — E7800 Pure hypercholesterolemia, unspecified: Secondary | ICD-10-CM | POA: Diagnosis not present

## 2022-02-13 DIAGNOSIS — I10 Essential (primary) hypertension: Secondary | ICD-10-CM | POA: Diagnosis not present

## 2022-02-13 DIAGNOSIS — I4891 Unspecified atrial fibrillation: Secondary | ICD-10-CM

## 2022-02-13 DIAGNOSIS — D6869 Other thrombophilia: Secondary | ICD-10-CM | POA: Diagnosis not present

## 2022-02-13 MED ORDER — DILTIAZEM HCL 30 MG PO TABS: 30.0000 mg | ORAL_TABLET | Freq: Three times a day (TID) | ORAL | 3 refills | Status: AC | PRN

## 2022-02-13 NOTE — Patient Instructions (Signed)
Medication Instructions:  START: DILTIAZEM '30mg'$  UP TO THREE TIMES DAILY ONLY AS NEEDED FOR PALPITATIONS/ATRIAL FIB *If you need a refill on your cardiac medications before your next appointment, please call your pharmacy*  Lab Work: None Ordered At This Time.  If you have labs (blood work) drawn today and your tests are completely normal, you will receive your results only by: [place] (if you have MyChart) OR A paper copy in the mail If you have any lab test that is abnormal or we need to change your treatment, we will call you to review the results.  Testing/Procedures: None Ordered At This Time.   Follow-Up: At [facility], you and your health needs are our priority.  As part of our continuing mission to provide you with exceptional heart care, we have created designated Provider Care Teams.  These Care Teams include your primary Cardiologist (physician) and Advanced Practice Providers (APPs -  Physician Assistants and Nurse Practitioners) who all work together to provide you with the care you need, when you need it.  Your next appointment:   6 month(s)  The format for your next appointment:   In Person  Provider:   Elouise Munroe, MD

## 2022-02-13 NOTE — Progress Notes (Signed)
Cardiology Office Note:    Date:  02/13/2022   ID:  Michael Bates, DOB December 16, 1940, MRN [id number]  PCP:  Lajean Manes, MD  Cardiologist:  None  Electrophysiologist:  None   Referring MD: Lajean Manes, MD   Chief Complaint/Reason for Referral: CC:Hospital Follow-Up  History of Present Illness:    Michael Bates is a 81 y.o. male with a history of SVT, HTN, afib/aflutter, here for hospital follow-up.  On 11/27/21, he went to the ER with SVT. On 12/05/21, he was seen in clinic. He showed strips on his phone of intermittent SR, and he showed rate controlled afib at the time. On 12/07/21, he went to the ED for HTN.  Today:  Seen by CV[facility] HTN clinic as well as Afib clinic since hospital dismissal. Went to ED with HTN 10 days after dismissal with home BP reading >200/100. BP at goal at CVRR visit, metoprolol decreased due to fatigue. Renal ultrasound unremarkable.  His blood pressure has been better recently, but there was one day where his blood pressure was 150 mmHg. He is unsure what could have caused this.   Seen by Afib clinic - was in [place], watchful waiting recommended. At that point, BB had been stopped for bradycardia. Continues on eliquis 5 mg BID, no bleeding. CHADS2VASC of 3 (age, HTN).  He has a small amount of swelling in his legs. He does not feel that salt intake affects the level of swelling.   The patient denies chest pain, chest pressure, dyspnea at rest or with exertion, PND, or orthopnea,. Denies cough, fever, chills. Denies nausea, vomiting. Denies syncope or presyncope. Denies dizziness or lightheadedness. Denies snoring.   Past Medical History:  Diagnosis Date   Asthma    Atrial fibrillation (HCC)    BPH (benign prostatic hyperplasia)    Cancer (HCC)    hx of skin cancer    Degenerative arthritis of cervical spine    c5-c6   Dysrhythmia    hx of pvcs in the past    Family history of adverse reaction to anesthesia    Father had idosyncratic narcotic   reaction   GERD (gastroesophageal reflux disease)    occ    Hyperlipidemia    Hypertension    Hypothyroidism    Osteopenia    Raynaud disease    Renal disease    stage 3, patient not aware of this diagnosis   Thyroid disease     Past Surgical History:  Procedure Laterality Date   COLONOSCOPY WITH PROPOFOL N/A 01/10/2016   Procedure: COLONOSCOPY WITH PROPOFOL;  Surgeon: Garlan Fair, MD;  Location: [facility];  Service: Endoscopy;  Laterality: N/A;   HEMORROIDECTOMY     INGUINAL HERNIA REPAIR Bilateral 12/01/2019   Procedure: LAPAROSCOPIC BILATERAL INGUINAL HERNIA REPAIR WITH MESH,UMBILICAL HERNIA REPAIR;  Surgeon: Kinsinger, Arta Bruce, MD;  Location: [facility];  Service: General;  Laterality: Bilateral;   PAROTIDECTOMY  2017   Dr. Redmond Baseman   PROSTATECTOMY      Current Medications: Current Meds  Medication Sig   albuterol (PROVENTIL HFA;VENTOLIN HFA) 108 (90 BASE) MCG/ACT inhaler Inhale 2 puffs into the lungs See admin instructions. Inhale 2 puffs every morning, may inhale 2 puffs every 6 hours as needed for shortness of breath   alendronate (FOSAMAX) 70 MG tablet Take 70 mg by mouth once a week. Take on Saturdays   amLODipine (NORVASC) 2.5 MG tablet Take 2.5 mg by mouth every evening.   apixaban (ELIQUIS) 5 MG TABS tablet Take 1  tablet (5 mg total) by mouth 2 (two) times daily.   Bioflavonoid Products (ESTER C PO) Take 500 mg by mouth in the morning and at bedtime.   buPROPion (WELLBUTRIN XL) 300 MG 24 hr tablet Take 300 mg by mouth daily.   Cholecalciferol (VITAMIN D3) 5000 units CAPS Take 5,000 Units by mouth daily.   CINNAMON PO Take 2 tablets by mouth every evening.   Coenzyme Q10 300 MG CAPS Take 300 mg by mouth daily.   Collagen-Boron-Hyaluronic Acid ([employer] JOINT HEALTH TRIPLE ACTION PO) Take 1 tablet by mouth daily.   diltiazem (CARDIZEM) 30 MG tablet Take 1 tablet (30 mg total) by mouth 3 (three) times daily as needed (for palpitations or atrial fib).   irbesartan (AVAPRO)  75 MG tablet Take 75 mg by mouth at bedtime.   levothyroxine (SYNTHROID, LEVOTHROID) 200 MCG tablet Take 200 mcg by mouth every morning.   methylphenidate 18 MG PO CR tablet Take 18 mg by mouth daily.   mirabegron ER (MYRBETRIQ) 50 MG TB24 tablet Take 50 mg by mouth daily.   Multiple Vitamins-Minerals (CENTRUM ULTRA MENS PO) Take 1 tablet by mouth daily.    Multiple Vitamins-Minerals (ICAPS AREDS 2 PO) Take 1 tablet by mouth every evening.   Omega-3 Fatty Acids (OMEGA 3 500 PO) Take 500 mg by mouth in the morning and at bedtime.   QVAR REDIHALER 80 MCG/ACT inhaler Inhale 1 puff into the lungs 2 (two) times daily.   rosuvastatin (CRESTOR) 5 MG tablet Take 5 mg by mouth daily.   tadalafil (CIALIS) 5 MG tablet Take 5 mg by mouth daily.   TURMERIC CURCUMIN PO Take 500 mg by mouth every morning.     Allergies:   Lisinopril and Tape   Social History   Tobacco Use   Smoking status: Former    Packs/day: 1.00    Years: 20.00    Total pack years: 20.00    Types: Cigarettes, Pipe    Quit date: 1979    Years since quitting: 44.6   Smokeless tobacco: Never  Vaping Use   Vaping Use: Never used  Substance Use Topics   Alcohol use: Yes    Alcohol/week: 7.0 standard drinks of alcohol    Types: 7 Glasses of wine per week   Drug use: No     Family History: The patient's family history is not on file.  ROS:   Please see the history of present illness.     (+)Afib (+)Swelling in Legs  All other systems reviewed and are negative.  EKGs/Labs/Other Studies Reviewed:    The following studies were reviewed today:  DG Chest 2 View 12/07/21:  FINDINGS: The heart size and mediastinal contours are within normal limits. Both lungs are clear. The visualized skeletal structures are unremarkable.   IMPRESSION: No active cardiopulmonary disease.  Echo 11/28/21:  IMPRESSIONS    1. Left ventricular ejection fraction, by estimation, is 60 to 65%. The  left ventricle has normal function. The  left ventricle has no regional  wall motion abnormalities. There is mild left ventricular hypertrophy.  Left ventricular diastolic parameters  are indeterminate.   2. Right ventricular systolic function is normal. The right ventricular  size is normal. There is normal pulmonary artery systolic pressure.   3. The mitral valve is grossly normal. Trivial mitral valve  regurgitation.   4. The aortic valve is tricuspid. Aortic valve regurgitation is not  visualized.   5. Aortic no significant ascending aneurysm.   6. The inferior vena  cava is normal in size with greater than 50%  respiratory variability, suggesting right atrial pressure of 3 mmHg.   Comparison(s): No prior Echocardiogram.   EKG:  02/13/22: Normal Sinus rhythm.   Imaging studies that I have independently reviewed today: n/a  Recent Labs: 11/27/2021: Magnesium 2.2; TSH 2.962 12/07/2021: ALT 34; BUN 22; Creatinine, Ser 0.93; Hemoglobin 13.8; Platelets 235; Potassium 4.1; Sodium 134   Recent Lipid Panel No results found for: "CHOL", "TRIG", "HDL", "CHOLHDL", "VLDL", "LDLCALC", "LDLDIRECT"  Physical Exam:    VS:  BP 130/66 (BP Location: Left Arm, Patient Position: Sitting, Cuff Size: Normal)   Pulse 62   Ht '5\' 11"'$  (1.803 m)   Wt 153 lb 6.4 oz (69.6 kg)   SpO2 98%   BMI 21.39 kg/m     Wt Readings from Last 5 Encounters:  12/27/21 153 lb 3.2 oz (69.5 kg)  12/05/21 152 lb 12.8 oz (69.3 kg)  11/28/19 155 lb 7 oz (70.5 kg)  04/29/19 162 lb (73.5 kg)  01/10/16 155 lb (70.3 kg)    Constitutional: No acute distress Eyes: sclera non-icteric, normal conjunctiva and lids ENMT: normal dentition, moist mucous membranes Cardiovascular: regular rhythm, normal rate, no murmur. S1 and S2 normal. No jugular venous distention.  Respiratory: clear to auscultation bilaterally GI : normal bowel sounds, soft and nontender. No distention.   MSK: extremities warm, well perfused. No edema.  NEURO: grossly nonfocal exam, moves all  extremities. PSYCH: alert and oriented x 3, normal mood and affect.   ASSESSMENT:    1. Atrial fibrillation, unspecified type ([place])   2. Secondary hypercoagulable state ([place])   3. Essential hypertension   4. Hypercholesterolemia    PLAN:    Atrial fibrillation, unspecified type ([place]) - Plan: EKG 12-Lead Secondary hypercoagulable state ([place]) -in SR - continue eliquis 5 mg BID - not on BB due to fatigue and bradycardia, improved off of BB.  - will provide as needed diltiazem for palpitations.   Essential hypertension - cont amlodipine 2.5 mg daily (unable to titrate dose per patient intolerance), and irbesartan 75 mg daily.   Hypercholesterolemia - continue crestor 5 mg daily.    Total time of encounter: 30 minutes total time of encounter, including 20 minutes spent in face-to-face patient care on the date of this encounter. This time includes coordination of care and counseling regarding above mentioned problem list. Remainder of non-face-to-face time involved reviewing chart documents/testing relevant to the patient encounter and documentation in the medical record. I have independently reviewed documentation from referring provider.   Cherlynn Kaiser, MD, [place]   Shared Decision Making/Informed Consent:       Medication Adjustments/Labs and Tests Ordered: Current medicines are reviewed at length with the patient today.  Concerns regarding medicines are outlined above.   Orders Placed This Encounter  Procedures   EKG 12-Lead    Meds ordered this encounter  Medications   diltiazem (CARDIZEM) 30 MG tablet    Sig: Take 1 tablet (30 mg total) by mouth 3 (three) times daily as needed (for palpitations or atrial fib).    Dispense:  90 tablet    Refill:  3    Patient Instructions  Medication Instructions:  START: DILTIAZEM '30mg'$  UP TO THREE TIMES DAILY ONLY AS NEEDED FOR PALPITATIONS/ATRIAL FIB *If you need a refill on your cardiac medications  before your next appointment, please call your pharmacy*  Lab Work: None Ordered At This Time.  If you have labs (blood work) drawn  today and your tests are completely normal, you will receive your results only by: MyChart Message (if you have MyChart) OR A paper copy in the mail If you have any lab test that is abnormal or we need to change your treatment, we will call you to review the results.  Testing/Procedures: None Ordered At This Time.   Follow-Up: At [facility], you and your health needs are our priority.  As part of our continuing mission to provide you with exceptional heart care, we have created designated Provider Care Teams.  These Care Teams include your primary Cardiologist (physician) and Advanced Practice Providers (APPs -  Physician Assistants and Nurse Practitioners) who all work together to provide you with the care you need, when you need it.  Your next appointment:   6 month(s)  The format for your next appointment:   In Person  Provider:   Elouise Munroe, MD             I,Mary Mosetta Pigeon Buren,acting as a scribe for Elouise Munroe, MD.,have documented all relevant documentation on the behalf of Elouise Munroe, MD,as directed by  Elouise Munroe, MD while in the presence of Elouise Munroe, MD.  I, Elouise Munroe, MD, have reviewed all documentation for the visit on 02/13/2022. The documentation on today's date of service for the exam, diagnosis, procedures, and orders are all accurate and complete.

## 2022-02-24 DIAGNOSIS — L821 Other seborrheic keratosis: Secondary | ICD-10-CM | POA: Diagnosis not present

## 2022-02-24 DIAGNOSIS — D0461 Carcinoma in situ of skin of right upper limb, including shoulder: Secondary | ICD-10-CM | POA: Diagnosis not present

## 2022-03-13 ENCOUNTER — Telehealth: Payer: Self-pay | Admitting: Internal Medicine

## 2022-03-13 NOTE — Telephone Encounter (Signed)
I was called by the patient to report that he had gone back into atrial fibrillation after getting another COVID-vaccine today.  He has mild symptoms and feels palpitations, but he notes his heart rate has bounced between 90 and 140.  He took his first p.o. as needed diltiazem 30 mg at 8:15 PM no change in his heart rate yet.  Given that his symptoms are mild now, my recommendation was to wait 2 to 3 hours and if he is still having heart rates in the 90s to 140s then take another diltiazem 30 mg.  Then monitor his symptoms closely and he can take a third dose in the morning if his heart rate is still elevated.  At that time he should call back for further recommendations.  I let him know that if his symptoms worsen he should come to the emergency department immediately.  He stated understanding  Billey Chang MD Cardiology

## 2022-03-14 DIAGNOSIS — H10413 Chronic giant papillary conjunctivitis, bilateral: Secondary | ICD-10-CM | POA: Diagnosis not present

## 2022-03-14 DIAGNOSIS — H353131 Nonexudative age-related macular degeneration, bilateral, early dry stage: Secondary | ICD-10-CM | POA: Diagnosis not present

## 2022-03-14 DIAGNOSIS — H0102A Squamous blepharitis right eye, upper and lower eyelids: Secondary | ICD-10-CM | POA: Diagnosis not present

## 2022-03-14 DIAGNOSIS — H0102B Squamous blepharitis left eye, upper and lower eyelids: Secondary | ICD-10-CM | POA: Diagnosis not present

## 2022-03-14 DIAGNOSIS — Z961 Presence of intraocular lens: Secondary | ICD-10-CM | POA: Diagnosis not present

## 2022-03-14 DIAGNOSIS — H40013 Open angle with borderline findings, low risk, bilateral: Secondary | ICD-10-CM | POA: Diagnosis not present

## 2022-03-15 ENCOUNTER — Encounter: Payer: Self-pay | Admitting: Internal Medicine

## 2022-03-20 DIAGNOSIS — R3915 Urgency of urination: Secondary | ICD-10-CM | POA: Diagnosis not present

## 2022-03-20 DIAGNOSIS — N400 Enlarged prostate without lower urinary tract symptoms: Secondary | ICD-10-CM | POA: Diagnosis not present

## 2022-03-20 DIAGNOSIS — N398 Other specified disorders of urinary system: Secondary | ICD-10-CM | POA: Diagnosis not present

## 2022-04-10 DIAGNOSIS — E7800 Pure hypercholesterolemia, unspecified: Secondary | ICD-10-CM | POA: Diagnosis not present

## 2022-04-10 DIAGNOSIS — E039 Hypothyroidism, unspecified: Secondary | ICD-10-CM | POA: Diagnosis not present

## 2022-04-10 DIAGNOSIS — F900 Attention-deficit hyperactivity disorder, predominantly inattentive type: Secondary | ICD-10-CM | POA: Diagnosis not present

## 2022-04-10 DIAGNOSIS — I480 Paroxysmal atrial fibrillation: Secondary | ICD-10-CM | POA: Diagnosis not present

## 2022-04-10 DIAGNOSIS — I10 Essential (primary) hypertension: Secondary | ICD-10-CM | POA: Diagnosis not present

## 2022-04-10 DIAGNOSIS — M8580 Other specified disorders of bone density and structure, unspecified site: Secondary | ICD-10-CM | POA: Diagnosis not present

## 2022-04-17 ENCOUNTER — Encounter: Payer: Self-pay | Admitting: Internal Medicine

## 2022-04-20 DIAGNOSIS — Z08 Encounter for follow-up examination after completed treatment for malignant neoplasm: Secondary | ICD-10-CM | POA: Diagnosis not present

## 2022-04-20 DIAGNOSIS — R229 Localized swelling, mass and lump, unspecified: Secondary | ICD-10-CM | POA: Diagnosis not present

## 2022-04-20 DIAGNOSIS — L570 Actinic keratosis: Secondary | ICD-10-CM | POA: Diagnosis not present

## 2022-04-20 DIAGNOSIS — C44622 Squamous cell carcinoma of skin of right upper limb, including shoulder: Secondary | ICD-10-CM | POA: Diagnosis not present

## 2022-04-20 DIAGNOSIS — D1801 Hemangioma of skin and subcutaneous tissue: Secondary | ICD-10-CM | POA: Diagnosis not present

## 2022-04-20 DIAGNOSIS — Z85828 Personal history of other malignant neoplasm of skin: Secondary | ICD-10-CM | POA: Diagnosis not present

## 2022-04-20 DIAGNOSIS — L439 Lichen planus, unspecified: Secondary | ICD-10-CM | POA: Diagnosis not present

## 2022-04-20 DIAGNOSIS — R202 Paresthesia of skin: Secondary | ICD-10-CM | POA: Diagnosis not present

## 2022-04-20 DIAGNOSIS — D485 Neoplasm of uncertain behavior of skin: Secondary | ICD-10-CM | POA: Diagnosis not present

## 2022-05-18 ENCOUNTER — Ambulatory Visit (HOSPITAL_COMMUNITY)
Admission: RE | Admit: 2022-05-18 | Discharge: 2022-05-18 | Disposition: A | Discharge status: Home / Self Care | Payer: Medicare PPO | Source: Ambulatory Visit | Attending: Nurse Practitioner | Admitting: Nurse Practitioner

## 2022-05-18 ENCOUNTER — Encounter (HOSPITAL_COMMUNITY): Payer: Self-pay | Admitting: Nurse Practitioner

## 2022-05-18 VITALS — BP 130/70 | HR 71 | Ht 71.0 in | Wt 151.8 lb

## 2022-05-18 DIAGNOSIS — I10 Essential (primary) hypertension: Secondary | ICD-10-CM | POA: Diagnosis not present

## 2022-05-18 DIAGNOSIS — Z7901 Long term (current) use of anticoagulants: Secondary | ICD-10-CM | POA: Diagnosis not present

## 2022-05-18 DIAGNOSIS — D6869 Other thrombophilia: Secondary | ICD-10-CM

## 2022-05-18 DIAGNOSIS — I4892 Unspecified atrial flutter: Secondary | ICD-10-CM | POA: Diagnosis not present

## 2022-05-18 DIAGNOSIS — I4891 Unspecified atrial fibrillation: Secondary | ICD-10-CM | POA: Diagnosis not present

## 2022-05-18 LAB — BASIC METABOLIC PANEL
Anion gap: 7 (ref 5–15)
BUN: 20 mg/dL (ref 8–23)
CO2: 27 mmol/L (ref 22–32)
Calcium: 8.5 mg/dL — ABNORMAL LOW (ref 8.9–10.3)
Chloride: 96 mmol/L — ABNORMAL LOW (ref 98–111)
Creatinine, Ser: 1 mg/dL (ref 0.61–1.24)
GFR, Estimated: >60 mL/min (ref >60)
Glucose, Bld: 97 mg/dL (ref 70–99)
Potassium: 4.7 mmol/L (ref 3.5–5.1)
Sodium: 130 mmol/L — ABNORMAL LOW (ref 135–145)

## 2022-05-18 LAB — CBC
HCT: 39.7 % (ref 39.0–52.0)
Hemoglobin: 13.5 g/dL (ref 13.0–17.0)
MCH: 32.8 pg (ref 26.0–34.0)
MCHC: 34 g/dL (ref 30.0–36.0)
MCV: 96.6 fL (ref 80.0–100.0)
Platelets: 241 10*3/uL (ref 150–400)
RBC: 4.11 MIL/uL — ABNORMAL LOW (ref 4.22–5.81)
RDW: 12.1 % (ref 11.5–15.5)
WBC: 5.2 10*3/uL (ref 4.0–10.5)
nRBC: 0 % (ref 0.0–0.2)

## 2022-05-18 NOTE — Progress Notes (Signed)
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 male with a h/o SVT episode in 2022, hours after receiving Covid vaccine,  HTN, aflutter/afib just dx in the ER 11/27/21. He was discharged in afib with BB/eliquis on board with a CHA2DS2VASc  score of 3. Echo in the hospital showed normal EF. Monitor at time of SVT in 2022 showed  minimum HR 45 bpm and maximum HR 176bpm with average HR 63 bpm.  Predominant underlying rhythm was sinus rhythm.  First-degree AV block was present.  116 supraventricular tachycardia runs occurred, the run with the fastest interval lasting 9 beats with a max rate of 176 bpm, the longest lasting 12.6 seconds with an average rate of 103 bpm.  Ventricular bigeminy and trigeminy were present  He was referred to the afib clinic after recent ER visit with aflutter/afib . [name] saw pt on consult in the hospital and he is scheduled to see her in August. He is in rate controlled afib today. He showed me strips on his phone  that showed intermittent SR. He is not overly symptomatic with afib but has noticed some fatigue. He walks 4 miles a day, normal BMI, no tobacco. He drinks 1-2 glasses of wine nightly.  Sleep study in the past did not show any sleep apnea.  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   F/u in afib clinic 12/28/21. Since I last saw him, he had an ER visit for hypertensive  emergency as systolic BP at home over 081 systolic.  In the ER it was 149/98. He has not had any further elevation of BP like that. Since ER, BP has been 448-185'U systolic. Recent renal U/S without any  renal artery stenosis.He also had to stop his BB as his heart rate was in the 40's and he felt sluggish. He also reports that he appears to be in SR for the last several weeks and feels improved. He has  an apple watch to help monitor.   F/u in afib clinic, 05/18/22. He  asked to be seen as  he has had afib since 11/18, he is rate controlled but is taking 30 mg diltiazem every 4 hours during the day. He is holding  his irbesartan  because of this. His BP is stable. States no missed anticoagulation. He feels fatigued with afib. He states he has had 5 episodes of afib since I last saw him but can usually get back in rhythm with prn dilt.    Today, he denies symptoms of palpitations, chest pain, shortness of breath, orthopnea, PND, lower extremity edema, dizziness, presyncope, syncope, or neurologic sequela. The patient is tolerating medications without difficulties and is otherwise without complaint today.   Past Medical History:  Diagnosis Date   Asthma    Atrial fibrillation (HCC)    BPH (benign prostatic hyperplasia)    Cancer (HCC)    hx of skin cancer    Degenerative arthritis of cervical spine    c5-c6   Dysrhythmia    hx of pvcs in the past    Family history of adverse reaction to anesthesia    Father had idosyncratic narcotic  reaction   GERD (gastroesophageal reflux disease)    occ    Hyperlipidemia    Hypertension    Hypothyroidism    Osteopenia    Raynaud disease    Renal disease    stage 3,  patient not aware of this diagnosis   Thyroid disease    Past Surgical History:  Procedure Laterality Date   COLONOSCOPY WITH PROPOFOL N/A 01/10/2016   Procedure: COLONOSCOPY WITH PROPOFOL;  Surgeon: Garlan Fair, MD;  Location: [facility];  Service: Endoscopy;  Laterality: N/A;   HEMORROIDECTOMY     INGUINAL HERNIA REPAIR Bilateral 12/01/2019   Procedure: LAPAROSCOPIC BILATERAL INGUINAL HERNIA REPAIR WITH MESH,UMBILICAL HERNIA REPAIR;  Surgeon: Kinsinger, Arta Bruce, MD;  Location: [facility];  Service: General;  Laterality: Bilateral;   PAROTIDECTOMY  2017   Dr. Redmond Baseman   PROSTATECTOMY      Current Outpatient Medications  Medication Sig Dispense Refill   albuterol (PROVENTIL HFA;VENTOLIN HFA) 108 (90 BASE) MCG/ACT inhaler Inhale 2 puffs into the lungs See admin  instructions. Inhale 2 puffs every morning, may inhale 2 puffs every 6 hours as needed for shortness of breath     alendronate (FOSAMAX) 70 MG tablet Take 70 mg by mouth once a week. Take on Saturdays     amLODipine (NORVASC) 2.5 MG tablet Take 2.5 mg by mouth every evening.     apixaban (ELIQUIS) 5 MG TABS tablet Take 1 tablet (5 mg total) by mouth 2 (two) times daily. 60 tablet 1   Bioflavonoid Products (ESTER C PO) Take 500 mg by mouth in the morning and at bedtime.     buPROPion (WELLBUTRIN XL) 300 MG 24 hr tablet Take 300 mg by mouth daily.     Cholecalciferol (VITAMIN D3) 5000 units CAPS Take 5,000 Units by mouth daily.     CINNAMON PO Take 2 tablets by mouth every evening.     Coenzyme Q10 300 MG CAPS Take 300 mg by mouth daily.     Collagen-Boron-Hyaluronic Acid ([employer] JOINT HEALTH TRIPLE ACTION PO) Take 1 tablet by mouth daily.     hydrALAZINE (APRESOLINE) 25 MG tablet Take 25 mg by mouth as needed.     irbesartan (AVAPRO) 75 MG tablet Take 75 mg by mouth at bedtime.     Ketotifen Fumarate (ALAWAY OP) Place 1 drop into both eyes daily as needed (allergies).     levothyroxine (SYNTHROID, LEVOTHROID) 200 MCG tablet Take 200 mcg by mouth every morning.     methylphenidate 18 MG PO CR tablet Take 18 mg by mouth daily.     mirabegron ER (MYRBETRIQ) 50 MG TB24 tablet Take 50 mg by mouth daily.     Multiple Vitamins-Minerals (CENTRUM ULTRA MENS PO) Take 1 tablet by mouth daily.      Multiple Vitamins-Minerals (ICAPS AREDS 2 PO) Take 1 tablet by mouth every evening.     Omega-3 Fatty Acids (OMEGA 3 500 PO) Take 500 mg by mouth in the morning and at bedtime.     QVAR REDIHALER 80 MCG/ACT inhaler Inhale 1 puff into the lungs 2 (two) times daily.     rosuvastatin (CRESTOR) 5 MG tablet Take 5 mg by mouth daily.     tadalafil (CIALIS) 5 MG tablet Take 5 mg by mouth daily.     TURMERIC CURCUMIN PO Take 500 mg by mouth every morning.     No current facility-administered medications for this  encounter.    Allergies  Allergen Reactions   Lisinopril Cough   Tape Other (See Comments)    RED IRRITATION IF LEFT ON LONG TIME    Social History   Socioeconomic History   Marital status: Married    Spouse name: Not on file   Number of children: 3  Years of education: 20   Highest education level: Not on file  Occupational History   Not on file  Tobacco Use   Smoking status: Former    Packs/day: 1.00    Years: 20.00    Total pack years: 20.00    Types: Cigarettes, Pipe    Quit date: 8    Years since quitting: 44.5   Smokeless tobacco: Never  Vaping Use   Vaping Use: Never used  Substance and Sexual Activity   Alcohol use: Yes    Alcohol/week: 7.0 standard drinks of alcohol    Types: 7 Glasses of wine per week   Drug use: No   Sexual activity: Yes  Other Topics Concern   Not on file  Social History Narrative   Not on file   Social Determinants of Health   Financial Resource Strain: Not on file  Food Insecurity: Not on file  Transportation Needs: No Transportation Needs (02/26/2019)   PRAPARE - Hydrologist (Medical): No    Lack of Transportation (Non-Medical): No  Physical Activity: Not on file  Stress: Not on file  Social Connections: Not on file  Intimate Partner Violence: Not At Risk (02/26/2019)   Humiliation, Afraid, Rape, and Kick questionnaire    Fear of Current or Ex-Partner: No    Emotionally Abused: No    Physically Abused: No    Sexually Abused: No    No family history on file.  ROS- All systems are reviewed and negative except as per the HPI above  Physical Exam: Vitals:   12/27/21 1332  BP: (!) 146/64  Pulse: 69  Weight: 69.5 kg  Height: '5\' 11"'$  (1.803 m)   Wt Readings from Last 3 Encounters:  12/27/21 69.5 kg  12/05/21 69.3 kg  11/28/19 70.5 kg    Labs: Lab Results  Component Value Date   NA 134 (L) 12/07/2021   K 4.1 12/07/2021   CL 99 12/07/2021   CO2 27 12/07/2021   GLUCOSE 94 12/07/2021    BUN 22 12/07/2021   CREATININE 0.93 12/07/2021   CALCIUM 8.8 (L) 12/07/2021   MG 2.2 11/27/2021   No results found for: "INR" No results found for: "CHOL", "HDL", "LDLCALC", "TRIG"   GEN- The patient is well appearing, alert and oriented x 3 today.   Head- normocephalic, atraumatic Eyes-  Sclera clear, conjunctiva pink Ears- hearing intact Oropharynx- clear Neck- supple, no JVP Lymph- no cervical lymphadenopathy Lungs- Clear to ausculation bilaterally, normal work of breathing Heart- Regular rate and rhythm, no murmurs, rubs or gallops, PMI not laterally displaced GI- soft, NT, ND, + BS Extremities- no clubbing, cyanosis, or edema MS- no significant deformity or atrophy Skin- no rash or lesion Psych- euthymic mood, full affect Neuro- strength and sensation are intact  EKG- Vent. rate 69 BPM PR interval 202 ms QRS duration 84 ms QT/QTcB 388/415 ms P-R-T axes 99 8 62 Normal sinus rhythm Normal ECG When compared with ECG of 07-Dec-2021 18:21, PREVIOUS ECG IS PRESENT  Echo-Nehme, Bransen L  Procedures:  ECHO COMPLETE WO IMAGING ENHANCING AGENT  Height:  Not recorded  Weight:  Not recorded  Blood Pressure:  Not recorded   Accession Number:  [id number]  Date of Study:  11/28/21  Ordering Provider:  Margie Billet, NP  Clinical Indications:  None Listed     Interpreting Physicians  Performing Staff  Janina Mayo, MD Tech:  Fidel Levy, RVT     ECHOCARDIOGRAM COMPLETE (Accession [id number]) (Order [id number])  Echocardiography Date: 11/28/2021 Department: [facility] EMERGENCY DEPARTMENT Released By/Authorizing: Margie Billet, NP (auto-released)    ECHOCARDIOGRAM COMPLETE Order #: [id number] Accession #: [id number]    Patient Info  Patient name: BENGIE KAUCHER  MRN: [id number]  Age: 81 y.o.  Sex: male    MyChart Results Release  MyChart Status: Active  Results Release       Vitals  BP Height Weight BSA (Calculated - sq m)           Order-Level Documents:  Scan on 11/28/2021 2:39 PM by Default, Provider, MD        Study Result      ECHOCARDIOGRAM REPORT         Patient Name:   EDY BELT Date of Exam: 11/28/2021  Medical Rec #:  [id number]         Height:       71.0 in  Accession #:    [id number]        Weight:       155.4 lb  Date of Birth:  1940/07/22          BSA:          1.894 m  Patient Age:    1 years          BP:           132/83 mmHg  Patient Gender: M                 HR:           77 bpm.  Exam Location:  Inpatient   Procedure: 2D Echo, Cardiac Doppler and Color Doppler   Indications:    I48.91* Unspeicified atrial fibrillation     History:        Patient has no prior history of Echocardiogram  examinations.                  Risk Factors:Hypertension, Dyslipidemia and GERD.     Sonographer:    Bernadene Person RDCS  Referring Phys: [id number] Margie Billet   IMPRESSIONS     1. Left ventricular ejection fraction, by estimation, is 60 to 65%. The  left ventricle has normal function. The left ventricle has no regional  wall motion abnormalities. There is mild left ventricular hypertrophy.  Left ventricular diastolic parameters  are indeterminate.   2. Right ventricular systolic function is normal. The right ventricular  size is normal. There is normal pulmonary artery systolic pressure.   3. The mitral valve is grossly normal. Trivial mitral valve  regurgitation.   4. The aortic valve is tricuspid. Aortic valve regurgitation is not  visualized.   5. Aortic no significant ascending aneurysm.   6. The inferior vena cava is normal in size with greater than 50%  respiratory variability, suggesting right atrial pressure of 3 mmHg    Assessment and Plan:  1. Afib/flutter  He has been out of rhythm since 11/18 and has not converted despite regular use of Diltiazem 30 mg q 4 hours.  Will schedule for cardioversion There is a cancellation 12/5 and he will take that appointment.  He is  off  daily BB for bradycardia   2. CHA2DS2VASc  score of 3 Continue eliquis 5 mg bid States no missed doses for at least the last 3 weeks   3. HTN Pt is holding irbesartan as he is taking Diltiazem On day of cardioversion go back to regular BP meds and start regular use of prn  diltiazem    I will see back one week after cardioversion and  refer to EP to discuss ablation  F/u with [name]  as scheduled   Geroge Baseman. Shenelle Klas, [facility] [address] [place], [place] [address] [phone]

## 2022-05-18 NOTE — H&P (View-Only) (Signed)
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 male with a h/o SVT episode in 2022, hours after receiving Covid vaccine,  HTN, aflutter/afib just dx in the ER 11/27/21. He was discharged in afib with BB/eliquis on board with a CHA2DS2VASc  score of 3. Echo in the hospital showed normal EF. Monitor at time of SVT in 2022 showed  minimum HR 45 bpm and maximum HR 176bpm with average HR 63 bpm.  Predominant underlying rhythm was sinus rhythm.  First-degree AV block was present.  116 supraventricular tachycardia runs occurred, the run with the fastest interval lasting 9 beats with a max rate of 176 bpm, the longest lasting 12.6 seconds with an average rate of 103 bpm.  Ventricular bigeminy and trigeminy were present  He was referred to the afib clinic after recent ER visit with aflutter/afib . [name] saw pt on consult in the hospital and he is scheduled to see her in August. He is in rate controlled afib today. He showed me strips on his phone  that showed intermittent SR. He is not overly symptomatic with afib but has noticed some fatigue. He walks 4 miles a day, normal BMI, no tobacco. He drinks 1-2 glasses of wine nightly.  Sleep study in the past did not show any sleep apnea.  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   F/u in afib clinic 12/28/21. Since I last saw him, he had an ER visit for hypertensive  emergency as systolic BP at home over 323 systolic.  In the ER it was 149/98. He has not had any further elevation of BP like that. Since ER, BP has been 557-322'G systolic. Recent renal U/S without any  renal artery stenosis.He also had to stop his BB as his heart rate was in the 40's and he felt sluggish. He also reports that he appears to be in SR for the last several weeks and feels improved. He has  an apple watch to help monitor.   F/u in afib clinic, 05/18/22. He  asked to be seen as  he has had afib since 11/18, he is rate controlled but is taking 30 mg diltiazem every 4 hours during the day. He is holding  his irbesartan  because of this. His BP is stable. States no missed anticoagulation. He feels fatigued with afib. He states he has had 5 episodes of afib since I last saw him but can usually get back in rhythm with prn dilt.    Today, he denies symptoms of palpitations, chest pain, shortness of breath, orthopnea, PND, lower extremity edema, dizziness, presyncope, syncope, or neurologic sequela. The patient is tolerating medications without difficulties and is otherwise without complaint today.   Past Medical History:  Diagnosis Date   Asthma    Atrial fibrillation (HCC)    BPH (benign prostatic hyperplasia)    Cancer (HCC)    hx of skin cancer    Degenerative arthritis of cervical spine    c5-c6   Dysrhythmia    hx of pvcs in the past    Family history of adverse reaction to anesthesia    Father had idosyncratic narcotic  reaction   GERD (gastroesophageal reflux disease)    occ    Hyperlipidemia    Hypertension    Hypothyroidism    Osteopenia    Raynaud disease    Renal disease    stage 3,  patient not aware of this diagnosis   Thyroid disease    Past Surgical History:  Procedure Laterality Date   COLONOSCOPY WITH PROPOFOL N/A 01/10/2016   Procedure: COLONOSCOPY WITH PROPOFOL;  Surgeon: Garlan Fair, MD;  Location: [facility];  Service: Endoscopy;  Laterality: N/A;   HEMORROIDECTOMY     INGUINAL HERNIA REPAIR Bilateral 12/01/2019   Procedure: LAPAROSCOPIC BILATERAL INGUINAL HERNIA REPAIR WITH MESH,UMBILICAL HERNIA REPAIR;  Surgeon: Kinsinger, Arta Bruce, MD;  Location: [facility];  Service: General;  Laterality: Bilateral;   PAROTIDECTOMY  2017   Dr. Redmond Baseman   PROSTATECTOMY      Current Outpatient Medications  Medication Sig Dispense Refill   albuterol (PROVENTIL HFA;VENTOLIN HFA) 108 (90 BASE) MCG/ACT inhaler Inhale 2 puffs into the lungs See admin  instructions. Inhale 2 puffs every morning, may inhale 2 puffs every 6 hours as needed for shortness of breath     alendronate (FOSAMAX) 70 MG tablet Take 70 mg by mouth once a week. Take on Saturdays     amLODipine (NORVASC) 2.5 MG tablet Take 2.5 mg by mouth every evening.     apixaban (ELIQUIS) 5 MG TABS tablet Take 1 tablet (5 mg total) by mouth 2 (two) times daily. 60 tablet 1   Bioflavonoid Products (ESTER C PO) Take 500 mg by mouth in the morning and at bedtime.     buPROPion (WELLBUTRIN XL) 300 MG 24 hr tablet Take 300 mg by mouth daily.     Cholecalciferol (VITAMIN D3) 5000 units CAPS Take 5,000 Units by mouth daily.     CINNAMON PO Take 2 tablets by mouth every evening.     Coenzyme Q10 300 MG CAPS Take 300 mg by mouth daily.     Collagen-Boron-Hyaluronic Acid ([employer] JOINT HEALTH TRIPLE ACTION PO) Take 1 tablet by mouth daily.     hydrALAZINE (APRESOLINE) 25 MG tablet Take 25 mg by mouth as needed.     irbesartan (AVAPRO) 75 MG tablet Take 75 mg by mouth at bedtime.     Ketotifen Fumarate (ALAWAY OP) Place 1 drop into both eyes daily as needed (allergies).     levothyroxine (SYNTHROID, LEVOTHROID) 200 MCG tablet Take 200 mcg by mouth every morning.     methylphenidate 18 MG PO CR tablet Take 18 mg by mouth daily.     mirabegron ER (MYRBETRIQ) 50 MG TB24 tablet Take 50 mg by mouth daily.     Multiple Vitamins-Minerals (CENTRUM ULTRA MENS PO) Take 1 tablet by mouth daily.      Multiple Vitamins-Minerals (ICAPS AREDS 2 PO) Take 1 tablet by mouth every evening.     Omega-3 Fatty Acids (OMEGA 3 500 PO) Take 500 mg by mouth in the morning and at bedtime.     QVAR REDIHALER 80 MCG/ACT inhaler Inhale 1 puff into the lungs 2 (two) times daily.     rosuvastatin (CRESTOR) 5 MG tablet Take 5 mg by mouth daily.     tadalafil (CIALIS) 5 MG tablet Take 5 mg by mouth daily.     TURMERIC CURCUMIN PO Take 500 mg by mouth every morning.     No current facility-administered medications for this  encounter.    Allergies  Allergen Reactions   Lisinopril Cough   Tape Other (See Comments)    RED IRRITATION IF LEFT ON LONG TIME    Social History   Socioeconomic History   Marital status: Married    Spouse name: Not on file   Number of children: 3  Years of education: 20   Highest education level: Not on file  Occupational History   Not on file  Tobacco Use   Smoking status: Former    Packs/day: 1.00    Years: 20.00    Total pack years: 20.00    Types: Cigarettes, Pipe    Quit date: 35    Years since quitting: 44.5   Smokeless tobacco: Never  Vaping Use   Vaping Use: Never used  Substance and Sexual Activity   Alcohol use: Yes    Alcohol/week: 7.0 standard drinks of alcohol    Types: 7 Glasses of wine per week   Drug use: No   Sexual activity: Yes  Other Topics Concern   Not on file  Social History Narrative   Not on file   Social Determinants of Health   Financial Resource Strain: Not on file  Food Insecurity: Not on file  Transportation Needs: No Transportation Needs (02/26/2019)   PRAPARE - Hydrologist (Medical): No    Lack of Transportation (Non-Medical): No  Physical Activity: Not on file  Stress: Not on file  Social Connections: Not on file  Intimate Partner Violence: Not At Risk (02/26/2019)   Humiliation, Afraid, Rape, and Kick questionnaire    Fear of Current or Ex-Partner: No    Emotionally Abused: No    Physically Abused: No    Sexually Abused: No    No family history on file.  ROS- All systems are reviewed and negative except as per the HPI above  Physical Exam: Vitals:   12/27/21 1332  BP: (!) 146/64  Pulse: 69  Weight: 69.5 kg  Height: '5\' 11"'$  (1.803 m)   Wt Readings from Last 3 Encounters:  12/27/21 69.5 kg  12/05/21 69.3 kg  11/28/19 70.5 kg    Labs: Lab Results  Component Value Date   NA 134 (L) 12/07/2021   K 4.1 12/07/2021   CL 99 12/07/2021   CO2 27 12/07/2021   GLUCOSE 94 12/07/2021    BUN 22 12/07/2021   CREATININE 0.93 12/07/2021   CALCIUM 8.8 (L) 12/07/2021   MG 2.2 11/27/2021   No results found for: "INR" No results found for: "CHOL", "HDL", "LDLCALC", "TRIG"   GEN- The patient is well appearing, alert and oriented x 3 today.   Head- normocephalic, atraumatic Eyes-  Sclera clear, conjunctiva pink Ears- hearing intact Oropharynx- clear Neck- supple, no JVP Lymph- no cervical lymphadenopathy Lungs- Clear to ausculation bilaterally, normal work of breathing Heart- Regular rate and rhythm, no murmurs, rubs or gallops, PMI not laterally displaced GI- soft, NT, ND, + BS Extremities- no clubbing, cyanosis, or edema MS- no significant deformity or atrophy Skin- no rash or lesion Psych- euthymic mood, full affect Neuro- strength and sensation are intact  EKG- Vent. rate 69 BPM PR interval 202 ms QRS duration 84 ms QT/QTcB 388/415 ms P-R-T axes 99 8 62 Normal sinus rhythm Normal ECG When compared with ECG of 07-Dec-2021 18:21, PREVIOUS ECG IS PRESENT  Echo-Goodall, Eldwin L  Procedures:  ECHO COMPLETE WO IMAGING ENHANCING AGENT  Height:  Not recorded  Weight:  Not recorded  Blood Pressure:  Not recorded   Accession Number:  [id number]  Date of Study:  11/28/21  Ordering Provider:  Margie Billet, NP  Clinical Indications:  None Listed     Interpreting Physicians  Performing Staff  Janina Mayo, MD Tech:  Fidel Levy, RVT     ECHOCARDIOGRAM COMPLETE (Accession [id number]) (Order [id number])  Echocardiography Date: 11/28/2021 Department: [facility] EMERGENCY DEPARTMENT Released By/Authorizing: Margie Billet, NP (auto-released)    ECHOCARDIOGRAM COMPLETE Order #: [id number] Accession #: [id number]    Patient Info  Patient name: TALAN GILDNER  MRN: [id number]  Age: 81 y.o.  Sex: male    MyChart Results Release  MyChart Status: Active  Results Release       Vitals  BP Height Weight BSA (Calculated - sq m)           Order-Level Documents:  Scan on 11/28/2021 2:39 PM by Default, Provider, MD        Study Result      ECHOCARDIOGRAM REPORT         Patient Name:   KANYON SEIBOLD Date of Exam: 11/28/2021  Medical Rec #:  [id number]         Height:       71.0 in  Accession #:    [id number]        Weight:       155.4 lb  Date of Birth:  02/13/1941          BSA:          1.894 m  Patient Age:    68 years          BP:           132/83 mmHg  Patient Gender: M                 HR:           77 bpm.  Exam Location:  Inpatient   Procedure: 2D Echo, Cardiac Doppler and Color Doppler   Indications:    I48.91* Unspeicified atrial fibrillation     History:        Patient has no prior history of Echocardiogram  examinations.                  Risk Factors:Hypertension, Dyslipidemia and GERD.     Sonographer:    Bernadene Person RDCS  Referring Phys: [id number] Margie Billet   IMPRESSIONS     1. Left ventricular ejection fraction, by estimation, is 60 to 65%. The  left ventricle has normal function. The left ventricle has no regional  wall motion abnormalities. There is mild left ventricular hypertrophy.  Left ventricular diastolic parameters  are indeterminate.   2. Right ventricular systolic function is normal. The right ventricular  size is normal. There is normal pulmonary artery systolic pressure.   3. The mitral valve is grossly normal. Trivial mitral valve  regurgitation.   4. The aortic valve is tricuspid. Aortic valve regurgitation is not  visualized.   5. Aortic no significant ascending aneurysm.   6. The inferior vena cava is normal in size with greater than 50%  respiratory variability, suggesting right atrial pressure of 3 mmHg    Assessment and Plan:  1. Afib/flutter  He has been out of rhythm since 11/18 and has not converted despite regular use of Diltiazem 30 mg q 4 hours.  Will schedule for cardioversion There is a cancellation 12/5 and he will take that appointment.  He is  off  daily BB for bradycardia   2. CHA2DS2VASc  score of 3 Continue eliquis 5 mg bid States no missed doses for at least the last 3 weeks   3. HTN Pt is holding irbesartan as he is taking Diltiazem On day of cardioversion go back to regular BP meds and start regular use of prn  diltiazem    I will see back one week after cardioversion and  refer to EP to discuss ablation  F/u with [name]  as scheduled   Geroge Baseman. Keryn Nessler, [facility] [address] [place], [place] [address] [phone]

## 2022-05-18 NOTE — Patient Instructions (Signed)
Cardioversion scheduled for: Tuesday, December 5th   - Arrive at the [facility] and go to admitting at [facility] not eat or drink anything after midnight the night prior to your procedure.   - Take all your morning medication (except diabetic medications) with a sip of water prior to arrival. - You will not be able to drive home after your procedure.    - Do NOT miss any doses of your blood thinner - if you should miss a dose please notify our office immediately.   - If you feel as if you go back into normal rhythm prior to scheduled cardioversion, please notify our office immediately.  If your procedure is canceled in the cardioversion suite you will be charged a cancellation fee.   If you are on weekly OZEMPIC, TRULICITY, MOUNJARO, OR BYDUREON  Hold medication 7 days prior to scheduled procedure/anesthesia.  Restart medication on the normal dosing day after scheduled procedure/anesthesia  If you are on daily BYETTA, WEGOVY, VICTOZA, ADLYXIN, OR RYBELSUS:   Hold medication 24 hours prior to scheduled procedure/anesthesia.   Restart medication on the following day after scheduled procedure/anesthesia   For those patients who have a scheduled procedure/anesthesia on the same day of the week as their dose, hold the medication on the day of surgery.  They can take their scheduled dose the week before.  **Patients on the above medications scheduled for elective procedures that have not held the medication for the appropriate amount of time are at risk of cancellation or change in the anesthetic plan.

## 2022-05-23 ENCOUNTER — Ambulatory Visit (HOSPITAL_COMMUNITY)
Admission: RE | Admit: 2022-05-23 | Discharge: 2022-05-23 | Disposition: A | Discharge status: Home / Self Care | Payer: Medicare PPO | Attending: Internal Medicine | Admitting: Internal Medicine

## 2022-05-23 ENCOUNTER — Ambulatory Visit (HOSPITAL_BASED_OUTPATIENT_CLINIC_OR_DEPARTMENT_OTHER): Payer: Medicare PPO | Admitting: Certified Registered"

## 2022-05-23 ENCOUNTER — Encounter (HOSPITAL_COMMUNITY): Payer: Self-pay | Admitting: Internal Medicine

## 2022-05-23 ENCOUNTER — Ambulatory Visit (HOSPITAL_COMMUNITY): Payer: Medicare PPO | Admitting: Certified Registered"

## 2022-05-23 ENCOUNTER — Other Ambulatory Visit: Payer: Self-pay

## 2022-05-23 ENCOUNTER — Encounter (HOSPITAL_COMMUNITY)
Admission: RE | Disposition: A | Discharge status: Home / Self Care | Payer: Self-pay | Source: Home / Self Care | Attending: Internal Medicine

## 2022-05-23 ENCOUNTER — Encounter: Payer: Self-pay | Admitting: Internal Medicine

## 2022-05-23 DIAGNOSIS — J45909 Unspecified asthma, uncomplicated: Secondary | ICD-10-CM | POA: Diagnosis not present

## 2022-05-23 DIAGNOSIS — Z87891 Personal history of nicotine dependence: Secondary | ICD-10-CM

## 2022-05-23 DIAGNOSIS — K219 Gastro-esophageal reflux disease without esophagitis: Secondary | ICD-10-CM | POA: Insufficient documentation

## 2022-05-23 DIAGNOSIS — N183 Chronic kidney disease, stage 3 unspecified: Secondary | ICD-10-CM | POA: Diagnosis not present

## 2022-05-23 DIAGNOSIS — I10 Essential (primary) hypertension: Secondary | ICD-10-CM

## 2022-05-23 DIAGNOSIS — I4892 Unspecified atrial flutter: Secondary | ICD-10-CM | POA: Insufficient documentation

## 2022-05-23 DIAGNOSIS — I129 Hypertensive chronic kidney disease with stage 1 through stage 4 chronic kidney disease, or unspecified chronic kidney disease: Secondary | ICD-10-CM | POA: Diagnosis not present

## 2022-05-23 DIAGNOSIS — I4891 Unspecified atrial fibrillation: Secondary | ICD-10-CM

## 2022-05-23 DIAGNOSIS — E039 Hypothyroidism, unspecified: Secondary | ICD-10-CM | POA: Insufficient documentation

## 2022-05-23 HISTORY — PX: CARDIOVERSION: SHX1299

## 2022-05-23 SURGERY — CARDIOVERSION
Anesthesia: General

## 2022-05-23 MED ORDER — LIDOCAINE 2% (20 MG/ML) 5 ML SYRINGE
INTRAMUSCULAR | Status: DC | PRN
  Administered 2022-05-23: 60 mg via INTRAVENOUS

## 2022-05-23 MED ORDER — SODIUM CHLORIDE 0.9 % IV SOLN: INTRAVENOUS | Status: DC

## 2022-05-23 MED ORDER — PROPOFOL 10 MG/ML IV BOLUS
INTRAVENOUS | Status: DC | PRN
  Administered 2022-05-23: 60 mg via INTRAVENOUS

## 2022-05-23 NOTE — CV Procedure (Signed)
    Electrical Cardioversion Procedure Note Michael Bates [id number] 1940/11/16  Procedure: Electrical Cardioversion Indications:  Atrial Fibrillation  Time Out: Verified patient identification, verified procedure,medications/allergies/relevent history reviewed, required imaging and test results available.  Performed  Procedure Details  The patient was NPO after midnight. Anesthesia was administered at the beside  by Dr. Merilyn Baba.  Cardioversion was done with synchronized biphasic defibrillation with AP pads with 200 Joules.  The patient converted to normal sinus rhythm. The patient tolerated the procedure well   IMPRESSION:  Successful cardioversion of atrial fibrillation Reviewed with patient's wife.    Michael Bates A Michael Bates 05/23/2022, 9:12 AM

## 2022-05-23 NOTE — Discharge Instructions (Signed)

## 2022-05-23 NOTE — Transfer of Care (Signed)
Immediate Anesthesia Transfer of Care Note  Patient: Michael Bates  Procedure(s) Performed: CARDIOVERSION  Patient Location: Endoscopy Unit  Anesthesia Type:General  Level of Consciousness: awake, alert , oriented, and sedated  Airway & Oxygen Therapy: Patient Spontanous Breathing  Post-op Assessment: Post -op Vital signs reviewed and stable  Post vital signs: stable  Last Vitals:  Vitals Value Taken Time  BP    Temp    Pulse    Resp    SpO2      Last Pain:  Vitals:   05/23/22 0824  TempSrc: Temporal  PainSc: 0-No pain         Complications: No notable events documented.

## 2022-05-23 NOTE — Anesthesia Postprocedure Evaluation (Signed)
Anesthesia Post Note  Patient: Michael Bates  Procedure(s) Performed: CARDIOVERSION     Patient location during evaluation: Endoscopy Anesthesia Type: General Level of consciousness: awake and alert Pain management: pain level controlled Vital Signs Assessment: post-procedure vital signs reviewed and stable Respiratory status: spontaneous breathing, nonlabored ventilation and respiratory function stable Cardiovascular status: blood pressure returned to baseline and stable Postop Assessment: no apparent nausea or vomiting Anesthetic complications: no   No notable events documented.  Last Vitals:  Vitals:   05/23/22 0918 05/23/22 0927  BP: 123/71 119/66  Pulse: (!) 55 (!) 55  Resp: 17 18  Temp: (!) 36.3 C   SpO2: 100% 100%    Last Pain:  Vitals:   05/23/22 0927  TempSrc:   PainSc: 0-No pain                 Lidia Collum

## 2022-05-23 NOTE — Interval H&P Note (Signed)
History and Physical Interval Note:  05/23/2022 8:18 AM  Michael Bates  has presented today for surgery, with the diagnosis of AFIB.  The various methods of treatment have been discussed with the patient and family. After consideration of risks, benefits and other options for treatment, the patient has consented to  Procedure(s): CARDIOVERSION (N/A) as a surgical intervention.  The patient's history has been reviewed, patient examined, no change in status, stable for surgery.  I have reviewed the patient's chart and labs.  Questions were answered to the patient's satisfaction.     Koki Buxton A Briseidy Spark

## 2022-05-23 NOTE — Anesthesia Procedure Notes (Signed)
Procedure Name: General with mask airway Date/Time: 05/23/2022 9:17 AM  Performed by: Lavell Luster, CRNAPre-anesthesia Checklist: Patient identified, Emergency Drugs available, Suction available, Patient being monitored and Timeout performed Patient Re-evaluated:Patient Re-evaluated prior to induction Oxygen Delivery Method: Ambu bag Preoxygenation: Pre-oxygenation with 100% oxygen Induction Type: IV induction Ventilation: Mask ventilation without difficulty and Oral airway inserted - appropriate to patient size Number of attempts: 1 Placement Confirmation: positive ETCO2 and breath sounds checked- equal and bilateral Dental Injury: Teeth and Oropharynx as per pre-operative assessment

## 2022-05-23 NOTE — Anesthesia Preprocedure Evaluation (Addendum)
Anesthesia Evaluation  Patient identified by MRN, date of birth, ID band Patient awake    Reviewed: Allergy & Precautions, NPO status , Patient's Chart, lab work & pertinent test results  History of Anesthesia Complications Negative for: history of anesthetic complications  Airway Mallampati: II  TM Distance: >3 FB Neck ROM: Full    Dental  (+) Missing,    Pulmonary asthma , former smoker   Pulmonary exam normal        Cardiovascular hypertension, Normal cardiovascular exam+ dysrhythmias Atrial Fibrillation      Neuro/Psych negative neurological ROS     GI/Hepatic Neg liver ROS,GERD  ,,  Endo/Other  Hypothyroidism    Renal/GU Renal disease  negative genitourinary   Musculoskeletal negative musculoskeletal ROS (+)    Abdominal   Peds  Hematology negative hematology ROS (+)   Anesthesia Other Findings   Reproductive/Obstetrics                             Anesthesia Physical Anesthesia Plan  ASA: 3  Anesthesia Plan: General   Post-op Pain Management: Minimal or no pain anticipated   Induction: Intravenous  PONV Risk Score and Plan: 2 and TIVA and Treatment may vary due to age or medical condition  Airway Management Planned: Mask  Additional Equipment: None  Intra-op Plan:   Post-operative Plan:   Informed Consent: I have reviewed the patients History and Physical, chart, labs and discussed the procedure including the risks, benefits and alternatives for the proposed anesthesia with the patient or authorized representative who has indicated his/her understanding and acceptance.       Plan Discussed with: CRNA  Anesthesia Plan Comments:        Anesthesia Quick Evaluation

## 2022-05-25 ENCOUNTER — Ambulatory Visit (HOSPITAL_COMMUNITY)
Admission: RE | Admit: 2022-05-25 | Discharge: 2022-05-25 | Disposition: A | Discharge status: Home / Self Care | Payer: Medicare PPO | Source: Ambulatory Visit | Attending: Nurse Practitioner | Admitting: Nurse Practitioner

## 2022-05-25 ENCOUNTER — Encounter (HOSPITAL_COMMUNITY): Payer: Self-pay | Admitting: Nurse Practitioner

## 2022-05-25 VITALS — BP 144/72 | HR 65 | Ht 71.0 in | Wt 152.6 lb

## 2022-05-25 DIAGNOSIS — Z7901 Long term (current) use of anticoagulants: Secondary | ICD-10-CM | POA: Insufficient documentation

## 2022-05-25 DIAGNOSIS — D6869 Other thrombophilia: Secondary | ICD-10-CM

## 2022-05-25 DIAGNOSIS — I10 Essential (primary) hypertension: Secondary | ICD-10-CM | POA: Insufficient documentation

## 2022-05-25 DIAGNOSIS — I4892 Unspecified atrial flutter: Secondary | ICD-10-CM | POA: Diagnosis not present

## 2022-05-25 DIAGNOSIS — I4891 Unspecified atrial fibrillation: Secondary | ICD-10-CM | POA: Diagnosis not present

## 2022-05-25 DIAGNOSIS — I471 Supraventricular tachycardia, unspecified: Secondary | ICD-10-CM | POA: Insufficient documentation

## 2022-05-25 NOTE — Progress Notes (Signed)
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 male with a h/o SVT episode in 2022, hours after receiving Covid vaccine,  HTN, aflutter/afib just dx in the ER 11/27/21. He was discharged in afib with BB/eliquis on board with a CHA2DS2VASc  score of 3. Echo in the hospital showed normal EF. Monitor at time of SVT in 2022 showed  minimum HR 45 bpm and maximum HR 176bpm with average HR 63 bpm.  Predominant underlying rhythm was sinus rhythm.  First-degree AV block was present.  116 supraventricular tachycardia runs occurred, the run with the fastest interval lasting 9 beats with a max rate of 176 bpm, the longest lasting 12.6 seconds with an average rate of 103 bpm.  Ventricular bigeminy and trigeminy were present  He was referred to the afib clinic after recent ER visit with aflutter/afib . [name] saw pt on consult in the hospital and he is scheduled to see her in August. He is in rate controlled afib today. He showed me strips on his phone  that showed intermittent SR. He is not overly symptomatic with afib but has noticed some fatigue. He walks 4 miles a day, normal BMI, no tobacco. He drinks 1-2 glasses of wine nightly.  Sleep study in the past did not show any sleep apnea.  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   F/u in afib clinic 12/28/21. Since I last saw him, he had an ER visit for hypertensive  emergency as systolic BP at home over 867 systolic.  In the ER it was 149/98. He has not had any further elevation of BP like that. Since ER, BP has been 672-094'B systolic. Recent renal U/S without any  renal artery stenosis.He also had to stop his BB as his heart rate was in the 40's and he felt sluggish. He also reports that he appears to be in SR for the last several weeks and feels improved. He has  an apple watch to help monitor.   F/u in afib clinic, 05/18/22. He  asked to be seen as  he has had afib since 11/18, he is rate controlled but is taking 30 mg diltiazem every 4 hours during the day. He is holding  his irbesartan  because of this. His BP is stable. States no missed anticoagulation. He feels fatigued with afib. He states he has had 5 episodes of afib since I last saw him but can usually get back in rhythm with prn dilt.   F/u in afib clinic, 05/25/22. Pt had cardioversion 12/5 but sent a patient message that afternoon that he was out of rhythm.  Today EKG shows SR. His Kardia strips were reviewed and it appears that the strips that were reading afib show SR with some PAC's. He was reassured. No afib seen on Kardia strips. He will be traveling for 2 weeks out of state around [facility]. Precautions re alcohol and afib discussed.   Today, he denies symptoms of palpitations, chest pain, shortness of breath, orthopnea, PND, lower extremity edema, dizziness, presyncope, syncope, or neurologic sequela. The patient is tolerating medications without difficulties and is otherwise without complaint today.   Past Medical History:  Diagnosis Date   Asthma    Atrial fibrillation (HCC)    BPH (benign prostatic hyperplasia)    Cancer (HCC)    hx of skin cancer    Degenerative arthritis of cervical spine  c5-c6   Dysrhythmia    hx of pvcs in the past    Family history of adverse reaction to anesthesia    Father had idosyncratic narcotic  reaction   GERD (gastroesophageal reflux disease)    occ    Hyperlipidemia    Hypertension    Hypothyroidism    Osteopenia    Raynaud disease    Renal disease    stage 3, patient not aware of this diagnosis   Thyroid disease    Past Surgical History:  Procedure Laterality Date   COLONOSCOPY WITH PROPOFOL N/A 01/10/2016   Procedure: COLONOSCOPY WITH PROPOFOL;  Surgeon: Garlan Fair, MD;  Location: [facility] ENDOSCOPY;  Service: Endoscopy;  Laterality: N/A;   HEMORROIDECTOMY     INGUINAL HERNIA REPAIR Bilateral 12/01/2019   Procedure:  LAPAROSCOPIC BILATERAL INGUINAL HERNIA REPAIR WITH MESH,UMBILICAL HERNIA REPAIR;  Surgeon: Kinsinger, Arta Bruce, MD;  Location: [facility] ORS;  Service: General;  Laterality: Bilateral;   PAROTIDECTOMY  2017   Dr. Redmond Baseman   PROSTATECTOMY      Current Outpatient Medications  Medication Sig Dispense Refill   albuterol (PROVENTIL HFA;VENTOLIN HFA) 108 (90 BASE) MCG/ACT inhaler Inhale 2 puffs into the lungs See admin instructions. Inhale 2 puffs every morning, may inhale 2 puffs every 6 hours as needed for shortness of breath     alendronate (FOSAMAX) 70 MG tablet Take 70 mg by mouth once a week. Take on Saturdays     amLODipine (NORVASC) 2.5 MG tablet Take 2.5 mg by mouth every evening.     apixaban (ELIQUIS) 5 MG TABS tablet Take 1 tablet (5 mg total) by mouth 2 (two) times daily. 60 tablet 1   Bioflavonoid Products (ESTER C PO) Take 500 mg by mouth in the morning and at bedtime.     buPROPion (WELLBUTRIN XL) 300 MG 24 hr tablet Take 300 mg by mouth daily.     Cholecalciferol (VITAMIN D3) 5000 units CAPS Take 5,000 Units by mouth daily.     CINNAMON PO Take 2 tablets by mouth every evening.     Coenzyme Q10 300 MG CAPS Take 300 mg by mouth daily.     Collagen-Boron-Hyaluronic Acid ([employer] JOINT HEALTH TRIPLE ACTION PO) Take 1 tablet by mouth daily.     diltiazem (CARDIZEM) 30 MG tablet Take 1 tablet (30 mg total) by mouth 3 (three) times daily as needed (for palpitations or atrial fib). 90 tablet 3   hydrALAZINE (APRESOLINE) 25 MG tablet Take 25 mg by mouth daily as needed (High BP).     irbesartan (AVAPRO) 75 MG tablet Take 75 mg by mouth at bedtime.     ketotifen (ZADITOR) 0.035 % ophthalmic solution Place 1 drop into both eyes daily as needed (allergies).     levothyroxine (SYNTHROID, LEVOTHROID) 200 MCG tablet Take 200 mcg by mouth daily before breakfast.     methylphenidate 18 MG PO CR tablet Take 18 mg by mouth daily.     mirabegron ER (MYRBETRIQ) 50 MG TB24 tablet Take 50 mg by mouth daily.      Multiple Vitamins-Minerals (CENTRUM ULTRA MENS PO) Take 1 tablet by mouth daily.      Multiple Vitamins-Minerals (ICAPS AREDS 2 PO) Take 1 tablet by mouth 2 (two) times daily.     Omega-3 Fatty Acids (OMEGA 3 500 PO) Take 500 mg by mouth in the morning and at bedtime.     QVAR REDIHALER 80 MCG/ACT inhaler Inhale 1 puff into the lungs 2 (two) times daily.  rosuvastatin (CRESTOR) 5 MG tablet Take 5 mg by mouth daily.     tadalafil (CIALIS) 5 MG tablet Take 5 mg by mouth daily.     TURMERIC CURCUMIN PO Take 500 mg by mouth every morning.     VYVANSE 30 MG capsule Take 30 mg by mouth every morning.     No current facility-administered medications for this encounter.    Allergies  Allergen Reactions   Lisinopril Cough   Tape Other (See Comments)    RED IRRITATION IF LEFT ON LONG TIME    Social History   Socioeconomic History   Marital status: Married    Spouse name: Not on file   Number of children: 3   Years of education: 20   Highest education level: Not on file  Occupational History   Not on file  Tobacco Use   Smoking status: Former    Packs/day: 1.00    Years: 20.00    Total pack years: 20.00    Types: Cigarettes, Pipe    Quit date: 54    Years since quitting: 44.9   Smokeless tobacco: Never  Vaping Use   Vaping Use: Never used  Substance and Sexual Activity   Alcohol use: Yes    Alcohol/week: 7.0 standard drinks of alcohol    Types: 7 Glasses of wine per week   Drug use: No   Sexual activity: Yes  Other Topics Concern   Not on file  Social History Narrative   Not on file   Social Determinants of Health   Financial Resource Strain: Not on file  Food Insecurity: Not on file  Transportation Needs: No Transportation Needs (02/26/2019)   PRAPARE - Hydrologist (Medical): No    Lack of Transportation (Non-Medical): No  Physical Activity: Not on file  Stress: Not on file  Social Connections: Not on file  Intimate Partner Violence:  Not At Risk (02/26/2019)   Humiliation, Afraid, Rape, and Kick questionnaire    Fear of Current or Ex-Partner: No    Emotionally Abused: No    Physically Abused: No    Sexually Abused: No    No family history on file.  ROS- All systems are reviewed and negative except as per the HPI above  Physical Exam: Vitals:   05/25/22 1058  BP: (!) 144/72  Pulse: 65  Weight: 69.2 kg  Height: '5\' 11"'$  (1.803 m)   Wt Readings from Last 3 Encounters:  05/25/22 69.2 kg  05/23/22 65.8 kg  05/18/22 68.9 kg    Labs: Lab Results  Component Value Date   NA 130 (L) 05/18/2022   K 4.7 05/18/2022   CL 96 (L) 05/18/2022   CO2 27 05/18/2022   GLUCOSE 97 05/18/2022   BUN 20 05/18/2022   CREATININE 1.00 05/18/2022   CALCIUM 8.5 (L) 05/18/2022   MG 2.2 11/27/2021   No results found for: "INR" No results found for: "CHOL", "HDL", "LDLCALC", "TRIG"   GEN- The patient is well appearing, alert and oriented x 3 today.   Head- normocephalic, atraumatic Eyes-  Sclera clear, conjunctiva pink Ears- hearing intact Oropharynx- clear Neck- supple, no JVP Lymph- no cervical lymphadenopathy Lungs- Clear to ausculation bilaterally, normal work of breathing Heart- Regular rate and rhythm, no murmurs, rubs or gallops, PMI not laterally displaced GI- soft, NT, ND, + BS Extremities- no clubbing, cyanosis, or edema MS- no significant deformity or atrophy Skin- no rash or lesion Psych- euthymic mood, full affect Neuro- strength and sensation  are intact  EKG-Vent. rate 65 BPM PR interval 196 ms QRS duration 88 ms QT/QTcB 392/407 ms P-R-T axes 93 25 67 Normal sinus rhythm Normal ECG When compared with ECG of 23-May-2022 09:14, PREVIOUS ECG IS PRESENT  Echo-Rolon, Madoc L  Procedures:  ECHO COMPLETE WO IMAGING ENHANCING AGENT  Height:  Not recorded  Weight:  Not recorded  Blood Pressure:  Not recorded   Accession Number:  [id number]  Date of Study:  11/28/21  Ordering Provider:  Margie Billet, NP   Clinical Indications:  None Listed     Interpreting Physicians  Performing Staff  Janina Mayo, MD Tech:  Fidel Levy, RVT     ECHOCARDIOGRAM COMPLETE (Accession [id number]) (Order [id number]) Echocardiography Date: 11/28/2021 Department: [place] Released By/Authorizing: Margie Billet, NP (auto-released)    ECHOCARDIOGRAM COMPLETE Order #: [id number] Accession #: [id number]    Patient Info  Patient name: AQUARIUS LATOUCHE  MRN: [id number]  Age: 81 y.o.  Sex: male    MyChart Results Release  MyChart Status: Active  Results Release       Vitals  BP Height Weight BSA (Calculated - sq m)          Order-Level Documents:  Scan on 11/28/2021 2:39 PM by Default, Provider, MD        Study Result      ECHOCARDIOGRAM REPORT         Patient Name:   SEVILLE DOWNS Date of Exam: 11/28/2021  Medical Rec #:  [id number]         Height:       71.0 in  Accession #:    [id number]        Weight:       155.4 lb  Date of Birth:  [birth year]          BSA:          1.894 m  Patient Age:    63 years          BP:           132/83 mmHg  Patient Gender: M                 HR:           77 bpm.  Exam Location:  Inpatient   Procedure: 2D Echo, Cardiac Doppler and Color Doppler   Indications:    I48.91* Unspeicified atrial fibrillation     History:        Patient has no prior history of Echocardiogram  examinations.                  Risk Factors:Hypertension, Dyslipidemia and GERD.     Sonographer:    Bernadene Person RDCS  Referring Phys: [id number] Margie Billet   IMPRESSIONS     1. Left ventricular ejection fraction, by estimation, is 60 to 65%. The  left ventricle has normal function. The left ventricle has no regional  wall motion abnormalities. There is mild left ventricular hypertrophy.  Left ventricular diastolic parameters  are indeterminate.   2. Right ventricular systolic function is normal. The right ventricular  size is  normal. There is normal pulmonary artery systolic pressure.   3. The mitral valve is grossly normal. Trivial mitral valve  regurgitation.   4. The aortic valve is tricuspid. Aortic valve regurgitation is not  visualized.   5. Aortic no significant ascending aneurysm.   6. The inferior vena cava is normal in size with  greater than 50%  respiratory variability, suggesting right atrial pressure of 3 mmHg    Assessment and Plan:  1. Afib/flutter  He had been out of rhythm since 11/18 and had not converted despite regular use of Diltiazem 30 mg q 4 hours.  Cardioversion successful and he thought he was having afib by Jodelle Red but strips reviewed and thought to  be in  SR with PAC's  He is off  daily BB for bradycardia   2. CHA2DS2VASc  score of 3 Continue eliquis 5 mg bid  3. HTN Stable     F/u with Dr. Myles Gip in January to discuss ablation   Geroge Baseman. Yolanda Huffstetler, [facility] [address] [place], [place] [address] [phone]

## 2022-05-28 ENCOUNTER — Encounter (HOSPITAL_COMMUNITY): Payer: Self-pay | Admitting: Internal Medicine

## 2022-06-05 ENCOUNTER — Ambulatory Visit (HOSPITAL_COMMUNITY): Payer: Medicare PPO | Admitting: Nurse Practitioner

## 2022-06-29 ENCOUNTER — Encounter: Payer: Self-pay | Admitting: Cardiovascular Disease

## 2022-06-29 ENCOUNTER — Ambulatory Visit: Payer: Medicare PPO | Attending: Cardiovascular Disease | Admitting: Cardiovascular Disease

## 2022-06-29 VITALS — BP 104/62 | HR 60 | Ht 71.0 in | Wt 150.0 lb

## 2022-06-29 DIAGNOSIS — I10 Essential (primary) hypertension: Secondary | ICD-10-CM

## 2022-06-29 DIAGNOSIS — D6859 Other primary thrombophilia: Secondary | ICD-10-CM | POA: Diagnosis not present

## 2022-06-29 DIAGNOSIS — I4892 Unspecified atrial flutter: Secondary | ICD-10-CM

## 2022-06-29 DIAGNOSIS — Z01812 Encounter for preprocedural laboratory examination: Secondary | ICD-10-CM

## 2022-06-29 DIAGNOSIS — I4891 Unspecified atrial fibrillation: Secondary | ICD-10-CM

## 2022-06-29 NOTE — Patient Instructions (Signed)
Medication Instructions:  Your physician recommends that you continue on your current medications as directed. Please refer to the Current Medication list given to you today.  *If you need a refill on your cardiac medications before your next appointment, please call your pharmacy*  Lab Work: On September 04, 2022: CBC, BMET (you do not need to be fasting) You may come to the lab any time between 7:30am and 4:30pm on this day.  If you have labs (blood work) drawn today and your tests are completely normal, you will receive your results only by: [place] (if you have MyChart) OR A paper copy in the mail If you have any lab test that is abnormal or we need to change your treatment, we will call you to review the results.  Testing/Procedures: Your physician has requested that you have cardiac CT. Cardiac computed tomography (CT) is a painless test that uses an x-ray machine to take clear, detailed pictures of your heart. For further information please visit [email or web address]. Please follow instruction sheet as given.   Your physician has recommended that you have an ablation. Catheter ablation is a medical procedure used to treat some cardiac arrhythmias (irregular heartbeats). During catheter ablation, a long, thin, flexible tube is put into a blood vessel in your groin (upper thigh), or neck. This tube is called an ablation catheter. It is then guided to your heart through the blood vessel. Radio frequency waves destroy small areas of heart tissue where abnormal heartbeats may cause an arrhythmia to start.  Your ablation procedure is scheduled for Tuesday September 19, 2022 at 07:30am. Dennis Bast will arrive at [facility] (main entrance A) at 05:30am.  Follow-Up: At [facility], you and your health needs are our priority.  As part of our continuing mission to provide you with exceptional heart care, we have created designated Provider Care Teams.  These Care Teams include your  primary Cardiologist (physician) and Advanced Practice Providers (APPs -  Physician Assistants and Nurse Practitioners) who all work together to provide you with the care you need, when you need it.  Your next appointment:   4 week(s) after your ablation  Provider:   You will follow up in the [facility] located at [facility]. Your provider will be: Roderic Palau, NP or Clint R. Fenton, PA-C

## 2022-06-29 NOTE — Addendum Note (Signed)
Addended by: Molli Barrows on: 06/29/2022 11:23 AM   Modules accepted: Orders

## 2022-06-29 NOTE — Progress Notes (Signed)
Electrophysiology Office Note:    Date:  06/29/2022   ID:  Michael Bates, DOB Nov 27, 1940, MRN [id number]  PCP:  Lajean Manes, [place] [facility] Cardiologist:  Elouise Munroe, MD Electrophysiologist:  Melida Quitter, MD     Referring MD: Sherran Needs, NP   History of Present Illness:    Michael Bates is a 82 y.o. male with a hx listed below, significant for SVT in 2022, atrial fibrillation and flutter referred for arrhythmia management.  Atrial fibrillation and flutter was diagnosed in June 2023 during an ER.  He was discharged with beta-blocker and Eliquis.  EF was normal.    He has been followed in AF clinic since with primarily atrial flutter. He has noted fatigue with episodes in the past.  Recently, his atrial flutter became persistent, and he was sent for cardioversion in December 2023.  He has been doing well since cardioversion. Today he is free of palpitations, shortness of breath, chest pain.  Past Medical History:  Diagnosis Date   Asthma    Atrial fibrillation (HCC)    BPH (benign prostatic hyperplasia)    Cancer (HCC)    hx of skin cancer    Degenerative arthritis of cervical spine    c5-c6   Dysrhythmia    hx of pvcs in the past    Family history of adverse reaction to anesthesia    Father had idosyncratic narcotic  reaction   GERD (gastroesophageal reflux disease)    occ    Hyperlipidemia    Hypertension    Hypothyroidism    Osteopenia    Raynaud disease    Renal disease    stage 3, patient not aware of this diagnosis   Thyroid disease     Past Surgical History:  Procedure Laterality Date   CARDIOVERSION N/A 05/23/2022   Procedure: CARDIOVERSION;  Surgeon: Werner Lean, MD;  Location: [facility];  Service: Cardiovascular;  Laterality: N/A;   COLONOSCOPY WITH PROPOFOL N/A 01/10/2016   Procedure: COLONOSCOPY WITH PROPOFOL;  Surgeon: Garlan Fair, MD;  Location: [facility];  Service: Endoscopy;   Laterality: N/A;   HEMORROIDECTOMY     INGUINAL HERNIA REPAIR Bilateral 12/01/2019   Procedure: LAPAROSCOPIC BILATERAL INGUINAL HERNIA REPAIR WITH MESH,UMBILICAL HERNIA REPAIR;  Surgeon: Kinsinger, Arta Bruce, MD;  Location: [facility];  Service: General;  Laterality: Bilateral;   PAROTIDECTOMY  2017   Dr. Redmond Baseman   PROSTATECTOMY      Current Medications: Current Meds  Medication Sig   albuterol (PROVENTIL HFA;VENTOLIN HFA) 108 (90 BASE) MCG/ACT inhaler Inhale 2 puffs into the lungs See admin instructions. Inhale 2 puffs every morning, may inhale 2 puffs every 6 hours as needed for shortness of breath   alendronate (FOSAMAX) 70 MG tablet Take 70 mg by mouth once a week. Take on Saturdays   amLODipine (NORVASC) 2.5 MG tablet Take 2.5 mg by mouth every evening.   apixaban (ELIQUIS) 5 MG TABS tablet Take 1 tablet (5 mg total) by mouth 2 (two) times daily.   Bioflavonoid Products (ESTER C PO) Take 500 mg by mouth in the morning and at bedtime.   buPROPion (WELLBUTRIN XL) 300 MG 24 hr tablet Take 300 mg by mouth daily.   Cholecalciferol (VITAMIN D3) 5000 units CAPS Take 5,000 Units by mouth daily.   CINNAMON PO Take 2 tablets by mouth every evening.   Coenzyme Q10 300 MG CAPS Take 300 mg by mouth daily.   Collagen-Boron-Hyaluronic Acid ([employer] JOINT  HEALTH TRIPLE ACTION PO) Take 1 tablet by mouth daily.   diltiazem (CARDIZEM) 30 MG tablet Take 1 tablet (30 mg total) by mouth 3 (three) times daily as needed (for palpitations or atrial fib).   hydrALAZINE (APRESOLINE) 25 MG tablet Take 25 mg by mouth daily as needed (High BP).   irbesartan (AVAPRO) 75 MG tablet Take 75 mg by mouth at bedtime.   ketotifen (ZADITOR) 0.035 % ophthalmic solution Place 1 drop into both eyes daily as needed (allergies).   levothyroxine (SYNTHROID, LEVOTHROID) 200 MCG tablet Take 200 mcg by mouth daily before breakfast.   methylphenidate 18 MG PO CR tablet Take 18 mg by mouth daily.   mirabegron ER (MYRBETRIQ) 50 MG TB24 tablet  Take 50 mg by mouth daily.   Multiple Vitamins-Minerals (CENTRUM ULTRA MENS PO) Take 1 tablet by mouth daily.    Multiple Vitamins-Minerals (ICAPS AREDS 2 PO) Take 1 tablet by mouth 2 (two) times daily.   Omega-3 Fatty Acids (OMEGA 3 500 PO) Take 500 mg by mouth in the morning and at bedtime.   QVAR REDIHALER 80 MCG/ACT inhaler Inhale 1 puff into the lungs 2 (two) times daily.   rosuvastatin (CRESTOR) 5 MG tablet Take 5 mg by mouth daily.   tadalafil (CIALIS) 5 MG tablet Take 5 mg by mouth daily.   TURMERIC CURCUMIN PO Take 500 mg by mouth every morning.   VYVANSE 30 MG capsule Take 30 mg by mouth every morning.     Allergies:   Lisinopril and Tape   Social History   Socioeconomic History   Marital status: Married    Spouse name: Not on file   Number of children: 3   Years of education: 20   Highest education level: Not on file  Occupational History   Not on file  Tobacco Use   Smoking status: Former    Packs/day: 1.00    Years: 20.00    Total pack years: 20.00    Types: Cigarettes, Pipe    Quit date: 21    Years since quitting: 45.0   Smokeless tobacco: Never  Vaping Use   Vaping Use: Never used  Substance and Sexual Activity   Alcohol use: Yes    Alcohol/week: 7.0 standard drinks of alcohol    Types: 7 Glasses of wine per week   Drug use: No   Sexual activity: Yes  Other Topics Concern   Not on file  Social History Narrative   Not on file   Social Determinants of Health   Financial Resource Strain: Not on file  Food Insecurity: Not on file  Transportation Needs: No Transportation Needs (02/26/2019)   PRAPARE - Hydrologist (Medical): No    Lack of Transportation (Non-Medical): No  Physical Activity: Not on file  Stress: Not on file  Social Connections: Not on file     Family History: The patient's family history is not on file.  ROS:   Please see the history of present illness.    All other systems reviewed and are  negative.  EKGs/Labs/Other Studies Reviewed Today:     Monitor: 2022 -- after SVT event. Sibus rhythm. HR 45-176, avg 63. There were runs of SVT with variable rate, longet 12.6 seconds. There was ventricular ectopy, sometimes occurring in bigeminy  EKG:  Last EKG results: today - NSR   Recent Labs: 11/27/2021: Magnesium 2.2; TSH 2.962 12/07/2021: ALT 34 05/18/2022: BUN 20; Creatinine, Ser 1.00; Hemoglobin 13.5; Platelets 241; Potassium 4.7; Sodium  130     Physical Exam:    VS:  BP 104/62   Pulse 60   Ht '5\' 11"'$  (1.803 m)   Wt 150 lb (68 kg)   SpO2 100%   BMI 20.92 kg/m     Wt Readings from Last 3 Encounters:  06/29/22 150 lb (68 kg)  05/25/22 152 lb 9.6 oz (69.2 kg)  05/23/22 145 lb (65.8 kg)     GEN: Well nourished, well developed in no acute distress CARDIAC: RRR, no murmurs, rubs, gallops RESPIRATORY:  Normal work of breathing MUSCULOSKELETAL: no edema    ASSESSMENT & PLAN:    Atrial fibrillation: symptomatic. We discussed the indication, rationale, logistics, anticipated benefits, and potential risks of the ablation procedure including but not limited to -- bleed at the groin access site, chest pain, damage to nearby organs such as the diaphragm, lungs, or esophagus, need for a drainage tube, or prolonged hospitalization. I explained that the risk for stroke, heart attack, need for open chest surgery, or even death is very low but not zero. We discussed antiarrhythmic therapy as an alternative. he  expressed understanding and wishes to proceed with scheduling ablation.  Typical atrial flutter: I suspect this is the predominant arrhythmia. Will plan for CTI ablation at the time of ablation. Secondary hypercoagulable state: Chads2Vasc 3. Continue eliquis Hypertension:        Medication Adjustments/Labs and Tests Ordered: Current medicines are reviewed at length with the patient today.  Concerns regarding medicines are outlined above.  Orders Placed This Encounter   Procedures   EKG 12-Lead   No orders of the defined types were placed in this encounter.    Signed, Melida Quitter, MD  06/29/2022 11:08 AM    [place]

## 2022-07-03 ENCOUNTER — Institutional Professional Consult (permissible substitution): Payer: Medicare PPO | Admitting: Cardiovascular Disease

## 2022-07-07 ENCOUNTER — Encounter: Payer: Self-pay | Admitting: Cardiovascular Disease

## 2022-07-27 ENCOUNTER — Encounter (HOSPITAL_COMMUNITY): Payer: Self-pay | Admitting: *Deleted

## 2022-08-18 ENCOUNTER — Ambulatory Visit: Payer: Medicare PPO | Attending: Internal Medicine | Admitting: Internal Medicine

## 2022-08-18 ENCOUNTER — Encounter: Payer: Self-pay | Admitting: Internal Medicine

## 2022-08-18 VITALS — BP 125/69 | HR 64 | Ht 71.0 in | Wt 154.0 lb

## 2022-08-18 DIAGNOSIS — I4892 Unspecified atrial flutter: Secondary | ICD-10-CM

## 2022-08-18 NOTE — Progress Notes (Signed)
Cardiology Office Note:    Date:  08/18/2022   ID:  Michael Bates, DOB Oct 11, 1940, MRN [id number]  PCP:  Charlane Ferretti, MD  Cardiologist:  Michael Munroe, MD  Electrophysiologist:  Melida Quitter, MD   Referring MD: Lajean Manes, MD   Chief Complaint/Reason for Referral: CC:Hospital Follow-Up  History of Present Illness:    Michael Bates is a 82 y.o. male with a history of SVT, HTN, afib/aflutter, here for hospital follow-up.  On 11/27/21, he went to the ER with SVT. On 12/05/21, he was seen in clinic. He showed strips on his phone of intermittent SR, and he showed rate controlled afib at the time. On 12/07/21, he went to the ED for HTN.  Today:  Seen by [facility] HTN clinic as well as Afib clinic since hospital dismissal. Went to ED with HTN 10 days after dismissal with home BP reading >200/100. BP at goal at CVRR visit, metoprolol decreased due to fatigue. Renal ultrasound unremarkable.  Anticipating ablation given recurrent symptoms and need for DCCV in December 2023.   Family did have COVID over the holidays, he is overall well.  Discussed indications for ablation of afib/flutter in detail.   The patient denies chest pain, chest pressure, dyspnea at rest or with exertion, palpitations, PND, orthopnea, or leg swelling. Denies cough, fever, chills. Denies nausea, vomiting. Denies syncope or presyncope. Denies dizziness or lightheadedness.     Past Medical History:  Diagnosis Date   Asthma    Atrial fibrillation (HCC)    BPH (benign prostatic hyperplasia)    Cancer (HCC)    hx of skin cancer    Degenerative arthritis of cervical spine    c5-c6   Dysrhythmia    hx of pvcs in the past    Family history of adverse reaction to anesthesia    Father had idosyncratic narcotic  reaction   GERD (gastroesophageal reflux disease)    occ    Hyperlipidemia    Hypertension    Hypothyroidism    Osteopenia    Raynaud disease    Renal disease    stage 3, patient not  aware of this diagnosis   Thyroid disease     Past Surgical History:  Procedure Laterality Date   CARDIOVERSION N/A 05/23/2022   Procedure: CARDIOVERSION;  Surgeon: Werner Lean, MD;  Location: [facility];  Service: Cardiovascular;  Laterality: N/A;   COLONOSCOPY WITH PROPOFOL N/A 01/10/2016   Procedure: COLONOSCOPY WITH PROPOFOL;  Surgeon: Garlan Fair, MD;  [facility];  Service: Endoscopy;  Laterality: N/A;   HEMORROIDECTOMY     INGUINAL HERNIA REPAIR Bilateral 12/01/2019   Procedure: LAPAROSCOPIC BILATERAL INGUINAL HERNIA REPAIR WITH MESH,UMBILICAL HERNIA REPAIR;  Surgeon: Kieth Brightly, Arta Bruce, MD;  Location: [facility];  Service: General;  Laterality: Bilateral;   PAROTIDECTOMY  2017   Dr. Redmond Baseman   PROSTATECTOMY      Current Medications: Current Meds  Medication Sig   albuterol (PROVENTIL HFA;VENTOLIN HFA) 108 (90 BASE) MCG/ACT inhaler Inhale 2 puffs into the lungs every 6 (six) hours as needed for wheezing or shortness of breath. Inhale 2 puffs every morning, may inhale 2 puffs every 6 hours as needed for shortness of breath   alendronate (FOSAMAX) 70 MG tablet Take 70 mg by mouth every Saturday.   amLODipine (NORVASC) 2.5 MG tablet Take 2.5 mg by mouth every evening.   apixaban (ELIQUIS) 5 MG TABS tablet Take 1 tablet (5 mg total) by mouth 2 (two) times daily.  Bioflavonoid Products (ESTER C PO) Take 500 mg by mouth in the morning and at bedtime.   buPROPion (WELLBUTRIN XL) 300 MG 24 hr tablet Take 300 mg by mouth daily.   Cholecalciferol (VITAMIN D3) 5000 units CAPS Take 5,000 Units by mouth in the morning.   CINNAMON PO Take 2 tablets by mouth every evening. Advanced Strength Cinsulin   Coenzyme Q10 300 MG CAPS Take 300 mg by mouth in the morning.   Collagen-Boron-Hyaluronic Acid ([employer] JOINT HEALTH TRIPLE ACTION PO) Take 1 tablet by mouth in the morning.   diltiazem (CARDIZEM) 30 MG tablet Take 1 tablet (30 mg total) by mouth 3 (three) times daily as  needed (for palpitations or atrial fib).   hydrALAZINE (APRESOLINE) 25 MG tablet Take 25 mg by mouth 3 (three) times daily as needed (High BP).   irbesartan (AVAPRO) 75 MG tablet Take 75 mg by mouth in the morning.   ketotifen (ZADITOR) 0.035 % ophthalmic solution Place 1 drop into both eyes daily as needed (allergies).   levothyroxine (SYNTHROID, LEVOTHROID) 200 MCG tablet Take 200 mcg by mouth daily before breakfast.   mirabegron ER (MYRBETRIQ) 50 MG TB24 tablet Take 50 mg by mouth in the morning.   Multiple Vitamins-Minerals (CENTRUM ULTRA MENS PO) Take 1 tablet by mouth daily.    QVAR REDIHALER 80 MCG/ACT inhaler Inhale 1 puff into the lungs 2 (two) times daily.   rosuvastatin (CRESTOR) 5 MG tablet Take 5 mg by mouth in the morning.   tadalafil (CIALIS) 5 MG tablet Take 5 mg by mouth in the morning.   TURMERIC CURCUMIN PO Take 2,250 mg by mouth every morning.   VYVANSE 30 MG capsule Take 30 mg by mouth every morning.   [DISCONTINUED] methylphenidate 18 MG PO CR tablet Take 18 mg by mouth daily.   [DISCONTINUED] Multiple Vitamins-Minerals (ICAPS AREDS 2 PO) Take 1 tablet by mouth 2 (two) times daily.   [DISCONTINUED] Omega-3 Fatty Acids (OMEGA 3 500 PO) Take 500 mg by mouth in the morning and at bedtime.     Allergies:   Tape and Zestril [lisinopril]   Social History   Tobacco Use   Smoking status: Former    Packs/day: 1.00    Years: 20.00    Additional pack years: 0.00    Total pack years: 20.00    Types: Cigarettes, Pipe    Quit date: 1979    Years since quitting: 45.2   Smokeless tobacco: Never  Vaping Use   Vaping Use: Never used  Substance Use Topics   Alcohol use: Yes    Alcohol/week: 7.0 standard drinks of alcohol    Types: 7 Glasses of wine per week   Drug use: No     Family History: The patient's family history is not on file.  ROS:   Please see the history of present illness.     All other systems reviewed and are negative.  EKGs/Labs/Other Studies  Reviewed:    The following studies were reviewed today:  DG Chest 2 View 12/07/21:  FINDINGS: The heart size and mediastinal contours are within normal limits. Both lungs are clear. The visualized skeletal structures are unremarkable.   IMPRESSION: No active cardiopulmonary disease.  Echo 11/28/21:  IMPRESSIONS    1. Left ventricular ejection fraction, by estimation, is 60 to 65%. The  left ventricle has normal function. The left ventricle has no regional  wall motion abnormalities. There is mild left ventricular hypertrophy.  Left ventricular diastolic parameters  are indeterminate.  2. Right ventricular systolic function is normal. The right ventricular  size is normal. There is normal pulmonary artery systolic pressure.   3. The mitral valve is grossly normal. Trivial mitral valve  regurgitation.   4. The aortic valve is tricuspid. Aortic valve regurgitation is not  visualized.   5. Aortic no significant ascending aneurysm.   6. The inferior vena cava is normal in size with greater than 50%  respiratory variability, suggesting right atrial pressure of 3 mmHg.   Comparison(s): No prior Echocardiogram.   EKG:  08/18/22: NSR 02/13/22: Normal Sinus rhythm.   Imaging studies that I have independently reviewed today: n/a  Recent Labs: 11/27/2021: Magnesium 2.2; TSH 2.962 12/07/2021: ALT 34 09/04/2022: BUN 16; Creatinine, Ser 1.00; Hemoglobin 14.3; Platelets 247; Potassium 4.5; Sodium 133   Recent Lipid Panel No results found for: "CHOL", "TRIG", "HDL", "CHOLHDL", "VLDL", "LDLCALC", "LDLDIRECT"  Physical Exam:    VS:  BP 125/69   Pulse 64   Ht 5\' 11"  (1.803 m)   Wt 154 lb (69.9 kg)   SpO2 100%   BMI 21.48 kg/m     Wt Readings from Last 5 Encounters:  08/18/22 154 lb (69.9 kg)  06/29/22 150 lb (68 kg)  05/25/22 152 lb 9.6 oz (69.2 kg)  05/23/22 145 lb (65.8 kg)  05/18/22 151 lb 12.8 oz (68.9 kg)    Constitutional: No acute distress Eyes: sclera non-icteric,  normal conjunctiva and lids ENMT: normal dentition, moist mucous membranes Cardiovascular: regular rhythm, normal rate, no murmur. S1 and S2 normal. No jugular venous distention.  Respiratory: clear to auscultation bilaterally GI : normal bowel sounds, soft and nontender. No distention.   MSK: extremities warm, well perfused. No edema.  NEURO: grossly nonfocal exam, moves all extremities. PSYCH: alert and oriented x 3, normal mood and affect.   ASSESSMENT:    1. Atrial flutter with rapid ventricular response (HCC)     PLAN:    Atrial fibrillation, unspecified type ([place]) - Plan: EKG 12-Lead Secondary hypercoagulable state ([place]) -in SR - continue eliquis 5 mg BID - not on BB due to fatigue and bradycardia, improved off of BB.  - will provide as needed diltiazem for palpitations.  -anticipating ablation with Dr. Myles Gip.  Essential hypertension - cont amlodipine 2.5 mg daily (unable to titrate dose per patient intolerance), and irbesartan 75 mg daily.   Hypercholesterolemia - continue crestor 5 mg daily.    Total time of encounter: 30 minutes total time of encounter, including 20 minutes spent in face-to-face patient care on the date of this encounter. This time includes coordination of care and counseling regarding above mentioned problem list. Remainder of non-face-to-face time involved reviewing chart documents/testing relevant to the patient encounter and documentation in the medical record. I have independently reviewed documentation from referring provider.   Cherlynn Kaiser, MD, [place]   Shared Decision Making/Informed Consent:       Medication Adjustments/Labs and Tests Ordered: Current medicines are reviewed at length with the patient today.  Concerns regarding medicines are outlined above.   Orders Placed This Encounter  Procedures   EKG 12-Lead    No orders of the defined types were placed in this encounter.   Patient Instructions   Medication Instructions:  No Changes In Medications at this time.  *If you need a refill on your cardiac medications before your next appointment, please call your pharmacy*  Follow-Up: At [facility], you and your health needs are our priority.  As  part of our continuing mission to provide you with exceptional heart care, we have created designated Provider Care Teams.  These Care Teams include your primary Cardiologist (physician) and Advanced Practice Providers (APPs -  Physician Assistants and Nurse Practitioners) who all work together to provide you with the care you need, when you need it.  Your next appointment:   6 month(s)  Provider:   Elouise Munroe, MD

## 2022-08-18 NOTE — Patient Instructions (Signed)
Medication Instructions:  No Changes In Medications at this time.  *If you need a refill on your cardiac medications before your next appointment, please call your pharmacy*  Follow-Up: At [facility], you and your health needs are our priority.  As part of our continuing mission to provide you with exceptional heart care, we have created designated Provider Care Teams.  These Care Teams include your primary Cardiologist (physician) and Advanced Practice Providers (APPs -  Physician Assistants and Nurse Practitioners) who all work together to provide you with the care you need, when you need it.  Your next appointment:   6 month(s)  Provider:   Elouise Munroe, MD

## 2022-09-04 ENCOUNTER — Ambulatory Visit: Payer: Medicare PPO | Attending: Cardiovascular Disease

## 2022-09-04 DIAGNOSIS — Z01812 Encounter for preprocedural laboratory examination: Secondary | ICD-10-CM | POA: Diagnosis not present

## 2022-09-04 DIAGNOSIS — I4892 Unspecified atrial flutter: Secondary | ICD-10-CM | POA: Diagnosis not present

## 2022-09-05 LAB — CBC
Hematocrit: 42.3 % (ref 37.5–51.0)
Hemoglobin: 14.3 g/dL (ref 13.0–17.7)
MCH: 32.6 pg (ref 26.6–33.0)
MCHC: 33.8 g/dL (ref 31.5–35.7)
MCV: 97 fL (ref 79–97)
Platelets: 247 10*3/uL (ref 150–450)
RBC: 4.38 x10E6/uL (ref 4.14–5.80)
RDW: 12.7 % (ref 11.6–15.4)
WBC: 5.5 10*3/uL (ref 3.4–10.8)

## 2022-09-05 LAB — BASIC METABOLIC PANEL
BUN/Creatinine Ratio: 16 (ref 10–24)
BUN: 16 mg/dL (ref 8–27)
CO2: 23 mmol/L (ref 20–29)
Calcium: 9.2 mg/dL (ref 8.6–10.2)
Chloride: 93 mmol/L — ABNORMAL LOW (ref 96–106)
Creatinine, Ser: 1 mg/dL (ref 0.76–1.27)
Glucose: 88 mg/dL (ref 70–99)
Potassium: 4.5 mmol/L (ref 3.5–5.2)
Sodium: 133 mmol/L — ABNORMAL LOW (ref 134–144)
eGFR: 76 mL/min/{1.73_m2} (ref >59)

## 2022-09-11 ENCOUNTER — Telehealth (HOSPITAL_COMMUNITY): Payer: Self-pay | Admitting: *Deleted

## 2022-09-11 NOTE — Telephone Encounter (Signed)
Reaching out to patient to offer assistance regarding upcoming cardiac imaging study; pt verbalizes understanding of appt date/time, parking situation and where to check in, pre-test NPO status  and verified current allergies; name and call back number provided for further questions should they arise ? ?Adalaya Irion RN Navigator Cardiac Imaging ?[facility] ?[phone] office ?[phone] cell ? ?

## 2022-09-12 ENCOUNTER — Ambulatory Visit (HOSPITAL_BASED_OUTPATIENT_CLINIC_OR_DEPARTMENT_OTHER)
Admission: RE | Admit: 2022-09-12 | Discharge: 2022-09-12 | Disposition: A | Discharge status: Home / Self Care | Payer: Medicare PPO | Source: Ambulatory Visit | Attending: Cardiovascular Disease | Admitting: Cardiovascular Disease

## 2022-09-12 DIAGNOSIS — R911 Solitary pulmonary nodule: Secondary | ICD-10-CM | POA: Diagnosis not present

## 2022-09-12 DIAGNOSIS — I4891 Unspecified atrial fibrillation: Secondary | ICD-10-CM | POA: Insufficient documentation

## 2022-09-12 DIAGNOSIS — I4892 Unspecified atrial flutter: Secondary | ICD-10-CM | POA: Diagnosis not present

## 2022-09-12 MED ORDER — IOHEXOL 350 MG/ML SOLN
100.0000 mL | Freq: Once | INTRAVENOUS | Status: AC | PRN
  Administered 2022-09-12: 80 mL via INTRAVENOUS

## 2022-09-18 NOTE — Pre-Procedure Instructions (Signed)
Instructed patient on the following items: Arrival time 0515 Nothing to eat or drink after midnight No meds AM of procedure Responsible person to drive you home and stay with you for 24 hrs  Have you missed any doses of anti-coagulant Eliquis- hasn't missed any doses.  Don't take dose in the morning.

## 2022-09-19 ENCOUNTER — Encounter (HOSPITAL_COMMUNITY): Payer: Self-pay | Admitting: Cardiovascular Disease

## 2022-09-19 ENCOUNTER — Other Ambulatory Visit: Payer: Self-pay

## 2022-09-19 ENCOUNTER — Encounter (HOSPITAL_COMMUNITY)
Admission: RE | Disposition: A | Discharge status: Home / Self Care | Payer: Self-pay | Source: Home / Self Care | Attending: Cardiovascular Disease

## 2022-09-19 ENCOUNTER — Ambulatory Visit (HOSPITAL_BASED_OUTPATIENT_CLINIC_OR_DEPARTMENT_OTHER): Payer: Medicare PPO | Admitting: Certified Registered Nurse Anesthetist

## 2022-09-19 ENCOUNTER — Ambulatory Visit (HOSPITAL_COMMUNITY): Payer: Medicare PPO | Admitting: Certified Registered Nurse Anesthetist

## 2022-09-19 ENCOUNTER — Other Ambulatory Visit (HOSPITAL_COMMUNITY): Payer: Self-pay

## 2022-09-19 ENCOUNTER — Ambulatory Visit (HOSPITAL_COMMUNITY)
Admission: RE | Admit: 2022-09-19 | Discharge: 2022-09-19 | Disposition: A | Discharge status: Home / Self Care | Payer: Medicare PPO | Attending: Cardiovascular Disease | Admitting: Cardiovascular Disease

## 2022-09-19 DIAGNOSIS — I4892 Unspecified atrial flutter: Secondary | ICD-10-CM

## 2022-09-19 DIAGNOSIS — I10 Essential (primary) hypertension: Secondary | ICD-10-CM | POA: Diagnosis not present

## 2022-09-19 DIAGNOSIS — Z87891 Personal history of nicotine dependence: Secondary | ICD-10-CM | POA: Insufficient documentation

## 2022-09-19 DIAGNOSIS — K219 Gastro-esophageal reflux disease without esophagitis: Secondary | ICD-10-CM | POA: Diagnosis not present

## 2022-09-19 DIAGNOSIS — I483 Typical atrial flutter: Secondary | ICD-10-CM | POA: Insufficient documentation

## 2022-09-19 DIAGNOSIS — E039 Hypothyroidism, unspecified: Secondary | ICD-10-CM

## 2022-09-19 DIAGNOSIS — D6869 Other thrombophilia: Secondary | ICD-10-CM | POA: Diagnosis not present

## 2022-09-19 DIAGNOSIS — N183 Chronic kidney disease, stage 3 unspecified: Secondary | ICD-10-CM | POA: Insufficient documentation

## 2022-09-19 DIAGNOSIS — I129 Hypertensive chronic kidney disease with stage 1 through stage 4 chronic kidney disease, or unspecified chronic kidney disease: Secondary | ICD-10-CM | POA: Diagnosis not present

## 2022-09-19 DIAGNOSIS — Z7901 Long term (current) use of anticoagulants: Secondary | ICD-10-CM | POA: Insufficient documentation

## 2022-09-19 DIAGNOSIS — I4891 Unspecified atrial fibrillation: Secondary | ICD-10-CM | POA: Diagnosis not present

## 2022-09-19 HISTORY — PX: A-FLUTTER ABLATION: EP1230

## 2022-09-19 HISTORY — PX: ATRIAL FIBRILLATION ABLATION: EP1191

## 2022-09-19 LAB — POCT ACTIVATED CLOTTING TIME
Activated Clotting Time: 282 seconds
Activated Clotting Time: 298 seconds
Activated Clotting Time: 325 seconds

## 2022-09-19 SURGERY — ATRIAL FIBRILLATION ABLATION
Anesthesia: General

## 2022-09-19 MED ORDER — PROPOFOL 10 MG/ML IV BOLUS
INTRAVENOUS | Status: DC | PRN
  Administered 2022-09-19: 150 mg via INTRAVENOUS

## 2022-09-19 MED ORDER — COLCHICINE 0.6 MG PO TABS
0.6000 mg | ORAL_TABLET | Freq: Two times a day (BID) | ORAL | 0 refills | Status: DC
  Filled 2022-09-19: qty 10, 5d supply, fill #0

## 2022-09-19 MED ORDER — HEPARIN SODIUM (PORCINE) 1000 UNIT/ML IJ SOLN
INTRAMUSCULAR | Status: DC | PRN
  Administered 2022-09-19: 2000 [IU] via INTRAVENOUS
  Administered 2022-09-19: 11000 [IU] via INTRAVENOUS
  Administered 2022-09-19: 3000 [IU] via INTRAVENOUS

## 2022-09-19 MED ORDER — PANTOPRAZOLE SODIUM 40 MG PO TBEC
40.0000 mg | DELAYED_RELEASE_TABLET | Freq: Every day | ORAL | 0 refills | Status: DC
  Filled 2022-09-19: qty 30, 30d supply, fill #0

## 2022-09-19 MED ORDER — SODIUM CHLORIDE 0.9% FLUSH: 3.0000 mL | Freq: Two times a day (BID) | INTRAVENOUS | Status: DC

## 2022-09-19 MED ORDER — SODIUM CHLORIDE 0.9 % IV SOLN: 250.0000 mL | INTRAVENOUS | Status: DC | PRN

## 2022-09-19 MED ORDER — SUGAMMADEX SODIUM 200 MG/2ML IV SOLN
INTRAVENOUS | Status: DC | PRN
  Administered 2022-09-19: 200 mg via INTRAVENOUS

## 2022-09-19 MED ORDER — PHENYLEPHRINE 80 MCG/ML (10ML) SYRINGE FOR IV PUSH (FOR BLOOD PRESSURE SUPPORT)
PREFILLED_SYRINGE | INTRAVENOUS | Status: DC | PRN
  Administered 2022-09-19: 80 ug via INTRAVENOUS

## 2022-09-19 MED ORDER — FENTANYL CITRATE (PF) 250 MCG/5ML IJ SOLN
INTRAMUSCULAR | Status: DC | PRN
  Administered 2022-09-19 (×2): 50 ug via INTRAVENOUS

## 2022-09-19 MED ORDER — ROCURONIUM BROMIDE 10 MG/ML (PF) SYRINGE
PREFILLED_SYRINGE | INTRAVENOUS | Status: DC | PRN
  Administered 2022-09-19: 20 mg via INTRAVENOUS
  Administered 2022-09-19: 60 mg via INTRAVENOUS

## 2022-09-19 MED ORDER — SODIUM CHLORIDE 0.9% FLUSH: 3.0000 mL | INTRAVENOUS | Status: DC | PRN

## 2022-09-19 MED ORDER — HEPARIN (PORCINE) IN NACL 1000-0.9 UT/500ML-% IV SOLN
INTRAVENOUS | Status: DC | PRN
  Administered 2022-09-19 (×4): 500 mL

## 2022-09-19 MED ORDER — PHENYLEPHRINE HCL-NACL 20-0.9 MG/250ML-% IV SOLN
INTRAVENOUS | Status: DC | PRN
  Administered 2022-09-19: 30 ug/min via INTRAVENOUS

## 2022-09-19 MED ORDER — ONDANSETRON HCL 4 MG/2ML IJ SOLN: 4.0000 mg | Freq: Four times a day (QID) | INTRAMUSCULAR | Status: DC | PRN

## 2022-09-19 MED ORDER — LIDOCAINE 2% (20 MG/ML) 5 ML SYRINGE
INTRAMUSCULAR | Status: DC | PRN
  Administered 2022-09-19: 60 mg via INTRAVENOUS

## 2022-09-19 MED ORDER — DEXAMETHASONE SODIUM PHOSPHATE 10 MG/ML IJ SOLN
INTRAMUSCULAR | Status: DC | PRN
  Administered 2022-09-19: 10 mg via INTRAVENOUS

## 2022-09-19 MED ORDER — PROTAMINE SULFATE 10 MG/ML IV SOLN
INTRAVENOUS | Status: DC | PRN
  Administered 2022-09-19: 50 mg via INTRAVENOUS

## 2022-09-19 MED ORDER — HEPARIN SODIUM (PORCINE) 1000 UNIT/ML IJ SOLN
INTRAMUSCULAR | Status: AC
  Filled 2022-09-19: qty 10

## 2022-09-19 MED ORDER — ACETAMINOPHEN 325 MG PO TABS: 650.0000 mg | ORAL_TABLET | ORAL | Status: DC | PRN

## 2022-09-19 MED ORDER — DOBUTAMINE INFUSION FOR EP/ECHO/NUC (1000 MCG/ML)
INTRAVENOUS | Status: DC | PRN
  Administered 2022-09-19: 20 ug/kg/min via INTRAVENOUS

## 2022-09-19 MED ORDER — SODIUM CHLORIDE 0.9 % IV SOLN: INTRAVENOUS | Status: DC

## 2022-09-19 MED ORDER — DOBUTAMINE INFUSION FOR EP/ECHO/NUC (1000 MCG/ML)
INTRAVENOUS | Status: AC
  Filled 2022-09-19: qty 250

## 2022-09-19 MED ORDER — HEPARIN SODIUM (PORCINE) 1000 UNIT/ML IJ SOLN
INTRAMUSCULAR | Status: DC | PRN
  Administered 2022-09-19: 1000 [IU] via INTRAVENOUS

## 2022-09-19 SURGICAL SUPPLY — 19 items
BLANKET WARM UNDERBOD FULL ACC (MISCELLANEOUS) ×1 IMPLANT
CATH ABLAT QDOT MICRO BI TC FJ (CATHETERS) IMPLANT
CATH OCTARAY 2.0 F 3-3-3-3-3 (CATHETERS) IMPLANT
CATH PIGTAIL STEERABLE D1 8.7 (WIRE) IMPLANT
CATH S-M CIRCA TEMP PROBE (CATHETERS) IMPLANT
CATH SOUNDSTAR ECO 8FR (CATHETERS) IMPLANT
CATH WEBSTER BI DIR CS D-F CRV (CATHETERS) IMPLANT
CLOSURE PERCLOSE PROSTYLE (VASCULAR PRODUCTS) IMPLANT
COVER SWIFTLINK CONNECTOR (BAG) ×1 IMPLANT
DEVICE CLOSURE MYNXGRIP 6/7F (Vascular Products) IMPLANT
PACK EP LATEX FREE (CUSTOM PROCEDURE TRAY) ×1
PACK EP LF (CUSTOM PROCEDURE TRAY) ×1 IMPLANT
PAD DEFIB RADIO PHYSIO CONN (PAD) ×1 IMPLANT
PATCH CARTO3 (PAD) IMPLANT
SHEATH CARTO VIZIGO MED CURVE (SHEATH) IMPLANT
SHEATH PINNACLE 8F 10CM (SHEATH) IMPLANT
SHEATH PINNACLE 9F 10CM (SHEATH) IMPLANT
SHEATH PROBE COVER 6X72 (BAG) IMPLANT
TUBING SMART ABLATE COOLFLOW (TUBING) IMPLANT

## 2022-09-19 NOTE — Discharge Instructions (Addendum)
Cardiac Ablation, Care After  This sheet gives you information about how to care for yourself after your procedure. Your health care provider may also give you more specific instructions. If you have problems or questions, contact your health care provider. What can I expect after the procedure? After the procedure, it is common to have: Bruising around your puncture site. Tenderness around your puncture site. Skipped heartbeats. If you had an atrial fibrillation ablation, you may have atrial fibrillation during the first several months after your procedure.  Tiredness (fatigue).  Follow these instructions at home: Puncture site care  Follow instructions from your health care provider about how to take care of your puncture site. Make sure you: If present, leave stitches (sutures), skin glue, or adhesive strips in place. These skin closures may need to stay in place for up to 2 weeks. If adhesive strip edges start to loosen and curl up, you may trim the loose edges. Do not remove adhesive strips completely unless your health care provider tells you to do that. If a large square bandage is present, this may be removed 24 hours after surgery.  Check your puncture site every day for signs of infection. Check for: Redness, swelling, or pain. Fluid or blood. If your puncture site starts to bleed, lie down on your back, apply firm pressure to the area, and contact your health care provider. Warmth. Pus or a bad smell. A pea or small marble sized lump at the site is normal and can take up to three months to resolve.  Driving Do not drive for at least 4 days after your procedure or however long your health care provider recommends. (Do not resume driving if you have previously been instructed not to drive for other health reasons.) Do not drive or use heavy machinery while taking prescription pain medicine. Activity Avoid activities that take a lot of effort for at least 7 days after your  procedure. Do not lift anything that is heavier than 5 lb (4.5 kg) for one week.  No sexual activity for 1 week.  Return to your normal activities as told by your health care provider. Ask your health care provider what activities are safe for you. General instructions Take over-the-counter and prescription medicines only as told by your health care provider. Do not use any products that contain nicotine or tobacco, such as cigarettes and e-cigarettes. If you need help quitting, ask your health care provider. You may shower after 24 hours, but Do not take baths, swim, or use a hot tub for 1 week.  Do not drink alcohol for 24 hours after your procedure. Keep all follow-up visits as told by your health care provider. This is important. Contact a health care provider if: You have redness, mild swelling, or pain around your puncture site. You have fluid or blood coming from your puncture site that stops after applying firm pressure to the area. Your puncture site feels warm to the touch. You have pus or a bad smell coming from your puncture site. You have a fever. You have chest pain or discomfort that spreads to your neck, jaw, or arm. You have chest pain that is worse with lying on your back or taking a deep breath. You are sweating a lot. You feel nauseous. You have a fast or irregular heartbeat. You have shortness of breath. You are dizzy or light-headed and feel the need to lie down. You have pain or numbness in the arm or leg closest to your puncture   site. Get help right away if: Your puncture site suddenly swells. Your puncture site is bleeding and the bleeding does not stop after applying firm pressure to the area. These symptoms may represent a serious problem that is an emergency. Do not wait to see if the symptoms will go away. Get medical help right away. Call your local emergency services (911 in the [place] Do not drive yourself to the hospital. Summary After the procedure, it  is normal to have bruising and tenderness at the puncture site in your groin, neck, or forearm. Check your puncture site every day for signs of infection. Get help right away if your puncture site is bleeding and the bleeding does not stop after applying firm pressure to the area. This is a medical emergency. This information is not intended to replace advice given to you by your health care provider. Make sure you discuss any questions you have with your health care provider. Cardiac Ablation, Care After  This sheet gives you information about how to care for yourself after your procedure. Your health care provider may also give you more specific instructions. If you have problems or questions, contact your health care provider. What can I expect after the procedure? After the procedure, it is common to have: Bruising around your puncture site. Tenderness around your puncture site. Skipped heartbeats. If you had an atrial fibrillation ablation, you may have atrial fibrillation during the first several months after your procedure.  Tiredness (fatigue).  Follow these instructions at home: Puncture site care  Follow instructions from your health care provider about how to take care of your puncture site. Make sure you: If present, leave stitches (sutures), skin glue, or adhesive strips in place. These skin closures may need to stay in place for up to 2 weeks. If adhesive strip edges start to loosen and curl up, you may trim the loose edges. Do not remove adhesive strips completely unless your health care provider tells you to do that. If a large square bandage is present, this may be removed 24 hours after surgery.  Check your puncture site every day for signs of infection. Check for: Redness, swelling, or pain. Fluid or blood. If your puncture site starts to bleed, lie down on your back, apply firm pressure to the area, and contact your health care provider. Warmth. Pus or a bad smell. A pea  or small marble sized lump at the site is normal and can take up to three months to resolve.  Driving Do not drive for at least 4 days after your procedure or however long your health care provider recommends. (Do not resume driving if you have previously been instructed not to drive for other health reasons.) Do not drive or use heavy machinery while taking prescription pain medicine. Activity Avoid activities that take a lot of effort for at least 7 days after your procedure. Do not lift anything that is heavier than 5 lb (4.5 kg) for one week.  No sexual activity for 1 week.  Return to your normal activities as told by your health care provider. Ask your health care provider what activities are safe for you. General instructions Take over-the-counter and prescription medicines only as told by your health care provider. Do not use any products that contain nicotine or tobacco, such as cigarettes and e-cigarettes. If you need help quitting, ask your health care provider. You may shower after 24 hours, but Do not take baths, swim, or use a hot tub for 1 week.  Do not drink alcohol for 24 hours after your procedure. Keep all follow-up visits as told by your health care provider. This is important. Contact a health care provider if: You have redness, mild swelling, or pain around your puncture site. You have fluid or blood coming from your puncture site that stops after applying firm pressure to the area. Your puncture site feels warm to the touch. You have pus or a bad smell coming from your puncture site. You have a fever. You have chest pain or discomfort that spreads to your neck, jaw, or arm. You have chest pain that is worse with lying on your back or taking a deep breath. You are sweating a lot. You feel nauseous. You have a fast or irregular heartbeat. You have shortness of breath. You are dizzy or light-headed and feel the need to lie down. You have pain or numbness in the arm or  leg closest to your puncture site. Get help right away if: Your puncture site suddenly swells. Your puncture site is bleeding and the bleeding does not stop after applying firm pressure to the area. These symptoms may represent a serious problem that is an emergency. Do not wait to see if the symptoms will go away. Get medical help right away. Call your local emergency services (911 in the [place] Do not drive yourself to the hospital. Summary After the procedure, it is normal to have bruising and tenderness at the puncture site in your groin, neck, or forearm. Check your puncture site every day for signs of infection. Get help right away if your puncture site is bleeding and the bleeding does not stop after applying firm pressure to the area. This is a medical emergency. This information is not intended to replace advice given to you by your health care provider. Make sure you discuss any questions you have with your health care provider.   You have an appointment set up with the [facility].  Multiple studies have shown that being followed by a dedicated atrial fibrillation clinic in addition to the standard care you receive from your other physicians improves health. We believe that enrollment in the atrial fibrillation clinic will allow [place] to better care for you.   The phone number to the [facility] is [phone]. The clinic is staffed Monday through Friday from 8:30am to 5pm.  Directions: The clinic is located in the [facility], [place] the hospital at the MAIN ENTRANCE "A", use [address] to the 6th floor.  Registration desk to the right of elevators on 6th floor  If you have any trouble locating the clinic, please don't hesitate to call [phone].

## 2022-09-19 NOTE — Anesthesia Preprocedure Evaluation (Addendum)
Anesthesia Evaluation  Patient identified by MRN, date of birth, ID band Patient awake    Reviewed: Allergy & Precautions, NPO status , Patient's Chart, lab work & pertinent test results  Airway Mallampati: II  TM Distance: <3 FB Neck ROM: Full  Mouth opening: Limited Mouth Opening  Dental  (+) Teeth Intact, Dental Advisory Given   Pulmonary asthma , former smoker   breath sounds clear to auscultation       Cardiovascular hypertension, Pt. on medications + dysrhythmias Atrial Fibrillation  Rhythm:Irregular Rate:Bradycardia  Echo: 1. Left ventricular ejection fraction, by estimation, is 60 to 65%. The  left ventricle has normal function. The left ventricle has no regional  wall motion abnormalities. There is mild left ventricular hypertrophy.  Left ventricular diastolic parameters  are indeterminate.   2. Right ventricular systolic function is normal. The right ventricular  size is normal. There is normal pulmonary artery systolic pressure.   3. The mitral valve is grossly normal. Trivial mitral valve  regurgitation.   4. The aortic valve is tricuspid. Aortic valve regurgitation is not  visualized.   5. Aortic no significant ascending aneurysm.   6. The inferior vena cava is normal in size with greater than 50%  respiratory variability, suggesting right atrial pressure of 3 mmHg.     Neuro/Psych  PSYCHIATRIC DISORDERS      negative neurological ROS     GI/Hepatic Neg liver ROS,GERD  ,,  Endo/Other  Hypothyroidism    Renal/GU Renal disease     Musculoskeletal  (+) Arthritis ,    Abdominal   Peds  Hematology   Anesthesia Other Findings   Reproductive/Obstetrics                             Anesthesia Physical Anesthesia Plan  ASA: 3  Anesthesia Plan: General   Post-op Pain Management: Minimal or no pain anticipated   Induction: Intravenous  PONV Risk Score and Plan: 2 and  Ondansetron and Treatment may vary due to age or medical condition  Airway Management Planned: Oral ETT  Additional Equipment: None  Intra-op Plan:   Post-operative Plan: Extubation in OR  Informed Consent: I have reviewed the patients History and Physical, chart, labs and discussed the procedure including the risks, benefits and alternatives for the proposed anesthesia with the patient or authorized representative who has indicated his/her understanding and acceptance.     Dental advisory given  Plan Discussed with: CRNA  Anesthesia Plan Comments:        Anesthesia Quick Evaluation

## 2022-09-19 NOTE — Progress Notes (Signed)
Patient and wife was given discharge instructions. Both verbalized understanding. 

## 2022-09-19 NOTE — H&P (Signed)
Electrophysiology Office Note:    Date:  09/19/2022   ID:  Michael Bates, DOB [birth year], MRN [id number]  PCP:  Charlane Ferretti, MD   [facility] Cardiologist:  Elouise Munroe, MD Electrophysiologist:  Melida Quitter, MD     Referring MD: No ref. provider found   History of Present Illness:    Michael Bates is a 82 y.o. male with a hx listed below, significant for SVT in 2022, atrial fibrillation and flutter referred for arrhythmia management.  Atrial fibrillation and flutter was diagnosed in June 2023 during an ER.  He was discharged with beta-blocker and Eliquis.  EF was normal.    He has been followed in AF clinic since with primarily atrial flutter. He has noted fatigue with episodes in the past.  Recently, his atrial flutter became persistent, and he was sent for cardioversion in December 2023.  He has been doing well since cardioversion. Today he is free of palpitations, shortness of breath, chest pain.  I reviewed the patient's CT and labs. There was no LAA thrombus. he  has not missed any doses of anticoagulation, and he took his dose last night. There have been no changes in the patient's diagnoses, medications, or condition since our recent clinic visit.   Past Medical History:  Diagnosis Date   Asthma    Atrial fibrillation    BPH (benign prostatic hyperplasia)    Cancer    hx of skin cancer    Degenerative arthritis of cervical spine    c5-c6   Dysrhythmia    hx of pvcs in the past    Family history of adverse reaction to anesthesia    Father had idosyncratic narcotic  reaction   GERD (gastroesophageal reflux disease)    occ    Hyperlipidemia    Hypertension    Hypothyroidism    Osteopenia    Raynaud disease    Renal disease    stage 3, patient not aware of this diagnosis   Thyroid disease     Past Surgical History:  Procedure Laterality Date   CARDIOVERSION N/A 05/23/2022   Procedure: CARDIOVERSION;  Surgeon:  Werner Lean, MD;  Location: [facility];  Service: Cardiovascular;  Laterality: N/A;   COLONOSCOPY WITH PROPOFOL N/A 01/10/2016   Procedure: COLONOSCOPY WITH PROPOFOL;  Surgeon: Garlan Fair, MD;  Location: [facility];  Service: Endoscopy;  Laterality: N/A;   HEMORROIDECTOMY     INGUINAL HERNIA REPAIR Bilateral 12/01/2019   Procedure: LAPAROSCOPIC BILATERAL INGUINAL HERNIA REPAIR WITH MESH,UMBILICAL HERNIA REPAIR;  Surgeon: Kieth Brightly, Arta Bruce, MD;  Location: [facility];  Service: General;  Laterality: Bilateral;   PAROTIDECTOMY  2017   Dr. Redmond Baseman   PROSTATECTOMY      Current Medications: Current Meds  Medication Sig   albuterol (PROVENTIL HFA;VENTOLIN HFA) 108 (90 BASE) MCG/ACT inhaler Inhale 2 puffs into the lungs every 6 (six) hours as needed for wheezing or shortness of breath. Inhale 2 puffs every morning, may inhale 2 puffs every 6 hours as needed for shortness of breath   alendronate (FOSAMAX) 70 MG tablet Take 70 mg by mouth every Saturday.   amLODipine (NORVASC) 2.5 MG tablet Take 2.5 mg by mouth every evening.   apixaban (ELIQUIS) 5 MG TABS tablet Take 1 tablet (5 mg total) by mouth 2 (two) times daily.   Bioflavonoid Products (ESTER C PO) Take 500 mg by mouth in the morning and at bedtime.   buPROPion (WELLBUTRIN XL) 300 MG 24 hr  tablet Take 300 mg by mouth daily.   Cholecalciferol (VITAMIN D3) 5000 units CAPS Take 5,000 Units by mouth in the morning.   CINNAMON PO Take 2 tablets by mouth every evening. Advanced Strength Cinsulin   Coenzyme Q10 300 MG CAPS Take 300 mg by mouth in the morning.   Collagen-Boron-Hyaluronic Acid ([employer] JOINT HEALTH TRIPLE ACTION PO) Take 1 tablet by mouth in the morning.   irbesartan (AVAPRO) 75 MG tablet Take 75 mg by mouth in the morning.   levothyroxine (SYNTHROID, LEVOTHROID) 200 MCG tablet Take 200 mcg by mouth daily before breakfast.   mirabegron ER (MYRBETRIQ) 50 MG TB24 tablet Take 50 mg by mouth in the morning.   Multiple  Vitamins-Minerals (CENTRUM ULTRA MENS PO) Take 1 tablet by mouth daily.    Multiple Vitamins-Minerals (PRESERVISION AREDS 2 PO) Take 1 tablet by mouth in the morning and at bedtime.   Omega-3 Fatty Acids (SALMON OIL PO) Take 500 mg by mouth in the morning and at bedtime.   QVAR REDIHALER 80 MCG/ACT inhaler Inhale 1 puff into the lungs 2 (two) times daily.   rosuvastatin (CRESTOR) 5 MG tablet Take 5 mg by mouth in the morning.   tadalafil (CIALIS) 5 MG tablet Take 5 mg by mouth in the morning.   TURMERIC CURCUMIN PO Take 2,250 mg by mouth every morning.   VYVANSE 30 MG capsule Take 30 mg by mouth every morning.     Allergies:   Tape and Zestril [lisinopril]   Social History   Socioeconomic History   Marital status: Married    Spouse name: Not on file   Number of children: 3   Years of education: 20   Highest education level: Not on file  Occupational History   Not on file  Tobacco Use   Smoking status: Former    Packs/day: 1.00    Years: 20.00    Additional pack years: 0.00    Total pack years: 20.00    Types: Cigarettes, Pipe    Quit date: 1979    Years since quitting: 45.2   Smokeless tobacco: Never  Vaping Use   Vaping Use: Never used  Substance and Sexual Activity   Alcohol use: Yes    Alcohol/week: 7.0 standard drinks of alcohol    Types: 7 Glasses of wine per week   Drug use: No   Sexual activity: Yes  Other Topics Concern   Not on file  Social History Narrative   Not on file   Social Determinants of Health   Financial Resource Strain: Not on file  Food Insecurity: Not on file  Transportation Needs: No Transportation Needs (02/26/2019)   PRAPARE - Hydrologist (Medical): No    Lack of Transportation (Non-Medical): No  Physical Activity: Not on file  Stress: Not on file  Social Connections: Not on file     Family History: The patient's family history is not on file.  ROS:   Please see the history of present illness.    All  other systems reviewed and are negative.  EKGs/Labs/Other Studies Reviewed Today:     Monitor: 2022 -- after SVT event. Sibus rhythm. HR 45-176, avg 63. There were runs of SVT with variable rate, longet 12.6 seconds. There was ventricular ectopy, sometimes occurring in bigeminy  EKG:  Last EKG results: today - NSR   Recent Labs: 11/27/2021: Magnesium 2.2; TSH 2.962 12/07/2021: ALT 34 09/04/2022: BUN 16; Creatinine, Ser 1.00; Hemoglobin 14.3; Platelets 247; Potassium 4.5;  Sodium 133     Physical Exam:    VS:  BP (!) 148/79   Pulse (!) 58   Temp (!) 97.5 F (36.4 C) (Temporal)   Resp 18   Ht 5' 11.5" (1.816 m)   Wt 65.8 kg   SpO2 100%   BMI 19.94 kg/m     Wt Readings from Last 3 Encounters:  09/19/22 65.8 kg  08/18/22 69.9 kg  06/29/22 68 kg     GEN: Well nourished, well developed in no acute distress CARDIAC: RRR, no murmurs, rubs, gallops RESPIRATORY:  Normal work of breathing MUSCULOSKELETAL: no edema    ASSESSMENT & PLAN:    Atrial fibrillation: symptomatic. Rationale, risks, benefits were discussed previously in clinic. All questions answered today.  Typical atrial flutter: I suspect this is the predominant arrhythmia. Will plan for CTI ablation. Secondary hypercoagulable state: Chads2Vasc 3. Continue eliquis Hypertension:        Medication Adjustments/Labs and Tests Ordered: Current medicines are reviewed at length with the patient today.  Concerns regarding medicines are outlined above.  Orders Placed This Encounter  Procedures   Diet NPO time specified   Informed Consent Details: Physician/Practitioner Attestation; Transcribe to consent form and obtain patient signature   Initiate Pre-op Protocol   Void on call to EP Lab   Confirm CBC and BMP (or CMP) results within 7 days for inpatient and 30 days for outpatient:   Clip right and left femoral area PM before surgery   Clip right internal jugular area PM before surgery   Pre-admission testing  diagnosis   EP STUDY   Insert peripheral IV   Meds ordered this encounter  Medications   0.9 %  sodium chloride infusion     Signed, Melida Quitter, MD  09/19/2022 7:12 AM    [place]

## 2022-09-19 NOTE — Transfer of Care (Signed)
Immediate Anesthesia Transfer of Care Note  Patient: Michael Bates  Procedure(s) Performed: ATRIAL FIBRILLATION ABLATION A-FLUTTER ABLATION  Patient Location: Cath Lab  Anesthesia Type:General  Level of Consciousness: awake, alert , and oriented  Airway & Oxygen Therapy: Patient Spontanous Breathing  Post-op Assessment: Report given to RN and Post -op Vital signs reviewed and stable  Post vital signs: Reviewed and stable  Last Vitals:  Vitals Value Taken Time  BP 126/67 09/19/22 1036  Temp    Pulse 59 09/19/22 1039  Resp 13 09/19/22 1039  SpO2 96 % 09/19/22 1039  Vitals shown include unvalidated device data.  Last Pain:  Vitals:   09/19/22 0610  TempSrc: Temporal  PainSc: 0-No pain      Patients Stated Pain Goal: 4 ([id number])  Complications:  Encounter Notable Events  Notable Event Outcome Phase Comment  Difficult to intubate - expected  Intraprocedure Filed from anesthesia note documentation.

## 2022-09-19 NOTE — Anesthesia Procedure Notes (Signed)
Procedure Name: Intubation Date/Time: 09/19/2022 7:45 AM  Performed by: Dorthea Cove, CRNAPre-anesthesia Checklist: Patient identified, Emergency Drugs available, Suction available and Patient being monitored Patient Re-evaluated:Patient Re-evaluated prior to induction Oxygen Delivery Method: Circle system utilized Preoxygenation: Pre-oxygenation with 100% oxygen Induction Type: IV induction Ventilation: Mask ventilation without difficulty Laryngoscope Size: 4 and Glidescope Grade View: Grade I Tube type: Oral Tube size: 7.5 mm Number of attempts: 1 Airway Equipment and Method: Oral airway, Video-laryngoscopy and Rigid stylet Placement Confirmation: ETT inserted through vocal cords under direct vision, positive ETCO2 and breath sounds checked- equal and bilateral Secured at: 23 cm Tube secured with: Tape Dental Injury: Teeth and Oropharynx as per pre-operative assessment  Difficulty Due To: Difficulty was anticipated, Difficult Airway- due to limited oral opening and Difficult Airway- due to anterior larynx Comments: CRNA attempted with MAC 4 resulting in grade III view. Unable to pass tube. Mask ventilated. VSS. Glidego S4 utilized grade I view tube passed easily. Recommend video laryngoscopy for future intubations.

## 2022-09-20 ENCOUNTER — Encounter (HOSPITAL_COMMUNITY): Payer: Self-pay | Admitting: Cardiovascular Disease

## 2022-09-20 NOTE — Anesthesia Postprocedure Evaluation (Signed)
Anesthesia Post Note  Patient: TYESON TEITEL  Procedure(s) Performed: ATRIAL FIBRILLATION ABLATION A-FLUTTER ABLATION     Patient location during evaluation: PACU Anesthesia Type: General Level of consciousness: awake and alert Pain management: pain level controlled Vital Signs Assessment: post-procedure vital signs reviewed and stable Respiratory status: spontaneous breathing, nonlabored ventilation, respiratory function stable and patient connected to nasal cannula oxygen Cardiovascular status: blood pressure returned to baseline and stable Postop Assessment: no apparent nausea or vomiting Anesthetic complications: yes   Encounter Notable Events  Notable Event Outcome Phase Comment  Difficult to intubate - expected  Intraprocedure Filed from anesthesia note documentation.    Last Vitals:  Vitals:   09/19/22 1215 09/19/22 1230  BP: (!) 144/98 128/68  Pulse: 71 72  Resp: (!) 28 (!) 21  Temp:    SpO2: 97% 97%    Last Pain:  Vitals:   09/19/22 1107  TempSrc: Temporal  PainSc:                  Effie Berkshire

## 2022-10-03 ENCOUNTER — Other Ambulatory Visit (HOSPITAL_COMMUNITY): Payer: Self-pay

## 2022-10-19 ENCOUNTER — Ambulatory Visit (HOSPITAL_COMMUNITY)
Admission: RE | Admit: 2022-10-19 | Discharge: 2022-10-19 | Disposition: A | Discharge status: Home / Self Care | Payer: Medicare PPO | Source: Ambulatory Visit | Attending: Physician Assistant | Admitting: Physician Assistant

## 2022-10-19 VITALS — BP 144/72 | HR 63 | Ht 71.5 in | Wt 153.6 lb

## 2022-10-19 DIAGNOSIS — I4892 Unspecified atrial flutter: Secondary | ICD-10-CM | POA: Diagnosis not present

## 2022-10-19 DIAGNOSIS — D6869 Other thrombophilia: Secondary | ICD-10-CM | POA: Insufficient documentation

## 2022-10-19 DIAGNOSIS — I10 Essential (primary) hypertension: Secondary | ICD-10-CM | POA: Insufficient documentation

## 2022-10-19 DIAGNOSIS — I4819 Other persistent atrial fibrillation: Secondary | ICD-10-CM | POA: Insufficient documentation

## 2022-10-19 DIAGNOSIS — Z7901 Long term (current) use of anticoagulants: Secondary | ICD-10-CM | POA: Diagnosis not present

## 2022-10-19 NOTE — Progress Notes (Signed)
Primary Care Physician: Thana Ates, MD Referring Physician: ER f/u Cardiologist: Dr. Jacques Navy Primary EP: Dr Nelly Laurence   Michael Bates is a 82 y.o. male with a h/o SVT episode in 2022, hours after receiving Covid vaccine,  HTN, aflutter/afib just dx in the ER 11/27/21. He was discharged in afib with BB/eliquis on board with a CHA2DS2VASc  score of 3. Echo in the hospital showed normal EF. Monitor at time of SVT in 2022 showed minimum HR 45 bpm and maximum HR 176bpm with average HR 63 bpm.  Predominant underlying rhythm was sinus rhythm.  First-degree AV block was present.  116 supraventricular tachycardia runs occurred, the run with the fastest interval lasting 9 beats with a max rate of 176 bpm, the longest lasting 12.6 seconds with an average rate of 103 bpm.  Ventricular bigeminy and trigeminy were present  He was referred to the afib clinic after recent ER visit with aflutter/afib . Dr. Jacques Navy saw pt on consult in the hospital and he is scheduled to see her in August. He is in rate controlled afib today. He showed me strips on his phone  that showed intermittent SR. He is not overly symptomatic with afib but has noticed some fatigue. He walks 4 miles a day, normal BMI, no tobacco. He drinks 1-2 glasses of wine nightly.  Sleep study in the past did not show any sleep apnea.  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   F/u in afib clinic 12/28/21. Since I last saw him, he had an ER visit for hypertensive  emergency as systolic BP at home over 200 systolic.  In the ER it was 149/98. He has not had any further elevation of BP like that. Since ER, BP has been 120-140's systolic. Recent renal U/S without any  renal artery stenosis.He also had to stop his BB as his heart rate was in the 40's and he felt sluggish. He also reports that he appears to be in SR for the last several weeks and feels improved. He has  an apple watch to help monitor.   F/u in afib clinic, 05/18/22. He   asked to be seen as he has had afib since 11/18, he is rate controlled but is taking 30 mg diltiazem every 4 hours during the day. He is holding  his irbesartan  because of this. His BP is stable. States no missed anticoagulation. He feels fatigued with afib. He states he has had 5 episodes of afib since I last saw him but can usually get back in rhythm with prn dilt.   F/u in afib clinic, 05/25/22. Pt had cardioversion 12/5 but sent a patient message that afternoon that he was out of rhythm.  Today EKG shows SR. His Kardia strips were reviewed and it appears that the strips that were reading afib show SR with some PAC's. He was reassured. No afib seen on Kardia strips. He will be traveling for 2 weeks out of state around [facility]. Precautions re alcohol and afib discussed.   Follow up in the AF clinic 10/19/22. Patient is s/p afib and flutter ablation with Dr Nelly Laurence on 09/19/22. He reports that he has done well since the procedure with only one brief episode of afib. He denies chest pain, swallowing pain, or groin issues. He was having low BP readings at home (90s/50s). He discontinued irbesartan and his BP normalized.   Today, he denies symptoms of palpitations, chest pain, shortness of breath, orthopnea,  PND, lower extremity edema, dizziness, presyncope, syncope, or neurologic sequela. The patient is tolerating medications without difficulties and is otherwise without complaint today.   Past Medical History:  Diagnosis Date   Asthma    Atrial fibrillation (HCC)    BPH (benign prostatic hyperplasia)    Cancer (HCC)    hx of skin cancer    Degenerative arthritis of cervical spine    c5-c6   Dysrhythmia    hx of pvcs in the past    Family history of adverse reaction to anesthesia    Father had idosyncratic narcotic  reaction   GERD (gastroesophageal reflux disease)    occ    Hyperlipidemia    Hypertension    Hypothyroidism    Osteopenia    Raynaud disease    Renal disease    stage 3,  patient not aware of this diagnosis   Thyroid disease    Past Surgical History:  Procedure Laterality Date   A-FLUTTER ABLATION N/A 09/19/2022   Procedure: A-FLUTTER ABLATION;  Surgeon: Mealor, Roberts Gaudy, MD;  Location: [facility];  Service: Cardiovascular;  Laterality: N/A;   ATRIAL FIBRILLATION ABLATION N/A 09/19/2022   Procedure: ATRIAL FIBRILLATION ABLATION;  Surgeon: Maurice Small, MD;  Location: [facility] CV [facility];  Service: Cardiovascular;  Laterality: N/A;   CARDIOVERSION N/A 05/23/2022   Procedure: CARDIOVERSION;  Surgeon: Christell Constant, MD;  Location: [facility] ENDOSCOPY;  Service: Cardiovascular;  Laterality: N/A;   COLONOSCOPY WITH PROPOFOL N/A 01/10/2016   Procedure: COLONOSCOPY WITH PROPOFOL;  Surgeon: Charolett Bumpers, MD;  Location: [facility]OSCOPY;  Service: Endoscopy;  Laterality: N/A;   HEMORROIDECTOMY     INGUINAL HERNIA REPAIR Bilateral 12/01/2019   Procedure: LAPAROSCOPIC BILATERAL INGUINAL HERNIA REPAIR WITH MESH,UMBILICAL HERNIA REPAIR;  Surgeon: Sheliah Hatch, De Blanch, MD;  Location: [facility] ORS;  Service: General;  Laterality: Bilateral;   PAROTIDECTOMY  2017   Dr. Jenne Pane   PROSTATECTOMY      Current Outpatient Medications  Medication Sig Dispense Refill   albuterol (PROVENTIL HFA;VENTOLIN HFA) 108 (90 BASE) MCG/ACT inhaler Inhale 2 puffs into the lungs every 6 (six) hours as needed for wheezing or shortness of breath. Inhale 2 puffs every morning, may inhale 2 puffs every 6 hours as needed for shortness of breath     alendronate (FOSAMAX) 70 MG tablet Take 70 mg by mouth every Saturday.     amLODipine (NORVASC) 2.5 MG tablet Take 2.5 mg by mouth every evening.     apixaban (ELIQUIS) 5 MG TABS tablet Take 1 tablet (5 mg total) by mouth 2 (two) times daily. 60 tablet 1   Bioflavonoid Products (ESTER C PO) Take 500 mg by mouth in the morning and at bedtime.     buPROPion (WELLBUTRIN XL) 300 MG 24 hr tablet Take 300 mg by mouth daily.     Cholecalciferol (VITAMIN  D3) 5000 units CAPS Take 5,000 Units by mouth in the morning.     CINNAMON PO Take 2 tablets by mouth every evening. Advanced Strength Cinsulin     Coenzyme Q10 300 MG CAPS Take 300 mg by mouth in the morning.     Collagen-Boron-Hyaluronic Acid ([employer] JOINT HEALTH TRIPLE ACTION PO) Take 1 tablet by mouth in the morning.     diltiazem (CARDIZEM) 30 MG tablet Take 1 tablet (30 mg total) by mouth 3 (three) times daily as needed (for palpitations or atrial fib). 90 tablet 3   hydrALAZINE (APRESOLINE) 25 MG tablet Take 25 mg by mouth 3 (three)  times daily as needed (High BP).     ketotifen (ZADITOR) 0.035 % ophthalmic solution Place 1 drop into both eyes daily as needed (allergies).     levothyroxine (SYNTHROID, LEVOTHROID) 200 MCG tablet Take 200 mcg by mouth daily before breakfast.     mirabegron ER (MYRBETRIQ) 50 MG TB24 tablet Take 50 mg by mouth in the morning.     Multiple Vitamins-Minerals (CENTRUM ULTRA MENS PO) Take 1 tablet by mouth daily.      Multiple Vitamins-Minerals (PRESERVISION AREDS 2 PO) Take 1 tablet by mouth in the morning and at bedtime.     Omega-3 Fatty Acids (SALMON OIL PO) Take 500 mg by mouth in the morning and at bedtime.     QVAR REDIHALER 80 MCG/ACT inhaler Inhale 1 puff into the lungs 2 (two) times daily.     rosuvastatin (CRESTOR) 5 MG tablet Take 5 mg by mouth in the morning.     tadalafil (CIALIS) 5 MG tablet Take 5 mg by mouth in the morning.     TURMERIC CURCUMIN PO Take 2,250 mg by mouth every morning.     VYVANSE 30 MG capsule Take 30 mg by mouth every morning.     No current facility-administered medications for this encounter.    Allergies  Allergen Reactions   Tape Other (See Comments)    RED IRRITATION/SKIN PEELS IF LEFT ON LONG TIME   Zestril [Lisinopril] Cough    Social History   Socioeconomic History   Marital status: Married    Spouse name: Not on file   Number of children: 3   Years of education: 20   Highest education level: Not on file   Occupational History   Not on file  Tobacco Use   Smoking status: Former    Packs/day: 1.00    Years: 20.00    Additional pack years: 0.00    Total pack years: 20.00    Types: Cigarettes, Pipe    Quit date: 79    Years since quitting: 45.3   Smokeless tobacco: Never  Vaping Use   Vaping Use: Never used  Substance and Sexual Activity   Alcohol use: Yes    Alcohol/week: 7.0 standard drinks of alcohol    Types: 7 Glasses of wine per week   Drug use: No   Sexual activity: Yes  Other Topics Concern   Not on file  Social History Narrative   Not on file   Social Determinants of Health   Financial Resource Strain: Not on file  Food Insecurity: Not on file  Transportation Needs: No Transportation Needs (02/26/2019)   PRAPARE - Administrator, Civil Service (Medical): No    Lack of Transportation (Non-Medical): No  Physical Activity: Not on file  Stress: Not on file  Social Connections: Not on file  Intimate Partner Violence: Not At Risk (02/26/2019)   Humiliation, Afraid, Rape, and Kick questionnaire    Fear of Current or Ex-Partner: No    Emotionally Abused: No    Physically Abused: No    Sexually Abused: No    No family history on file.  ROS- All systems are reviewed and negative except as per the HPI above  Physical Exam: Vitals:   10/19/22 1341  BP: (!) 144/72  Pulse: 63  Weight: 69.7 kg  Height: 5' 11.5" (1.816 m)    Wt Readings from Last 3 Encounters:  10/19/22 69.7 kg  09/19/22 65.8 kg  08/18/22 69.9 kg    Labs: Lab Results  Component Value Date   NA 133 (L) 09/04/2022   K 4.5 09/04/2022   CL 93 (L) 09/04/2022   CO2 23 09/04/2022   GLUCOSE 88 09/04/2022   BUN 16 09/04/2022   CREATININE 1.00 09/04/2022   CALCIUM 9.2 09/04/2022   MG 2.2 11/27/2021   No results found for: "INR" No results found for: "CHOL", "HDL", "LDLCALC", "TRIG"   GEN- The patient is a well appearing elderly male, alert and oriented x 3 today.   HEENT-head  normocephalic, atraumatic, sclera clear, conjunctiva pink, hearing intact, trachea midline. Lungs- Clear to ausculation bilaterally, normal work of breathing Heart- Regular rate and rhythm, no murmurs, rubs or gallops  GI- soft, NT, ND, + BS Extremities- no clubbing, cyanosis, or edema MS- no significant deformity or atrophy Skin- no rash or lesion Psych- euthymic mood, full affect Neuro- strength and sensation are intact   EKG today demonstrates SR Vent. rate 63 BPM PR interval 196 ms QRS duration 86 ms QT/QTcB 390/399 ms   Echo 11/28/21  1. Left ventricular ejection fraction, by estimation, is 60 to 65%. The  left ventricle has normal function. The left ventricle has no regional  wall motion abnormalities. There is mild left ventricular hypertrophy.  Left ventricular diastolic parameters are indeterminate.   2. Right ventricular systolic function is normal. The right ventricular  size is normal. There is normal pulmonary artery systolic pressure.   3. The mitral valve is grossly normal. Trivial mitral valve  regurgitation.   4. The aortic valve is tricuspid. Aortic valve regurgitation is not  visualized.   5. Aortic no significant ascending aneurysm.   6. The inferior vena cava is normal in size with greater than 50%  respiratory variability, suggesting right atrial pressure of 3 mmHg.   Comparison(s): No prior Echocardiogram.    Epic notes reviewed.  CHA2DS2-VASc Score = 3  The patient's score is based upon: CHF History: 0 HTN History: 1 Diabetes History: 0 Stroke History: 0 Vascular Disease History: 0 Age Score: 2 Gender Score: 0       ASSESSMENT AND PLAN: 1. Persistent Atrial Fibrillation/atrial flutter The patient's CHA2DS2-VASc score is 3, indicating a 3.2% annual risk of stroke. S/p afib and flutter ablation 09/19/22 Patient appears to be maintaining SR.  Continue Eliquis 5 mg BID with no missed doses for 3 months post ablation. Continue diltiazem 30 mg q  4 hours PRN for heart racing. Off scheduled BB due to history of bradycardia.  Kardia mobile for home monitoring.   2. Secondary Hypercoagulable State (ICD10:  D68.69) The patient is at significant risk for stroke/thromboembolism based upon his CHA2DS2-VASc Score of 3.  Continue Apixaban (Eliquis).   3. HTN Stable, off irbesartan. No changes today.    Follow up with Dr Nelly Laurence 3 months post ablation.    Jorja Loa PA-C [facility] [address] [place], [place] [address] [phone]

## 2022-10-24 DIAGNOSIS — C44622 Squamous cell carcinoma of skin of right upper limb, including shoulder: Secondary | ICD-10-CM | POA: Diagnosis not present

## 2022-10-24 DIAGNOSIS — R229 Localized swelling, mass and lump, unspecified: Secondary | ICD-10-CM | POA: Diagnosis not present

## 2022-10-24 DIAGNOSIS — Z85828 Personal history of other malignant neoplasm of skin: Secondary | ICD-10-CM | POA: Diagnosis not present

## 2022-10-24 DIAGNOSIS — C44329 Squamous cell carcinoma of skin of other parts of face: Secondary | ICD-10-CM | POA: Diagnosis not present

## 2022-10-24 DIAGNOSIS — L570 Actinic keratosis: Secondary | ICD-10-CM | POA: Diagnosis not present

## 2022-10-24 DIAGNOSIS — L821 Other seborrheic keratosis: Secondary | ICD-10-CM | POA: Diagnosis not present

## 2022-10-24 DIAGNOSIS — Z08 Encounter for follow-up examination after completed treatment for malignant neoplasm: Secondary | ICD-10-CM | POA: Diagnosis not present

## 2022-10-24 DIAGNOSIS — D485 Neoplasm of uncertain behavior of skin: Secondary | ICD-10-CM | POA: Diagnosis not present

## 2022-10-27 ENCOUNTER — Ambulatory Visit: Payer: Medicare PPO | Admitting: Cardiovascular Disease

## 2022-11-02 ENCOUNTER — Telehealth (HOSPITAL_COMMUNITY): Payer: Self-pay | Admitting: *Deleted

## 2022-11-02 NOTE — Telephone Encounter (Signed)
Patient called in stating he went back into afib this morning HRs 90-140s. Pt had restarted irebartan last PM on his own b/c he his BP had been running high at home. Discussed with Jorja Loa PA will hold irebsartan and use PRN cardizem for elevated rates as BP will allow. Appt made for Monday if persistent will schedule for dccv. Pt in agreement.

## 2022-11-06 ENCOUNTER — Ambulatory Visit (HOSPITAL_COMMUNITY)
Admission: RE | Admit: 2022-11-06 | Discharge: 2022-11-06 | Disposition: A | Discharge status: Home / Self Care | Payer: Medicare PPO | Source: Ambulatory Visit | Attending: Physician Assistant | Admitting: Physician Assistant

## 2022-11-06 ENCOUNTER — Encounter (HOSPITAL_COMMUNITY): Payer: Self-pay | Admitting: *Deleted

## 2022-11-06 VITALS — BP 134/74 | HR 83 | Ht 71.5 in | Wt 155.4 lb

## 2022-11-06 DIAGNOSIS — Z7901 Long term (current) use of anticoagulants: Secondary | ICD-10-CM | POA: Diagnosis not present

## 2022-11-06 DIAGNOSIS — I10 Essential (primary) hypertension: Secondary | ICD-10-CM | POA: Insufficient documentation

## 2022-11-06 DIAGNOSIS — I4819 Other persistent atrial fibrillation: Secondary | ICD-10-CM | POA: Insufficient documentation

## 2022-11-06 DIAGNOSIS — I4892 Unspecified atrial flutter: Secondary | ICD-10-CM | POA: Insufficient documentation

## 2022-11-06 DIAGNOSIS — D6869 Other thrombophilia: Secondary | ICD-10-CM | POA: Insufficient documentation

## 2022-11-06 DIAGNOSIS — I484 Atypical atrial flutter: Secondary | ICD-10-CM

## 2022-11-06 LAB — CBC
HCT: 38.3 % — ABNORMAL LOW (ref 39.0–52.0)
Hemoglobin: 13.2 g/dL (ref 13.0–17.0)
MCH: 32.5 pg (ref 26.0–34.0)
MCHC: 34.5 g/dL (ref 30.0–36.0)
MCV: 94.3 fL (ref 80.0–100.0)
Platelets: 235 10*3/uL (ref 150–400)
RBC: 4.06 MIL/uL — ABNORMAL LOW (ref 4.22–5.81)
RDW: 12.3 % (ref 11.5–15.5)
WBC: 5.2 10*3/uL (ref 4.0–10.5)
nRBC: 0 % (ref 0.0–0.2)

## 2022-11-06 LAB — BASIC METABOLIC PANEL
Anion gap: 8 (ref 5–15)
BUN: 16 mg/dL (ref 8–23)
CO2: 26 mmol/L (ref 22–32)
Calcium: 8.6 mg/dL — ABNORMAL LOW (ref 8.9–10.3)
Chloride: 93 mmol/L — ABNORMAL LOW (ref 98–111)
Creatinine, Ser: 0.91 mg/dL (ref 0.61–1.24)
GFR, Estimated: >60 mL/min (ref >60)
Glucose, Bld: 102 mg/dL — ABNORMAL HIGH (ref 70–99)
Potassium: 4.6 mmol/L (ref 3.5–5.1)
Sodium: 127 mmol/L — ABNORMAL LOW (ref 135–145)

## 2022-11-06 NOTE — Patient Instructions (Signed)
After cardioversion resume irbesartan 75mg  not amlodipine  Cardioversion scheduled for: Wednesday, May 22nd   - Arrive at the [facility] and go to admitting at 730am   - Do not eat or drink anything after midnight the night prior to your procedure.   - Take all your morning medication (except diabetic medications) with a sip of water prior to arrival.  - You will not be able to drive home after your procedure.    - Do NOT miss any doses of your blood thinner - if you should miss a dose please notify our office immediately.   - If you feel as if you go back into normal rhythm prior to scheduled cardioversion, please notify our office immediately.   If your procedure is canceled in the cardioversion suite you will be charged a cancellation fee.

## 2022-11-06 NOTE — Progress Notes (Signed)
Primary Care Physician: Thana Ates, MD Referring Physician: ER f/u Cardiologist: Dr. Jacques Navy Primary EP: Dr Nelly Laurence   Michael Bates is a 82 y.o. male with a h/o SVT episode in 2022, hours after receiving Covid vaccine,  HTN, aflutter/afib just dx in the ER 11/27/21. He was discharged in afib with BB/eliquis on board with a CHA2DS2VASc  score of 3. Echo in the hospital showed normal EF. Monitor at time of SVT in 2022 showed minimum HR 45 bpm and maximum HR 176bpm with average HR 63 bpm.  Predominant underlying rhythm was sinus rhythm.  First-degree AV block was present.  116 supraventricular tachycardia runs occurred, the run with the fastest interval lasting 9 beats with a max rate of 176 bpm, the longest lasting 12.6 seconds with an average rate of 103 bpm.  Ventricular bigeminy and trigeminy were present  He was referred to the afib clinic after recent ER visit with aflutter/afib . Dr. Jacques Navy saw pt on consult in the hospital and he is scheduled to see her in August. He is in rate controlled afib today. He showed me strips on his phone  that showed intermittent SR. He is not overly symptomatic with afib but has noticed some fatigue. He walks 4 miles a day, normal BMI, no tobacco. He drinks 1-2 glasses of wine nightly.  Sleep study in the past did not show any sleep apnea.  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   F/u in afib clinic 12/28/21. Since I last saw him, he had an ER visit for hypertensive  emergency as systolic BP at home over 200 systolic.  In the ER it was 149/98. He has not had any further elevation of BP like that. Since ER, BP has been 120-140's systolic. Recent renal U/S without any  renal artery stenosis.He also had to stop his BB as his heart rate was in the 40's and he felt sluggish. He also reports that he appears to be in SR for the last several weeks and feels improved. He has  an apple watch to help monitor.   F/u in afib clinic, 05/18/22. He   asked to be seen as he has had afib since 11/18, he is rate controlled but is taking 30 mg diltiazem every 4 hours during the day. He is holding  his irbesartan  because of this. His BP is stable. States no missed anticoagulation. He feels fatigued with afib. He states he has had 5 episodes of afib since I last saw him but can usually get back in rhythm with prn dilt.   F/u in afib clinic, 05/25/22. Pt had cardioversion 12/5 but sent a patient message that afternoon that he was out of rhythm.  Today EKG shows SR. His Kardia strips were reviewed and it appears that the strips that were reading afib show SR with some PAC's. He was reassured. No afib seen on Kardia strips. He will be traveling for 2 weeks out of state around [facility]. Precautions re alcohol and afib discussed.   Follow up in the AF clinic 10/19/22. Patient is s/p afib and flutter ablation with Dr Nelly Laurence on 09/19/22. He reports that he has done well since the procedure with only one brief episode of afib. He denies chest pain, swallowing pain, or groin issues. He was having low BP readings at home (90s/50s). He discontinued irbesartan and his BP normalized.   Follow up in the AF clinic 11/06/22. Patient called the clinic 11/02/22  with elevated heart rates and intermittent dizziness. Has been using PRN diltiazem for rate control. There were no specific triggers that he could identify.   Today, he denies symptoms of chest pain, shortness of breath, orthopnea, PND, lower extremity edema, presyncope, syncope, or neurologic sequela. The patient is tolerating medications without difficulties and is otherwise without complaint today.   Past Medical History:  Diagnosis Date   Asthma    Atrial fibrillation (HCC)    BPH (benign prostatic hyperplasia)    Cancer (HCC)    hx of skin cancer    Degenerative arthritis of cervical spine    c5-c6   Dysrhythmia    hx of pvcs in the past    Family history of adverse reaction to anesthesia    Father had  idosyncratic narcotic  reaction   GERD (gastroesophageal reflux disease)    occ    Hyperlipidemia    Hypertension    Hypothyroidism    Osteopenia    Raynaud disease    Renal disease    stage 3, patient not aware of this diagnosis   Thyroid disease    Past Surgical History:  Procedure Laterality Date   A-FLUTTER ABLATION N/A 09/19/2022   Procedure: A-FLUTTER ABLATION;  Surgeon: Mealor, Roberts Gaudy, MD;  Location: [facility];  Service: Cardiovascular;  Laterality: N/A;   ATRIAL FIBRILLATION ABLATION N/A 09/19/2022   Procedure: ATRIAL FIBRILLATION ABLATION;  Surgeon: Maurice Small, MD;  Location: [facility] CV [facility];  Service: Cardiovascular;  Laterality: N/A;   CARDIOVERSION N/A 05/23/2022   Procedure: CARDIOVERSION;  Surgeon: Christell Constant, MD;  Location: [facility] ENDOS[facility];  Service: Cardiovascular;  Laterality: N/A;   COLONOSCOPY WITH PROPOFOL N/A 01/10/2016   Procedure: COLONOSCOPY WITH PROPOFOL;  Surgeon: Charolett Bumpers, MD;  Location: [facility]OSCOPY;  Service: Endoscopy;  Laterality: N/A;   HEMORROIDECTOMY     INGUINAL HERNIA REPAIR Bilateral 12/01/2019   Procedure: LAPAROSCOPIC BILATERAL INGUINAL HERNIA REPAIR WITH MESH,UMBILICAL HERNIA REPAIR;  Surgeon: Sheliah Hatch, De Blanch, MD;  Location: [facility] ORS;  Service: General;  Laterality: Bilateral;   PAROTIDECTOMY  2017   Dr. Jenne Pane   PROSTATECTOMY      Current Outpatient Medications  Medication Sig Dispense Refill   albuterol (PROVENTIL HFA;VENTOLIN HFA) 108 (90 BASE) MCG/ACT inhaler Inhale 2 puffs into the lungs every 6 (six) hours as needed for wheezing or shortness of breath. Inhale 2 puffs every morning, may inhale 2 puffs every 6 hours as needed for shortness of breath     alendronate (FOSAMAX) 70 MG tablet Take 70 mg by mouth every Saturday.     apixaban (ELIQUIS) 5 MG TABS tablet Take 1 tablet (5 mg total) by mouth 2 (two) times daily. 60 tablet 1   Bioflavonoid Products (ESTER C PO) Take 500 mg by mouth in the morning  and at bedtime.     buPROPion (WELLBUTRIN XL) 300 MG 24 hr tablet Take 300 mg by mouth daily.     Cholecalciferol (VITAMIN D3) 5000 units CAPS Take 5,000 Units by mouth in the morning.     CINNAMON PO Take 2 tablets by mouth every evening. Advanced Strength Cinsulin     Coenzyme Q10 300 MG CAPS Take 300 mg by mouth in the morning.     Collagen-Boron-Hyaluronic Acid ([employer] JOINT HEALTH TRIPLE ACTION PO) Take 1 tablet by mouth in the morning.     diltiazem (CARDIZEM) 30 MG tablet Take 1 tablet (30 mg total) by mouth 3 (three) times daily as needed (for palpitations  or atrial fib). 90 tablet 3   hydrALAZINE (APRESOLINE) 25 MG tablet Take 25 mg by mouth 3 (three) times daily as needed (High BP).     ketotifen (ZADITOR) 0.035 % ophthalmic solution Place 1 drop into both eyes daily as needed (allergies).     levothyroxine (SYNTHROID, LEVOTHROID) 200 MCG tablet Take 200 mcg by mouth daily before breakfast.     mirabegron ER (MYRBETRIQ) 50 MG TB24 tablet Take 50 mg by mouth in the morning.     Multiple Vitamins-Minerals (CENTRUM ULTRA MENS PO) Take 1 tablet by mouth daily.      Multiple Vitamins-Minerals (PRESERVISION AREDS 2 PO) Take 1 tablet by mouth in the morning and at bedtime.     Omega-3 Fatty Acids (SALMON OIL PO) Take 500 mg by mouth in the morning and at bedtime.     QVAR REDIHALER 80 MCG/ACT inhaler Inhale 1 puff into the lungs 2 (two) times daily.     rosuvastatin (CRESTOR) 5 MG tablet Take 5 mg by mouth in the morning.     tadalafil (CIALIS) 5 MG tablet Take 5 mg by mouth in the morning.     TURMERIC CURCUMIN PO Take 2,250 mg by mouth every morning.     VYVANSE 30 MG capsule Take 30 mg by mouth every morning.     amLODipine (NORVASC) 2.5 MG tablet Take 2.5 mg by mouth every evening. (Patient not taking: Reported on 11/06/2022)     No current facility-administered medications for this encounter.    Allergies  Allergen Reactions   Tape Other (See Comments)    RED IRRITATION/SKIN PEELS IF  LEFT ON LONG TIME   Zestril [Lisinopril] Cough    Social History   Socioeconomic History   Marital status: Married    Spouse name: Not on file   Number of children: 3   Years of education: 20   Highest education level: Not on file  Occupational History   Not on file  Tobacco Use   Smoking status: Former    Packs/day: 1.00    Years: 20.00    Additional pack years: 0.00    Total pack years: 20.00    Types: Cigarettes, Pipe    Quit date: 13    Years since quitting: 45.4   Smokeless tobacco: Never  Vaping Use   Vaping Use: Never used  Substance and Sexual Activity   Alcohol use: Yes    Alcohol/week: 7.0 standard drinks of alcohol    Types: 7 Glasses of wine per week   Drug use: No   Sexual activity: Yes  Other Topics Concern   Not on file  Social History Narrative   Not on file   Social Determinants of Health   Financial Resource Strain: Not on file  Food Insecurity: Not on file  Transportation Needs: No Transportation Needs (02/26/2019)   PRAPARE - Administrator, Civil Service (Medical): No    Lack of Transportation (Non-Medical): No  Physical Activity: Not on file  Stress: Not on file  Social Connections: Not on file  Intimate Partner Violence: Not At Risk (02/26/2019)   Humiliation, Afraid, Rape, and Kick questionnaire    Fear of Current or Ex-Partner: No    Emotionally Abused: No    Physically Abused: No    Sexually Abused: No    No family history on file.  ROS- All systems are reviewed and negative except as per the HPI above  Physical Exam: Vitals:   11/06/22 1129  BP: 134/74  Pulse: 83  Weight: 70.5 kg  Height: 5' 11.5" (1.816 m)     Wt Readings from Last 3 Encounters:  11/06/22 70.5 kg  10/19/22 69.7 kg  09/19/22 65.8 kg    Labs: Lab Results  Component Value Date   NA 133 (L) 09/04/2022   K 4.5 09/04/2022   CL 93 (L) 09/04/2022   CO2 23 09/04/2022   GLUCOSE 88 09/04/2022   BUN 16 09/04/2022   CREATININE 1.00 09/04/2022    CALCIUM 9.2 09/04/2022   MG 2.2 11/27/2021    GEN- The patient is a well appearing elderly male, alert and oriented x 3 today.   HEENT-head normocephalic, atraumatic, sclera clear, conjunctiva pink, hearing intact, trachea midline. Lungs- Clear to ausculation bilaterally, normal work of breathing Heart- irregular rate and rhythm, no murmurs, rubs or gallops  GI- soft, NT, ND, + BS Extremities- no clubbing, cyanosis, or edema MS- no significant deformity or atrophy Skin- no rash or lesion Psych- euthymic mood, full affect Neuro- strength and sensation are intact   EKG today demonstrates Coarse afib vs atypical atrial flutter Vent. rate 83 BPM PR interval * ms QRS duration 86 ms QT/QTcB 360/423 ms   Echo 11/28/21  1. Left ventricular ejection fraction, by estimation, is 60 to 65%. The  left ventricle has normal function. The left ventricle has no regional  wall motion abnormalities. There is mild left ventricular hypertrophy.  Left ventricular diastolic parameters are indeterminate.   2. Right ventricular systolic function is normal. The right ventricular  size is normal. There is normal pulmonary artery systolic pressure.   3. The mitral valve is grossly normal. Trivial mitral valve  regurgitation.   4. The aortic valve is tricuspid. Aortic valve regurgitation is not  visualized.   5. Aortic no significant ascending aneurysm.   6. The inferior vena cava is normal in size with greater than 50%  respiratory variability, suggesting right atrial pressure of 3 mmHg.   Comparison(s): No prior Echocardiogram.    Epic notes reviewed.  CHA2DS2-VASc Score = 3  The patient's score is based upon: CHF History: 0 HTN History: 1 Diabetes History: 0 Stroke History: 0 Vascular Disease History: 0 Age Score: 2 Gender Score: 0       ASSESSMENT AND PLAN: 1. Persistent Atrial Fibrillation/atrial flutter The patient's CHA2DS2-VASc score is 3, indicating a 3.2% annual risk of  stroke. S/p afib and flutter ablation 09/19/22 He appears to be persistently out of rhythm on his smart watch. We discussed rhythm control options. Will plan for DCCV.  Continue Eliquis 5 mg BID with no missed doses for 3 months post ablation. Continue diltiazem 30 mg q 4 hours PRN for heart racing. Off scheduled BB due to history of bradycardia.   2. Secondary Hypercoagulable State (ICD10:  D68.69) The patient is at significant risk for stroke/thromboembolism based upon his CHA2DS2-VASc Score of 3.  Continue Apixaban (Eliquis).   3. HTN Stable, irbesartan on hold, resume once back in SR.    Follow up with Dr Nelly Laurence as scheduled.    Jorja Loa PA-C Afib Clinic [facility] [address] [place], [place] [address] [phone]

## 2022-11-07 NOTE — Progress Notes (Signed)
Spoke with pt regarding CV in the am. Instructed pt to arrive at 0730 and to not eat or drink anything after 0000. Instructed to take meds in am with a small sip of water.  Confirmed that pt has not missed any doses of eliquis in the past month. Instructed pt to have a ride home and a responsible adult stay with them for 24 hrs after the procedure.

## 2022-11-08 ENCOUNTER — Encounter (HOSPITAL_COMMUNITY)
Admission: RE | Disposition: A | Discharge status: Home / Self Care | Payer: Self-pay | Source: Ambulatory Visit | Attending: Cardiology

## 2022-11-08 ENCOUNTER — Other Ambulatory Visit: Payer: Self-pay

## 2022-11-08 ENCOUNTER — Ambulatory Visit (HOSPITAL_COMMUNITY)
Admission: RE | Admit: 2022-11-08 | Discharge: 2022-11-08 | Disposition: A | Discharge status: Home / Self Care | Payer: Medicare PPO | Source: Ambulatory Visit | Attending: Cardiology | Admitting: Cardiology

## 2022-11-08 ENCOUNTER — Ambulatory Visit (HOSPITAL_COMMUNITY): Payer: Medicare PPO | Admitting: Certified Registered"

## 2022-11-08 ENCOUNTER — Encounter (HOSPITAL_COMMUNITY): Payer: Self-pay | Admitting: Cardiology

## 2022-11-08 ENCOUNTER — Ambulatory Visit (HOSPITAL_BASED_OUTPATIENT_CLINIC_OR_DEPARTMENT_OTHER): Payer: Medicare PPO | Admitting: Certified Registered"

## 2022-11-08 DIAGNOSIS — D6869 Other thrombophilia: Secondary | ICD-10-CM | POA: Diagnosis not present

## 2022-11-08 DIAGNOSIS — I4819 Other persistent atrial fibrillation: Secondary | ICD-10-CM | POA: Insufficient documentation

## 2022-11-08 DIAGNOSIS — I483 Typical atrial flutter: Secondary | ICD-10-CM | POA: Diagnosis not present

## 2022-11-08 DIAGNOSIS — E039 Hypothyroidism, unspecified: Secondary | ICD-10-CM | POA: Diagnosis not present

## 2022-11-08 DIAGNOSIS — J45909 Unspecified asthma, uncomplicated: Secondary | ICD-10-CM

## 2022-11-08 DIAGNOSIS — I4892 Unspecified atrial flutter: Secondary | ICD-10-CM | POA: Diagnosis not present

## 2022-11-08 DIAGNOSIS — I119 Hypertensive heart disease without heart failure: Secondary | ICD-10-CM | POA: Diagnosis not present

## 2022-11-08 DIAGNOSIS — Z79899 Other long term (current) drug therapy: Secondary | ICD-10-CM | POA: Insufficient documentation

## 2022-11-08 DIAGNOSIS — Z7901 Long term (current) use of anticoagulants: Secondary | ICD-10-CM | POA: Insufficient documentation

## 2022-11-08 DIAGNOSIS — I10 Essential (primary) hypertension: Secondary | ICD-10-CM | POA: Insufficient documentation

## 2022-11-08 DIAGNOSIS — I4891 Unspecified atrial fibrillation: Secondary | ICD-10-CM

## 2022-11-08 DIAGNOSIS — Z87891 Personal history of nicotine dependence: Secondary | ICD-10-CM | POA: Diagnosis not present

## 2022-11-08 DIAGNOSIS — I484 Atypical atrial flutter: Secondary | ICD-10-CM

## 2022-11-08 HISTORY — PX: CARDIOVERSION: SHX1299

## 2022-11-08 SURGERY — CARDIOVERSION
Anesthesia: General

## 2022-11-08 MED ORDER — PROPOFOL 10 MG/ML IV BOLUS
INTRAVENOUS | Status: DC | PRN
  Administered 2022-11-08: 40 mg via INTRAVENOUS

## 2022-11-08 MED ORDER — SODIUM CHLORIDE 0.9 % IV SOLN: INTRAVENOUS | Status: DC

## 2022-11-08 SURGICAL SUPPLY — 1 items: ELECT DEFIB PAD ADLT CADENCE (PAD) ×1 IMPLANT

## 2022-11-08 NOTE — Interval H&P Note (Signed)
History and Physical Interval Note:  11/08/2022 7:40 AM  Michael Bates  has presented today for surgery, with the diagnosis of AFIB.  The various methods of treatment have been discussed with the patient and family. After consideration of risks, benefits and other options for treatment, the patient has consented to  Procedure(s): CARDIOVERSION (N/A) as a surgical intervention.  The patient's history has been reviewed, patient examined, no change in status, stable for surgery.  I have reviewed the patient's chart and labs.  Questions were answered to the patient's satisfaction.     [name]

## 2022-11-08 NOTE — Transfer of Care (Signed)
Immediate Anesthesia Transfer of Care Note  Patient: Michael Bates  Procedure(s) Performed: CARDIOVERSION  Patient Location: PACU and Cath Lab  Anesthesia Type:General  Level of Consciousness: drowsy  Airway & Oxygen Therapy: Patient Spontanous Breathing and Patient connected to nasal cannula oxygen  Post-op Assessment: Report given to RN and Post -op Vital signs reviewed and stable  Post vital signs: Reviewed and stable  Last Vitals:  Vitals Value Taken Time  BP 131/85 11/08/22 0815  Temp    Pulse 75 11/08/22 0818  Resp 21 11/08/22 0818  SpO2 100 % 11/08/22 0818  Vitals shown include unvalidated device data.  Last Pain:  Vitals:   11/08/22 0800  TempSrc:   PainSc: 0-No pain         Complications: No notable events documented.

## 2022-11-08 NOTE — Procedures (Signed)
Electrical Cardioversion Procedure Note Michael Bates [id number] 1940-08-04  Procedure: Electrical Cardioversion Indications:  Atrial Fibrillation  Procedure Details Consent: Risks of procedure as well as the alternatives and risks of each were explained to the (patient/caregiver).  Consent for procedure obtained. Time Out: Verified patient identification, verified procedure, site/side was marked, verified correct patient position, special equipment/implants available, medications/allergies/relevent history reviewed, required imaging and test results available.  Performed  Patient placed on cardiac monitor, pulse oximetry, supplemental oxygen as necessary.  Sedation given:  Pt sedated by anesthesia with diprovan 40 mg IV.  Pacer pads placed anterior and posterior chest.  Cardioverted 1 time(s).  Cardioverted at 120J.  Evaluation Findings: Post procedure EKG shows: NSR Complications: None Patient did tolerate procedure well.   Michael Bates 11/08/2022, 7:39 AM

## 2022-11-08 NOTE — H&P (Signed)
ATRIAL FIB OFFICE VISIT 11/06/2022 [place] [facility] at [facility]    [place], [place], [place] [facility] Atypical atrial flutter (HCC) +1 more Dx Referred by Thana Ates, MD Reason for Visit   Additional Documentation  Vitals: BP 134/74   Pulse 83   Ht 5' 11.5" (1.816 m)   Wt 70.5 kg   BMI 21.37 kg/m   BSA 1.89 m  Flowsheets: NEWS,   MEWS Score,   Vital Signs,   Anthropometrics,   Method of Visit  Encounter Info: Billing Info,   History,   Allergies,   Detailed Report   All Notes   Patient Instructions by Shona Simpson, RN at 11/06/2022 11:00 AM  Author: Shona Simpson, RN Author Type: Registered Nurse Filed: 11/06/2022 12:10 PM  Note Status: Signed Cosign: Cosign Not Required Date of Service: 11/06/2022 11:00 AM  Editor: Shona Simpson, RN (Registered Nurse)             After cardioversion resume irbesartan 75mg  not amlodipine   Cardioversion scheduled for: Wednesday, May 22nd               - Arrive at the [facility] and go to admitting at 730am               - Do not eat or drink anything after midnight the night prior to your procedure.               - Take all your morning medication (except diabetic medications) with a sip of water prior to arrival.   - You will not be able to drive home after your procedure.                - Do NOT miss any doses of your blood thinner - if you should miss a dose please notify our office immediately.               - If you feel as if you go back into normal rhythm prior to scheduled cardioversion, please notify our office immediately.    If your procedure is canceled in the cardioversion suite you will be charged a cancellation fee.          Progress Notes by Danice Goltz, PA at 11/06/2022 11:00 AM  Author: Danice Goltz, PA Author Type: Physician Assistant Filed: 11/06/2022 12:02 PM  Note Status: Signed Cosign: Cosign Not Required Date of Service: 11/06/2022  11:00 AM  Editor: Danice Goltz, PA (Physician Assistant)             Expand All Collapse All    Primary Care Physician: Thana Ates, MD Referring Physician: ER f/u Cardiologist: Dr. Jacques Navy Primary EP: Dr Nelly Laurence     [name] is a 82 y.o. male with a h/o SVT episode in 2022, hours after receiving Covid vaccine,  HTN, aflutter/afib just dx in the ER 11/27/21. He was discharged in afib with BB/eliquis on board with a CHA2DS2VASc  score of 3. Echo in the hospital showed normal EF. Monitor at time of SVT in 2022 showed minimum HR 45 bpm and maximum HR 176bpm with average HR 63 bpm.  Predominant underlying rhythm was sinus rhythm.  First-degree AV block was present.  116 supraventricular tachycardia runs occurred, the run with the fastest interval lasting 9 beats with a max rate of 176 bpm, the longest lasting 12.6 seconds with an average rate of 103 bpm.  Ventricular bigeminy  and trigeminy were present   He was referred to the afib clinic after recent ER visit with aflutter/afib . Dr. Jacques Navy saw pt on consult in the hospital and he is scheduled to see her in August. He is in rate controlled afib today. He showed me strips on his phone  that showed intermittent SR. He is not overly symptomatic with afib but has noticed some fatigue. He walks 4 miles a day, normal BMI, no tobacco. He drinks 1-2 glasses of wine nightly.  Sleep study in the past did not show any sleep apnea.  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    F/u in afib clinic 12/28/21. Since I last saw him, he had an ER visit for hypertensive  emergency as systolic BP at home over 200 systolic.  In the ER it was 149/98. He has not had any further elevation of BP like that. Since ER, BP has been 120-140's systolic. Recent renal U/S without any  renal artery stenosis.He also had to stop his BB as his heart rate was in the 40's and he felt sluggish. He also reports that he appears to be in SR for the last several  weeks and feels improved. He has  an apple watch to help monitor.    F/u in afib clinic, 05/18/22. He  asked to be seen as he has had afib since 11/18, he is rate controlled but is taking 30 mg diltiazem every 4 hours during the day. He is holding  his irbesartan  because of this. His BP is stable. States no missed anticoagulation. He feels fatigued with afib. He states he has had 5 episodes of afib since I last saw him but can usually get back in rhythm with prn dilt.    F/u in afib clinic, 05/25/22. Pt had cardioversion 12/5 but sent a patient message that afternoon that he was out of rhythm.  Today EKG shows SR. His Kardia strips were reviewed and it appears that the strips that were reading afib show SR with some PAC's. He was reassured. No afib seen on Kardia strips. He will be traveling for 2 weeks out of state around [facility]. Precautions re alcohol and afib discussed.    Follow up in the AF clinic 10/19/22. Patient is s/p afib and flutter ablation with Dr Nelly Laurence on 09/19/22. He reports that he has done well since the procedure with only one brief episode of afib. He denies chest pain, swallowing pain, or groin issues. He was having low BP readings at home (90s/50s). He discontinued irbesartan and his BP normalized.    Follow up in the AF clinic 11/06/22. Patient called the clinic 11/02/22 with elevated heart rates and intermittent dizziness. Has been using PRN diltiazem for rate control. There were no specific triggers that he could identify.    Today, he denies symptoms of chest pain, shortness of breath, orthopnea, PND, lower extremity edema, presyncope, syncope, or neurologic sequela. The patient is tolerating medications without difficulties and is otherwise without complaint today.        Past Medical History:  Diagnosis Date   Asthma     Atrial fibrillation (HCC)     BPH (benign prostatic hyperplasia)     Cancer (HCC)      hx of skin cancer    Degenerative arthritis of cervical spine       c5-c6   Dysrhythmia      hx of pvcs in the past  Family history of adverse reaction to anesthesia      Father had idosyncratic narcotic  reaction   GERD (gastroesophageal reflux disease)      occ    Hyperlipidemia     Hypertension     Hypothyroidism     Osteopenia     Raynaud disease     Renal disease      stage 3, patient not aware of this diagnosis   Thyroid disease           Past Surgical History:  Procedure Laterality Date   A-FLUTTER ABLATION N/A 09/19/2022    Procedure: A-FLUTTER ABLATION;  Surgeon: Mealor, Roberts Gaudy, MD;  Location: [facility] IN[facility] CV LAB;  Service: Cardiovascular;  Laterality: N/A;   ATRIAL FIBRILLATION ABLATION N/A 09/19/2022    Procedure: ATRIAL FIBRILLATION ABLATION;  Surgeon: Maurice Small, MD;  Location: [facility] INVAS[facility] CV LAB;  Service: Cardiovascular;  Laterality: N/A;   CARDIOVERSION N/A 05/23/2022    Procedure: CARDIOVERSION;  Surgeon: Christell Constant, MD;  Location: [facility] ENDOSCOPY;  Service: Cardiovascular;  Laterality: N/A;   COLONOSCOPY WITH PROPOFOL N/A 01/10/2016    Procedure: COLONOSCOPY WITH PROPOFOL;  Surgeon: Charolett Bumpers, MD;  Location: [facility] ENDOSCOPY;  Service: Endoscopy;  Laterality: N/A;   HEMORROIDECTOMY       INGUINAL HERNIA REPAIR Bilateral 12/01/2019    Procedure: LAPAROSCOPIC BILATERAL INGUINAL HERNIA REPAIR WITH MESH,UMBILICAL HERNIA REPAIR;  Surgeon: Sheliah Hatch, De Blanch, MD;  Location: [facility] ORS;  Service: General;  Laterality: Bilateral;   PAROTIDECTOMY   2017    Dr. Jenne Pane   PROSTATECTOMY                Current Outpatient Medications  Medication Sig Dispense Refill   albuterol (PROVENTIL HFA;VENTOLIN HFA) 108 (90 BASE) MCG/ACT inhaler Inhale 2 puffs into the lungs every 6 (six) hours as needed for wheezing or shortness of breath. Inhale 2 puffs every morning, may inhale 2 puffs every 6 hours as needed for shortness of breath       alendronate (FOSAMAX) 70 MG tablet Take 70 mg by mouth every Saturday.       apixaban  (ELIQUIS) 5 MG TABS tablet Take 1 tablet (5 mg total) by mouth 2 (two) times daily. 60 tablet 1   Bioflavonoid Products (ESTER C PO) Take 500 mg by mouth in the morning and at bedtime.       buPROPion (WELLBUTRIN XL) 300 MG 24 hr tablet Take 300 mg by mouth daily.       Cholecalciferol (VITAMIN D3) 5000 units CAPS Take 5,000 Units by mouth in the morning.       CINNAMON PO Take 2 tablets by mouth every evening. Advanced Strength Cinsulin       Coenzyme Q10 300 MG CAPS Take 300 mg by mouth in the morning.       Collagen-Boron-Hyaluronic Acid ([employer] JOINT HEALTH TRIPLE ACTION PO) Take 1 tablet by mouth in the morning.       diltiazem (CARDIZEM) 30 MG tablet Take 1 tablet (30 mg total) by mouth 3 (three) times daily as needed (for palpitations or atrial fib). 90 tablet 3   hydrALAZINE (APRESOLINE) 25 MG tablet Take 25 mg by mouth 3 (three) times daily as needed (High BP).       ketotifen (ZADITOR) 0.035 % ophthalmic solution Place 1 drop into both eyes daily as needed (allergies).       levothyroxine (SYNTHROID, LEVOTHROID) 200 MCG tablet Take 200 mcg by mouth daily before breakfast.  mirabegron ER (MYRBETRIQ) 50 MG TB24 tablet Take 50 mg by mouth in the morning.       Multiple Vitamins-Minerals (CENTRUM ULTRA MENS PO) Take 1 tablet by mouth daily.        Multiple Vitamins-Minerals (PRESERVISION AREDS 2 PO) Take 1 tablet by mouth in the morning and at bedtime.       Omega-3 Fatty Acids (SALMON OIL PO) Take 500 mg by mouth in the morning and at bedtime.       QVAR REDIHALER 80 MCG/ACT inhaler Inhale 1 puff into the lungs 2 (two) times daily.       rosuvastatin (CRESTOR) 5 MG tablet Take 5 mg by mouth in the morning.       tadalafil (CIALIS) 5 MG tablet Take 5 mg by mouth in the morning.       TURMERIC CURCUMIN PO Take 2,250 mg by mouth every morning.       VYVANSE 30 MG capsule Take 30 mg by mouth every morning.       amLODipine (NORVASC) 2.5 MG tablet Take 2.5 mg by mouth every evening. (Patient  not taking: Reported on 11/06/2022)        No current facility-administered medications for this encounter.           Allergies  Allergen Reactions   Tape Other (See Comments)      RED IRRITATION/SKIN PEELS IF LEFT ON LONG TIME   Zestril [Lisinopril] Cough      Social History         Socioeconomic History   Marital status: Married      Spouse name: Not on file   Number of children: 3   Years of education: 20   Highest education level: Not on file  Occupational History   Not on file  Tobacco Use   Smoking status: Former      Packs/day: 1.00      Years: 20.00      Additional pack years: 0.00      Total pack years: 20.00      Types: Cigarettes, Pipe      Quit date: 1      Years since quitting: 45.4   Smokeless tobacco: Never  Vaping Use   Vaping Use: Never used  Substance and Sexual Activity   Alcohol use: Yes      Alcohol/week: 7.0 standard drinks of alcohol      Types: 7 Glasses of wine per week   Drug use: No   Sexual activity: Yes  Other Topics Concern   Not on file  Social History Narrative   Not on file    Social Determinants of Health        Financial Resource Strain: Not on file  Food Insecurity: Not on file  Transportation Needs: No Transportation Needs (02/26/2019)    PRAPARE - Therapist, art (Medical): No     Lack of Transportation (Non-Medical): No  Physical Activity: Not on file  Stress: Not on file  Social Connections: Not on file  Intimate Partner Violence: Not At Risk (02/26/2019)    Humiliation, Afraid, Rape, and Kick questionnaire     Fear of Current or Ex-Partner: No     Emotionally Abused: No     Physically Abused: No     Sexually Abused: No      No family history on file.   ROS- All systems are reviewed and negative except as per the HPI above   Physical Exam:  Vitals:    11/06/22 1129  BP: 134/74  Pulse: 83  Weight: 70.5 kg  Height: 5' 11.5" (1.816 m)           Wt Readings from Last 3  Encounters:  11/06/22 70.5 kg  10/19/22 69.7 kg  09/19/22 65.8 kg      Labs: Recent Labs       Lab Results  Component Value Date    NA 133 (L) 09/04/2022    K 4.5 09/04/2022    CL 93 (L) 09/04/2022    CO2 23 09/04/2022    GLUCOSE 88 09/04/2022    BUN 16 09/04/2022    CREATININE 1.00 09/04/2022    CALCIUM 9.2 09/04/2022    MG 2.2 11/27/2021        GEN- The patient is a well appearing elderly male, alert and oriented x 3 today.   HEENT-head normocephalic, atraumatic, sclera clear, conjunctiva pink, hearing intact, trachea midline. Lungs- Clear to ausculation bilaterally, normal work of breathing Heart- irregular rate and rhythm, no murmurs, rubs or gallops  GI- soft, NT, ND, + BS Extremities- no clubbing, cyanosis, or edema MS- no significant deformity or atrophy Skin- no rash or lesion Psych- euthymic mood, full affect Neuro- strength and sensation are intact     EKG today demonstrates Coarse afib vs atypical atrial flutter Vent. rate 83 BPM PR interval * ms QRS duration 86 ms QT/QTcB 360/423 ms     Echo 11/28/21  1. Left ventricular ejection fraction, by estimation, is 60 to 65%. The  left ventricle has normal function. The left ventricle has no regional  wall motion abnormalities. There is mild left ventricular hypertrophy.  Left ventricular diastolic parameters are indeterminate.   2. Right ventricular systolic function is normal. The right ventricular  size is normal. There is normal pulmonary artery systolic pressure.   3. The mitral valve is grossly normal. Trivial mitral valve  regurgitation.   4. The aortic valve is tricuspid. Aortic valve regurgitation is not  visualized.   5. Aortic no significant ascending aneurysm.   6. The inferior vena cava is normal in size with greater than 50%  respiratory variability, suggesting right atrial pressure of 3 mmHg.   Comparison(s): No prior Echocardiogram.      Epic notes reviewed.   CHA2DS2-VASc Score = 3   The patient's score is based upon: CHF History: 0 HTN History: 1 Diabetes History: 0 Stroke History: 0 Vascular Disease History: 0 Age Score: 2 Gender Score: 0         ASSESSMENT AND PLAN: 1. Persistent Atrial Fibrillation/atrial flutter The patient's CHA2DS2-VASc score is 3, indicating a 3.2% annual risk of stroke. S/p afib and flutter ablation 09/19/22 He appears to be persistently out of rhythm on his smart watch. We discussed rhythm control options. Will plan for DCCV.  Continue Eliquis 5 mg BID with no missed doses for 3 months post ablation. Continue diltiazem 30 mg q 4 hours PRN for heart racing. Off scheduled BB due to history of bradycardia.    2. Secondary Hypercoagulable State (ICD10:  D68.69) The patient is at significant risk for stroke/thromboembolism based upon his CHA2DS2-VASc Score of 3.  Continue Apixaban (Eliquis).    3. HTN Stable, irbesartan on hold, resume once back in SR.      Follow up with Dr Nelly Laurence as scheduled.      Jorja Loa PA-C Afib Clinic [facility] [address] [place], [place] [address] [phone]  For DCCV; no changes; compliant with apixaban. [name]

## 2022-11-09 ENCOUNTER — Other Ambulatory Visit: Payer: Self-pay | Admitting: Physician Assistant

## 2022-11-09 ENCOUNTER — Ambulatory Visit
Admission: RE | Admit: 2022-11-09 | Discharge: 2022-11-09 | Disposition: A | Discharge status: Home / Self Care | Payer: Medicare PPO | Source: Ambulatory Visit | Attending: Physician Assistant | Admitting: Physician Assistant

## 2022-11-09 DIAGNOSIS — M25431 Effusion, right wrist: Secondary | ICD-10-CM

## 2022-11-09 DIAGNOSIS — M25531 Pain in right wrist: Secondary | ICD-10-CM | POA: Diagnosis not present

## 2022-11-09 DIAGNOSIS — M7989 Other specified soft tissue disorders: Secondary | ICD-10-CM | POA: Diagnosis not present

## 2022-11-09 DIAGNOSIS — M79641 Pain in right hand: Secondary | ICD-10-CM | POA: Diagnosis not present

## 2022-11-09 DIAGNOSIS — E222 Syndrome of inappropriate secretion of antidiuretic hormone: Secondary | ICD-10-CM | POA: Diagnosis not present

## 2022-11-09 DIAGNOSIS — Z9181 History of falling: Secondary | ICD-10-CM | POA: Diagnosis not present

## 2022-11-09 DIAGNOSIS — M109 Gout, unspecified: Secondary | ICD-10-CM | POA: Diagnosis not present

## 2022-11-09 NOTE — Anesthesia Preprocedure Evaluation (Signed)
Anesthesia Evaluation  Patient identified by MRN, date of birth, ID band Patient awake    Reviewed: Allergy & Precautions, NPO status , Patient's Chart, lab work & pertinent test results  History of Anesthesia Complications Negative for: history of anesthetic complications  Airway Mallampati: III  TM Distance: >3 FB Neck ROM: Full    Dental  (+) Dental Advisory Given   Pulmonary asthma , former smoker   breath sounds clear to auscultation       Cardiovascular hypertension, Pt. on medications + dysrhythmias Atrial Fibrillation  Rhythm:Irregular   1. Left ventricular ejection fraction, by estimation, is 60 to 65%. The  left ventricle has normal function. The left ventricle has no regional  wall motion abnormalities. There is mild left ventricular hypertrophy.  Left ventricular diastolic parameters  are indeterminate.   2. Right ventricular systolic function is normal. The right ventricular  size is normal. There is normal pulmonary artery systolic pressure.   3. The mitral valve is grossly normal. Trivial mitral valve  regurgitation.   4. The aortic valve is tricuspid. Aortic valve regurgitation is not  visualized.   5. Aortic no significant ascending aneurysm.   6. The inferior vena cava is normal in size with greater than 50%  respiratory variability, suggesting right atrial pressure of 3 mmHg.      Neuro/Psych    GI/Hepatic ,GERD  ,,  Endo/Other  Hypothyroidism    Renal/GU Renal disease     Musculoskeletal  (+) Arthritis ,    Abdominal   Peds  Hematology  (+) Blood dyscrasia   Anesthesia Other Findings   Reproductive/Obstetrics                              Anesthesia Physical Anesthesia Plan  ASA: 3  Anesthesia Plan: General   Post-op Pain Management:    Induction: Intravenous  PONV Risk Score and Plan: 2 and Treatment may vary due to age or medical condition  Airway  Management Planned: Nasal Cannula and Natural Airway  Additional Equipment: None  Intra-op Plan:   Post-operative Plan:   Informed Consent: I have reviewed the patients History and Physical, chart, labs and discussed the procedure including the risks, benefits and alternatives for the proposed anesthesia with the patient or authorized representative who has indicated his/her understanding and acceptance.     Dental advisory given  Plan Discussed with: CRNA  Anesthesia Plan Comments:          Anesthesia Quick Evaluation

## 2022-11-09 NOTE — Anesthesia Postprocedure Evaluation (Signed)
Anesthesia Post Note  Patient: Michael Bates  Procedure(s) Performed: CARDIOVERSION     Patient location during evaluation: Cath Lab Anesthesia Type: General Level of consciousness: awake and alert Pain management: pain level controlled Vital Signs Assessment: post-procedure vital signs reviewed and stable Respiratory status: spontaneous breathing, nonlabored ventilation, respiratory function stable and patient connected to nasal cannula oxygen Cardiovascular status: stable Postop Assessment: no apparent nausea or vomiting Anesthetic complications: no   No notable events documented.  Last Vitals:  Vitals:   11/08/22 0855 11/08/22 0900  BP: (!) 148/62 103/64  Pulse: 65 (!) 56  Resp: 15 13  Temp:    SpO2: 99% 100%    Last Pain:  Vitals:   11/08/22 0832  TempSrc: Temporal  PainSc: 0-No pain                 Thedford Bunton

## 2022-11-16 DIAGNOSIS — E871 Hypo-osmolality and hyponatremia: Secondary | ICD-10-CM | POA: Diagnosis not present

## 2022-11-17 DIAGNOSIS — L570 Actinic keratosis: Secondary | ICD-10-CM | POA: Diagnosis not present

## 2022-11-17 DIAGNOSIS — L578 Other skin changes due to chronic exposure to nonionizing radiation: Secondary | ICD-10-CM | POA: Diagnosis not present

## 2022-11-17 DIAGNOSIS — D0439 Carcinoma in situ of skin of other parts of face: Secondary | ICD-10-CM | POA: Diagnosis not present

## 2022-11-28 ENCOUNTER — Other Ambulatory Visit: Payer: Self-pay

## 2022-11-28 ENCOUNTER — Encounter (HOSPITAL_COMMUNITY): Payer: Self-pay

## 2022-11-28 ENCOUNTER — Emergency Department (HOSPITAL_COMMUNITY)
Admission: EM | Admit: 2022-11-28 | Discharge: 2022-11-28 | Disposition: A | Discharge status: Home / Self Care | Payer: Medicare PPO | Attending: Emergency Medicine | Admitting: Emergency Medicine

## 2022-11-28 ENCOUNTER — Emergency Department (HOSPITAL_COMMUNITY): Payer: Medicare PPO

## 2022-11-28 ENCOUNTER — Telehealth: Payer: Self-pay | Admitting: Home Health

## 2022-11-28 DIAGNOSIS — Z7901 Long term (current) use of anticoagulants: Secondary | ICD-10-CM | POA: Diagnosis not present

## 2022-11-28 DIAGNOSIS — I4892 Unspecified atrial flutter: Secondary | ICD-10-CM | POA: Insufficient documentation

## 2022-11-28 DIAGNOSIS — I10 Essential (primary) hypertension: Secondary | ICD-10-CM | POA: Insufficient documentation

## 2022-11-28 DIAGNOSIS — E871 Hypo-osmolality and hyponatremia: Secondary | ICD-10-CM | POA: Diagnosis not present

## 2022-11-28 DIAGNOSIS — Z79899 Other long term (current) drug therapy: Secondary | ICD-10-CM | POA: Diagnosis not present

## 2022-11-28 DIAGNOSIS — K449 Diaphragmatic hernia without obstruction or gangrene: Secondary | ICD-10-CM | POA: Diagnosis not present

## 2022-11-28 DIAGNOSIS — R0602 Shortness of breath: Secondary | ICD-10-CM | POA: Diagnosis present

## 2022-11-28 DIAGNOSIS — E039 Hypothyroidism, unspecified: Secondary | ICD-10-CM | POA: Diagnosis not present

## 2022-11-28 DIAGNOSIS — R531 Weakness: Secondary | ICD-10-CM | POA: Diagnosis not present

## 2022-11-28 LAB — CBC WITH DIFFERENTIAL/PLATELET
Abs Immature Granulocytes: 0.04 10*3/uL (ref 0.00–0.07)
Basophils Absolute: 0 10*3/uL (ref 0.0–0.1)
Basophils Relative: 1 %
Eosinophils Absolute: 0.2 10*3/uL (ref 0.0–0.5)
Eosinophils Relative: 3 %
HCT: 39.9 % (ref 39.0–52.0)
Hemoglobin: 13.7 g/dL (ref 13.0–17.0)
Immature Granulocytes: 1 %
Lymphocytes Relative: 13 %
Lymphs Abs: 0.7 10*3/uL (ref 0.7–4.0)
MCH: 32.3 pg (ref 26.0–34.0)
MCHC: 34.3 g/dL (ref 30.0–36.0)
MCV: 94.1 fL (ref 80.0–100.0)
Monocytes Absolute: 0.7 10*3/uL (ref 0.1–1.0)
Monocytes Relative: 13 %
Neutro Abs: 3.9 10*3/uL (ref 1.7–7.7)
Neutrophils Relative %: 69 %
Platelets: 202 10*3/uL (ref 150–400)
RBC: 4.24 MIL/uL (ref 4.22–5.81)
RDW: 12.4 % (ref 11.5–15.5)
WBC: 5.6 10*3/uL (ref 4.0–10.5)
nRBC: 0 % (ref 0.0–0.2)

## 2022-11-28 LAB — COMPREHENSIVE METABOLIC PANEL
ALT: 33 U/L (ref 0–44)
AST: 33 U/L (ref 15–41)
Albumin: 3.8 g/dL (ref 3.5–5.0)
Alkaline Phosphatase: 47 U/L (ref 38–126)
Anion gap: 8 (ref 5–15)
BUN: 18 mg/dL (ref 8–23)
CO2: 26 mmol/L (ref 22–32)
Calcium: 8.2 mg/dL — ABNORMAL LOW (ref 8.9–10.3)
Chloride: 92 mmol/L — ABNORMAL LOW (ref 98–111)
Creatinine, Ser: 0.8 mg/dL (ref 0.61–1.24)
GFR, Estimated: >60 mL/min (ref >60)
Glucose, Bld: 101 mg/dL — ABNORMAL HIGH (ref 70–99)
Potassium: 3.7 mmol/L (ref 3.5–5.1)
Sodium: 126 mmol/L — ABNORMAL LOW (ref 135–145)
Total Bilirubin: 1.1 mg/dL (ref 0.3–1.2)
Total Protein: 5.8 g/dL — ABNORMAL LOW (ref 6.5–8.1)

## 2022-11-28 LAB — MAGNESIUM: Magnesium: 1.9 mg/dL (ref 1.7–2.4)

## 2022-11-28 LAB — TSH: TSH: 1.736 u[IU]/mL (ref 0.350–4.500)

## 2022-11-28 LAB — TROPONIN I (HIGH SENSITIVITY): Troponin I (High Sensitivity): 4 ng/L (ref <18)

## 2022-11-28 NOTE — ED Notes (Signed)
Pt resting in bed with no acute distress noted at this time.  

## 2022-11-28 NOTE — ED Provider Notes (Signed)
[facility] AT [facility] Provider Note   CSN: [id number] Arrival date & time: 11/28/22  1308     History  Chief Complaint  Patient presents with   Bradycardia    Michael Bates is a 82 y.o. male.  Patient is an 82 year old male with a history of A-fib/flutter status post ablation and cardioversion most recently in May on Eliquis, hypertension, hypothyroidism, hyperlipidemia who since his last cardioversion was starting to have some low blood pressures and had stopped his Ibesartan and was only taking 2.5 mg of amlodipine but in the last 2 weeks he has been on prednisone for gout and his blood pressure had gone up so he had started back on the Ibesartan to maintain normal blood pressures.  Patient reports that he had been feeling in his normal state of health and had gone grocery shopping today.  He was bringing in the groceries when he suddenly felt like he was having an episode of A-fib but it was different than what he had had in the past.  He started having shortness of breath like his asthma was acting up, feeling like he was going to pass out, sweaty and felt generally unwell.  He looked at his Apple Watch and heart rate was around 130.  The symptoms lasted for approximately 1 to 2 minutes but then he reports he started feeling worse and noticed that suddenly his heart rate dropped to the 50s.  He does report that normally his heart rate is in the high 50s and low 60s but this just seems very unusual today.  He now reports that he feels better but still feels a little bit off.  He denies any chest pain or pressure at this time and denies having it during the event either.  Other than the above medication changes he has been compliant with all of his medicines.  He denies any recent cough cold or infectious symptoms.  The history is provided by the patient.       Home Medications Prior to Admission medications   Medication Sig Start Date End Date  Taking? Authorizing Provider  fluorouracil (EFUDEX) 5 % cream Apply 1 application  topically 2 (two) times daily. 11/17/22  Yes [provider]  predniSONE (DELTASONE) 10 MG tablet Take by mouth. 11/09/22  Yes [provider]  albuterol (PROVENTIL HFA;VENTOLIN HFA) 108 (90 BASE) MCG/ACT inhaler Inhale 2 puffs into the lungs every 6 (six) hours as needed for wheezing or shortness of breath. Inhale 2 puffs every morning, may inhale 2 puffs every 6 hours as needed for shortness of breath    [provider]  alendronate (FOSAMAX) 70 MG tablet Take 70 mg by mouth every Saturday. 10/23/21   [provider]  amLODipine (NORVASC) 2.5 MG tablet Take 2.5 mg by mouth every evening. 09/20/21   [provider]  apixaban (ELIQUIS) 5 MG TABS tablet Take 1 tablet (5 mg total) by mouth 2 (two) times daily. 11/28/21   Azucena Fallen, MD  Bioflavonoid Products (ESTER C PO) Take 500 mg by mouth in the morning and at bedtime.    [provider]  buPROPion (WELLBUTRIN XL) 300 MG 24 hr tablet Take 300 mg by mouth daily. 11/30/15   [provider]  Cholecalciferol (VITAMIN D3) 5000 units CAPS Take 5,000 Units by mouth in the morning.    [provider]  CINNAMON PO Take 2 tablets by mouth every evening. Advanced Strength Cinsulin    [provider]  Coenzyme Q10 300 MG CAPS Take 300 mg by mouth in the morning.    [provider]  Collagen-Boron-Hyaluronic Acid ([employer] JOINT HEALTH TRIPLE ACTION PO) Take 1 tablet by mouth in the morning.    [provider]  diltiazem (CARDIZEM) 30 MG tablet Take 1 tablet (30 mg total) by mouth 3 (three) times daily as needed (for palpitations or atrial fib). Patient taking differently: Take 30 mg by mouth every 4 (four) hours as needed (for palpitations or atrial fib). Keep heart rate under 100 02/13/22   Parke Poisson, MD  hydrALAZINE (APRESOLINE) 25 MG tablet Take 25 mg by mouth 3 (three) times  daily as needed (High BP).    [provider]  irbesartan (AVAPRO) 75 MG tablet Take 75 mg by mouth daily.    [provider]  ketotifen (ZADITOR) 0.035 % ophthalmic solution Place 1 drop into both eyes daily as needed (allergies).    [provider]  levothyroxine (SYNTHROID, LEVOTHROID) 200 MCG tablet Take 200 mcg by mouth daily before breakfast. 12/30/15   [provider]  mirabegron ER (MYRBETRIQ) 50 MG TB24 tablet Take 50 mg by mouth in the morning.    [provider]  Multiple Vitamins-Minerals (CENTRUM ULTRA MENS PO) Take 1 tablet by mouth daily. 50+    [provider]  Multiple Vitamins-Minerals (PRESERVISION AREDS 2 PO) Take 1 tablet by mouth in the morning and at bedtime.    [provider]  Omega-3 Fatty Acids (SALMON OIL PO) Take 500 mg by mouth in the morning and at bedtime.    [provider]  QVAR REDIHALER 80 MCG/ACT inhaler Inhale 1 puff into the lungs 2 (two) times daily. 10/05/21   [provider]  rosuvastatin (CRESTOR) 5 MG tablet Take 5 mg by mouth in the morning.    [provider]  tadalafil (CIALIS) 5 MG tablet Take 5 mg by mouth in the morning.    [provider]  TURMERIC CURCUMIN PO Take 750 mg by mouth every morning.    [provider]  VYVANSE 30 MG capsule Take 30 mg by mouth every morning.    [provider]      Allergies    Tape and Zestril [lisinopril]    Review of Systems   Review of Systems  Physical Exam Updated Vital Signs BP (!) 167/81   Pulse (!) 56   Temp 98.5 F (36.9 C) (Oral)   Resp 14   Ht 5\' 11"  (1.803 m)   Wt 65.8 kg   SpO2 100%   BMI 20.22 kg/m  Physical Exam Vitals and nursing note reviewed.  Constitutional:      General: He is not in acute distress.    Appearance: He is well-developed.  HENT:     Head: Normocephalic and atraumatic.  Eyes:     Conjunctiva/sclera: Conjunctivae normal.     Pupils: Pupils are equal,  round, and reactive to light.  Cardiovascular:     Rate and Rhythm: Regular rhythm. Bradycardia present.     Heart sounds: No murmur heard. Pulmonary:     Effort: Pulmonary effort is normal. No respiratory distress.     Breath sounds: Normal breath sounds. No wheezing or rales.  Abdominal:     General: There is no distension.     Palpations: Abdomen is soft.     Tenderness: There is no abdominal tenderness. There is no guarding or rebound.  Musculoskeletal:  General: No tenderness. Normal range of motion.     Cervical back: Normal range of motion and neck supple.     Right lower leg: No edema.     Left lower leg: No edema.  Skin:    General: Skin is warm and dry.     Findings: No erythema or rash.  Neurological:     Mental Status: He is alert and oriented to person, place, and time.  Psychiatric:        Behavior: Behavior normal.     ED Results / Procedures / Treatments   Labs (all labs ordered are listed, but only abnormal results are displayed) Labs Reviewed  COMPREHENSIVE METABOLIC PANEL - Abnormal; Notable for the following components:      Result Value   Sodium 126 (*)    Chloride 92 (*)    Glucose, Bld 101 (*)    Calcium 8.2 (*)    Total Protein 5.8 (*)    All other components within normal limits  CBC WITH DIFFERENTIAL/PLATELET  MAGNESIUM  TSH  TROPONIN I (HIGH SENSITIVITY)  TROPONIN I (HIGH SENSITIVITY)    EKG EKG Interpretation  Date/Time:  Tuesday November 28 2022 18:33:19 EDT Ventricular Rate:  58 PR Interval:  191 QRS Duration: 89 QT Interval:  423 QTC Calculation: 416 R Axis:   15 Text Interpretation: Sinus rhythm No significant change since last tracing Confirmed by Gwyneth Sprout ([id number]) on 11/28/2022 8:57:06 PM  Radiology DG Chest Port 1 View  Result Date: 11/28/2022 CLINICAL DATA:  Weakness EXAM: PORTABLE CHEST 1 VIEW COMPARISON:  12/07/2021, and older FINDINGS: Overlapping cardiac leads. Old right healed rib fractures. No  consolidation, pneumothorax or effusion. No edema. Normal cardiopericardial silhouette. Overlapping cardiac leads. Hiatal hernia with double density along the lower mediastinum near midline. IMPRESSION: No acute cardiopulmonary disease. Old right-sided rib fractures. Hiatal hernia. Electronically Signed   By: Karen Kays M.D.   On: 11/28/2022 20:13    Procedures Procedures    Medications Ordered in ED Medications - No data to display  ED Course/ Medical Decision Making/ A&P                             Medical Decision Making Amount and/or Complexity of Data Reviewed Independent Historian: spouse External Data Reviewed: notes. Labs: ordered. Decision-making details documented in ED Course. Radiology: ordered and independent interpretation performed. Decision-making details documented in ED Course. ECG/medicine tests: ordered and independent interpretation performed. Decision-making details documented in ED Course.   Pt with multiple medical problems and comorbidities and presenting today with a complaint that caries a high risk for morbidity and mortality.  Patient here today with an episode of palpitations and then drop in heart rate and feeling generally unwell.  Symptoms are improving now.  Concern for possible episode of A-fib versus ACS versus electrolyte abnormality.  Lower suspicion for PE at this time as patient is anticoagulated.  Patient does drink a few glasses of wine per day but does not drink beer normally.  He was last seen on 20 May for cardioversion.  He has been compliant with his medications however last blood work showed a new hyponatremia with a sodium of 127 where typically his baselines are 130s or higher.  I independently interpreted patient's EKG and labs.  Patient's EKG today shows a sinus bradycardia without acute changes.  CBC without acute findings, troponin is negative, CMP today with a hyponatremia with sodium of 126 but normal renal  function LFTs.  Sodium and  magnesium are normal.    I have independently visualized and interpreted pt's images today.  CXR wnl. Unclear the cause of patient's hyponatremia.  Findings discussed with the patient and his wife.  He did report that he followed up with his PCP after having the sodium level of 127 on 11/06/2022 he had urine osmolality which improved he had decreased the amount of water he was drinking and the PCP reported she was unsure why it had dipped.  Patient does not drink beer he has been on Wellbutrin and has had chronic lower sodiums but did not drop below.  Patient does report he drinks 100 mL of water per day and low suspicion for any dehydration.  Did discuss with patient cutting back on his water consumption and following back up with his PCP.  Do not necessarily feel that that was the cause of patient's symptoms today.  His sodium levels were that prior and he felt fine.  At this time feel that patient is stable for discharge home but will need to follow-up with PCP about the sodium levels.  He will continue his current medications.  He and his wife are comfortable with this plan.  No indication for further testing or admission today.         Final Clinical Impression(s) / ED Diagnoses Final diagnoses:  Paroxysmal atrial flutter (HCC)  Hyponatremia    Rx / DC Orders ED Discharge Orders     None         Gwyneth Sprout, MD 11/28/22 2148

## 2022-11-28 NOTE — ED Triage Notes (Addendum)
Patient is here for evaluation of having an episode of bradycardia. Reports that he first had an episode of tachycardia in the 120's with shortness of breath, nausea and diaphoresis. Then his heart rate went down into the low 50's and had dizziness and sweating. Also reports elevated BP today. Pt states that he feels weak and "foggy headed".

## 2022-11-28 NOTE — Discharge Instructions (Addendum)
Cut back on the amount of water that you are drinking you may want to eat a little more salt with your food.  Continue the current medications you are on for now.  If you start having dizziness, confusion, weakness return to the emergency room as you will need your sodium rechecked.  Everything with your heart looks okay today but you most likely had a short episode of A-fib which caused her symptoms earlier.  If you start getting these more frequently you need to call your cardiologist at the A-fib clinic as you may need to wear a monitor.  If you start having significant chest pain shortness of breath or passing out return to the emergency room.

## 2022-11-28 NOTE — Telephone Encounter (Signed)
Patient called after hour today, c/o feeling nausea, sweating, and not like his usual self. He has samsung watch showing his HR ranges from 50-110s. He noted his HR is slow, normally high 50s and now low 50s. BP is elevated 140 systolic. He also feel lightheaded. He is not on rate slowing meds, has not taken any PRN cardizem, felt this is not his typical A fib symptoms. Advised the patient to go to the nearest ER as he has numerous complaints and in person evaluation is more appopriate. He has agreed to have his family members drive him to ER.

## 2022-12-05 DIAGNOSIS — E871 Hypo-osmolality and hyponatremia: Secondary | ICD-10-CM | POA: Diagnosis not present

## 2022-12-07 ENCOUNTER — Ambulatory Visit (HOSPITAL_COMMUNITY)
Admission: RE | Admit: 2022-12-07 | Discharge: 2022-12-07 | Disposition: A | Discharge status: Home / Self Care | Payer: Medicare PPO | Source: Ambulatory Visit | Attending: Physician Assistant | Admitting: Physician Assistant

## 2022-12-07 VITALS — BP 122/64 | HR 86 | Ht 71.0 in | Wt 152.8 lb

## 2022-12-07 DIAGNOSIS — Z7901 Long term (current) use of anticoagulants: Secondary | ICD-10-CM | POA: Diagnosis not present

## 2022-12-07 DIAGNOSIS — I119 Hypertensive heart disease without heart failure: Secondary | ICD-10-CM | POA: Diagnosis not present

## 2022-12-07 DIAGNOSIS — D6869 Other thrombophilia: Secondary | ICD-10-CM | POA: Diagnosis not present

## 2022-12-07 DIAGNOSIS — I484 Atypical atrial flutter: Secondary | ICD-10-CM | POA: Insufficient documentation

## 2022-12-07 DIAGNOSIS — E785 Hyperlipidemia, unspecified: Secondary | ICD-10-CM | POA: Diagnosis not present

## 2022-12-07 DIAGNOSIS — I4819 Other persistent atrial fibrillation: Secondary | ICD-10-CM | POA: Insufficient documentation

## 2022-12-07 MED ORDER — MULTAQ 400 MG PO TABS: 400.0000 mg | ORAL_TABLET | Freq: Two times a day (BID) | ORAL | 3 refills | Status: DC

## 2022-12-07 NOTE — H&P (View-Only) (Signed)
 Primary Care Physician: Raju, Sneha P, MD Referring Physician: ER f/u Cardiologist: Michael. Acharya Primary EP: Michael Bates   Michael Bates is a 82 y.o. male with a h/o SVT, HTN, HLD, atrial flutter, atrial fibrillation who presents for follow up in the [facility]. Patient is s/Bates afib and flutter ablation with Michael Bates on 09/19/22. He is on Eliquis for a CHA2DS2VASc score of 3. He was seen 10/19/22 and found to be back in afib and underwent DCCV on 11/08/22.  On follow up today, patient reports that he went back out of rhythm early yesterday morning. He has been having symptoms of fatigue and intermittent dizziness. He did present to the ED 11/28/22 with both high and low heart rates. No changes were made at that time, he was in SR.   Today, he denies symptoms of palpitations, chest pain, shortness of breath, orthopnea, PND, lower extremity edema, presyncope, syncope, or neurologic sequela. The patient is tolerating medications without difficulties and is otherwise without complaint today.   Past Medical History:  Diagnosis Date   Asthma    Atrial fibrillation (HCC)    BPH (benign prostatic hyperplasia)    Cancer (HCC)    hx of skin cancer    Degenerative arthritis of cervical spine    c5-c6   Dysrhythmia    hx of pvcs in the past    Family history of adverse reaction to anesthesia    Father had idosyncratic narcotic  reaction   GERD (gastroesophageal reflux disease)    occ    Hyperlipidemia    Hypertension    Hypothyroidism    Osteopenia    Raynaud disease    Renal disease    stage 3, patient not aware of this diagnosis   Thyroid disease     ROS- All systems are reviewed and negative except as per the HPI above  Physical Exam: Vitals:   12/07/22 0902  BP: 122/64  Pulse: 86  Weight: 69.3 kg  Height: 5' 11" (1.803 m)    Wt Readings from Last 3 Encounters:  12/07/22 69.3 kg  11/28/22 65.8 kg  11/08/22 66.7 kg    GEN: Well nourished, well  developed in no acute distress NECK: No JVD; No carotid bruits CARDIAC: Irregularly irregular rate and rhythm, no murmurs, rubs, gallops RESPIRATORY:  Clear to auscultation without rales, wheezing or rhonchi  ABDOMEN: Soft, non-tender, non-distended EXTREMITIES:  No edema; No deformity    EKG today demonstrates Atypical atrial flutter with variable block Vent. rate 86 BPM PR interval * ms QRS duration 80 ms QT/QTcB 342/409 ms   Echo 11/28/21  1. Left ventricular ejection fraction, by estimation, is 60 to 65%. The  left ventricle has normal function. The left ventricle has no regional  wall motion abnormalities. There is mild left ventricular hypertrophy.  Left ventricular diastolic parameters are indeterminate.   2. Right ventricular systolic function is normal. The right ventricular  size is normal. There is normal pulmonary artery systolic pressure.   3. The mitral valve is grossly normal. Trivial mitral valve  regurgitation.   4. The aortic valve is tricuspid. Aortic valve regurgitation is not  visualized.   5. Aortic no significant ascending aneurysm.   6. The inferior vena cava is normal in size with greater than 50%  respiratory variability, suggesting right atrial pressure of 3 mmHg.   Comparison(s): No prior Echocardiogram.    Epic notes reviewed.  CHA2DS2-VASc Score = 3  The patient's score is based   upon: CHF History: 0 HTN History: 1 Diabetes History: 0 Stroke History: 0 Vascular Disease History: 0 Age Score: 2 Gender Score: 0        ASSESSMENT AND PLAN: Persistent Atrial Fibrillation/atrial flutter The patient's CHA2DS2-VASc score is 3, indicating a 3.2% annual risk of stroke. S/Bates afib and flutter ablation 09/19/22 S/Bates DCCV on 11/08/22 with return of atrial flutter.  We discussed rhythm control options today including another DCCV alone or with an AAD. After discussing the risks and benefits, will schedule DCCV and start Multaq 400 mg BID the day prior.  Hopefully, Multaq will be short term as he heals from ablation. Recent cmet reviewed.  Continue Eliquis 5 mg BID with no missed doses for 3 months post ablation. Continue diltiazem 30 mg q 4 hours PRN for heart racing. Off scheduled BB due to history of bradycardia.   Secondary Hypercoagulable State (ICD10:  D68.69) The patient is at significant risk for stroke/thromboembolism based upon his CHA2DS2-VASc Score of 3.  Continue Apixaban (Eliquis).   HTN Stable today. Amlodipine and irbesartan currently on hold due to low BP while in afib.   Informed Consent   Shared Decision Making/Informed Consent The risks (stroke, cardiac arrhythmias rarely resulting in the need for a temporary or permanent pacemaker, skin irritation or burns and complications associated with conscious sedation including aspiration, arrhythmia, respiratory failure and death), benefits (restoration of normal sinus rhythm) and alternatives of a direct current cardioversion were explained in detail to Michael Bates and he agrees to proceed.        Follow up with Michael Bates as scheduled.    Michael Cerys Winget PA-C [facility] [address] [place], [place] [address] [phone]  

## 2022-12-07 NOTE — Patient Instructions (Signed)
Start Multaq 400mg  twice a day (with food) on Tuesday 6/25   Cardioversion scheduled for: Wednesday, June 6/26   - Arrive at the [facility] and go to admitting at 1030am   - Do not eat or drink anything after midnight the night prior to your procedure.   - Take all your morning medication (except diabetic medications) with a sip of water prior to arrival.  - You will not be able to drive home after your procedure.    - Do NOT miss any doses of your blood thinner - if you should miss a dose please notify our office immediately.   - If you feel as if you go back into normal rhythm prior to scheduled cardioversion, please notify our office immediately.   If your procedure is canceled in the cardioversion suite you will be charged a cancellation fee.

## 2022-12-07 NOTE — Progress Notes (Signed)
Primary Care Physician: Thana Ates, MD Referring Physician: ER f/u Cardiologist: Dr. Jacques Navy Primary EP: Dr Nelly Laurence   Michael Bates is a 82 y.o. male with a h/o SVT, HTN, HLD, atrial flutter, atrial fibrillation who presents for follow up in the [facility]. Patient is s/p afib and flutter ablation with Dr Nelly Laurence on 09/19/22. He is on Eliquis for a CHA2DS2VASc score of 3. He was seen 10/19/22 and found to be back in afib and underwent DCCV on 11/08/22.  On follow up today, patient reports that he went back out of rhythm early yesterday morning. He has been having symptoms of fatigue and intermittent dizziness. He did present to the ED 11/28/22 with both high and low heart rates. No changes were made at that time, he was in SR.   Today, he denies symptoms of palpitations, chest pain, shortness of breath, orthopnea, PND, lower extremity edema, presyncope, syncope, or neurologic sequela. The patient is tolerating medications without difficulties and is otherwise without complaint today.   Past Medical History:  Diagnosis Date   Asthma    Atrial fibrillation (HCC)    BPH (benign prostatic hyperplasia)    Cancer (HCC)    hx of skin cancer    Degenerative arthritis of cervical spine    c5-c6   Dysrhythmia    hx of pvcs in the past    Family history of adverse reaction to anesthesia    Father had idosyncratic narcotic  reaction   GERD (gastroesophageal reflux disease)    occ    Hyperlipidemia    Hypertension    Hypothyroidism    Osteopenia    Raynaud disease    Renal disease    stage 3, patient not aware of this diagnosis   Thyroid disease     ROS- All systems are reviewed and negative except as per the HPI above  Physical Exam: Vitals:   12/07/22 0902  BP: 122/64  Pulse: 86  Weight: 69.3 kg  Height: 5\' 11"  (1.803 m)    Wt Readings from Last 3 Encounters:  12/07/22 69.3 kg  11/28/22 65.8 kg  11/08/22 66.7 kg    GEN: Well nourished, well  developed in no acute distress NECK: No JVD; No carotid bruits CARDIAC: Irregularly irregular rate and rhythm, no murmurs, rubs, gallops RESPIRATORY:  Clear to auscultation without rales, wheezing or rhonchi  ABDOMEN: Soft, non-tender, non-distended EXTREMITIES:  No edema; No deformity    EKG today demonstrates Atypical atrial flutter with variable block Vent. rate 86 BPM PR interval * ms QRS duration 80 ms QT/QTcB 342/409 ms   Echo 11/28/21  1. Left ventricular ejection fraction, by estimation, is 60 to 65%. The  left ventricle has normal function. The left ventricle has no regional  wall motion abnormalities. There is mild left ventricular hypertrophy.  Left ventricular diastolic parameters are indeterminate.   2. Right ventricular systolic function is normal. The right ventricular  size is normal. There is normal pulmonary artery systolic pressure.   3. The mitral valve is grossly normal. Trivial mitral valve  regurgitation.   4. The aortic valve is tricuspid. Aortic valve regurgitation is not  visualized.   5. Aortic no significant ascending aneurysm.   6. The inferior vena cava is normal in size with greater than 50%  respiratory variability, suggesting right atrial pressure of 3 mmHg.   Comparison(s): No prior Echocardiogram.    Epic notes reviewed.  CHA2DS2-VASc Score = 3  The patient's score is based  upon: CHF History: 0 HTN History: 1 Diabetes History: 0 Stroke History: 0 Vascular Disease History: 0 Age Score: 2 Gender Score: 0        ASSESSMENT AND PLAN: Persistent Atrial Fibrillation/atrial flutter The patient's CHA2DS2-VASc score is 3, indicating a 3.2% annual risk of stroke. S/p afib and flutter ablation 09/19/22 S/p DCCV on 11/08/22 with return of atrial flutter.  We discussed rhythm control options today including another DCCV alone or with an AAD. After discussing the risks and benefits, will schedule DCCV and start Multaq 400 mg BID the day prior.  Hopefully, Multaq will be short term as he heals from ablation. Recent cmet reviewed.  Continue Eliquis 5 mg BID with no missed doses for 3 months post ablation. Continue diltiazem 30 mg q 4 hours PRN for heart racing. Off scheduled BB due to history of bradycardia.   Secondary Hypercoagulable State (ICD10:  D68.69) The patient is at significant risk for stroke/thromboembolism based upon his CHA2DS2-VASc Score of 3.  Continue Apixaban (Eliquis).   HTN Stable today. Amlodipine and irbesartan currently on hold due to low BP while in afib.   Informed Consent   Shared Decision Making/Informed Consent The risks (stroke, cardiac arrhythmias rarely resulting in the need for a temporary or permanent pacemaker, skin irritation or burns and complications associated with conscious sedation including aspiration, arrhythmia, respiratory failure and death), benefits (restoration of normal sinus rhythm) and alternatives of a direct current cardioversion were explained in detail to [name] and he agrees to proceed.        Follow up with Dr Nelly Laurence as scheduled.    Jorja Loa PA-C [facility] [address] [place], [place] [address] [phone]

## 2022-12-12 ENCOUNTER — Encounter: Payer: Self-pay | Admitting: Cardiovascular Disease

## 2022-12-12 ENCOUNTER — Encounter: Payer: Self-pay | Admitting: Internal Medicine

## 2022-12-12 NOTE — Progress Notes (Signed)
Attempted to call pt but stated he was unloading his groceries and would call back.

## 2022-12-12 NOTE — Progress Notes (Signed)
Spoke to pt and instructed them to come at 1030 and to be NPO after 0000. Confirmed no missed doses of AC and instructed to take in AM with a small sip of water.   Confirmed that pt will have a ride home and someone to stay with them for 24 hours after the procedure. 

## 2022-12-13 ENCOUNTER — Ambulatory Visit (HOSPITAL_BASED_OUTPATIENT_CLINIC_OR_DEPARTMENT_OTHER): Payer: Medicare PPO | Admitting: Anesthesiology

## 2022-12-13 ENCOUNTER — Encounter (HOSPITAL_COMMUNITY)
Admission: RE | Disposition: A | Discharge status: Home / Self Care | Payer: Self-pay | Source: Ambulatory Visit | Attending: Internal Medicine

## 2022-12-13 ENCOUNTER — Ambulatory Visit (HOSPITAL_COMMUNITY)
Admission: RE | Admit: 2022-12-13 | Discharge: 2022-12-13 | Disposition: A | Discharge status: Home / Self Care | Payer: Medicare PPO | Source: Ambulatory Visit | Attending: Internal Medicine | Admitting: Internal Medicine

## 2022-12-13 ENCOUNTER — Other Ambulatory Visit: Payer: Self-pay

## 2022-12-13 ENCOUNTER — Ambulatory Visit (HOSPITAL_COMMUNITY): Payer: Medicare PPO | Admitting: Anesthesiology

## 2022-12-13 DIAGNOSIS — D6869 Other thrombophilia: Secondary | ICD-10-CM | POA: Insufficient documentation

## 2022-12-13 DIAGNOSIS — E785 Hyperlipidemia, unspecified: Secondary | ICD-10-CM | POA: Insufficient documentation

## 2022-12-13 DIAGNOSIS — I4891 Unspecified atrial fibrillation: Secondary | ICD-10-CM

## 2022-12-13 DIAGNOSIS — I4892 Unspecified atrial flutter: Secondary | ICD-10-CM | POA: Diagnosis not present

## 2022-12-13 DIAGNOSIS — I10 Essential (primary) hypertension: Secondary | ICD-10-CM

## 2022-12-13 DIAGNOSIS — Z87891 Personal history of nicotine dependence: Secondary | ICD-10-CM

## 2022-12-13 DIAGNOSIS — Z79899 Other long term (current) drug therapy: Secondary | ICD-10-CM | POA: Insufficient documentation

## 2022-12-13 DIAGNOSIS — E039 Hypothyroidism, unspecified: Secondary | ICD-10-CM

## 2022-12-13 DIAGNOSIS — Z7901 Long term (current) use of anticoagulants: Secondary | ICD-10-CM | POA: Diagnosis not present

## 2022-12-13 DIAGNOSIS — I4819 Other persistent atrial fibrillation: Secondary | ICD-10-CM | POA: Insufficient documentation

## 2022-12-13 HISTORY — PX: CARDIOVERSION: SHX1299

## 2022-12-13 SURGERY — CARDIOVERSION
Anesthesia: General

## 2022-12-13 MED ORDER — PROPOFOL 10 MG/ML IV BOLUS
INTRAVENOUS | Status: DC | PRN
  Administered 2022-12-13: 40 mg via INTRAVENOUS

## 2022-12-13 MED ORDER — SODIUM CHLORIDE 0.9 % IV SOLN: INTRAVENOUS | Status: DC

## 2022-12-13 SURGICAL SUPPLY — 1 items: ELECT DEFIB PAD ADLT CADENCE (PAD) ×1 IMPLANT

## 2022-12-13 NOTE — CV Procedure (Signed)
Procedure: Electrical Cardioversion Indications:  Atrial Fibrillation  Procedure Details:  Consent: Risks of procedure as well as the alternatives and risks of each were explained to the (patient/caregiver).  Consent for procedure obtained.  Time Out: Verified patient identification, verified procedure, site/side was marked, verified correct patient position, special equipment/implants available, medications/allergies/relevent history reviewed, required imaging and test results available. PERFORMED.  Patient placed on cardiac monitor, pulse oximetry, supplemental oxygen as necessary.  Sedation given:  propofol Pacer pads placed anterior and posterior chest.  Cardioverted 1 time(s).  Cardioversion with synchronized biphasic 200J shock.  Evaluation: Findings: Post procedure EKG shows:  sinus bradycardia Complications: None Patient did tolerate procedure well.  Time Spent Directly with the Patient:  10 minutes   [name] 12/13/2022, 12:15 PM

## 2022-12-13 NOTE — Anesthesia Preprocedure Evaluation (Signed)
Anesthesia Evaluation  Patient identified by MRN, date of birth, ID band Patient awake    Reviewed: Allergy & Precautions, NPO status , Patient's Chart, lab work & pertinent test results  History of Anesthesia Complications Negative for: history of anesthetic complications  Airway Mallampati: IV  TM Distance: >3 FB Neck ROM: Full    Dental  (+) Dental Advisory Given   Pulmonary neg shortness of breath, asthma , neg sleep apnea, neg recent URI, former smoker   breath sounds clear to auscultation       Cardiovascular hypertension, Pt. on medications + dysrhythmias Atrial Fibrillation  Rhythm:Irregular   1. Left ventricular ejection fraction, by estimation, is 60 to 65%. The  left ventricle has normal function. The left ventricle has no regional  wall motion abnormalities. There is mild left ventricular hypertrophy.  Left ventricular diastolic parameters  are indeterminate.   2. Right ventricular systolic function is normal. The right ventricular  size is normal. There is normal pulmonary artery systolic pressure.   3. The mitral valve is grossly normal. Trivial mitral valve  regurgitation.   4. The aortic valve is tricuspid. Aortic valve regurgitation is not  visualized.   5. Aortic no significant ascending aneurysm.   6. The inferior vena cava is normal in size with greater than 50%  respiratory variability, suggesting right atrial pressure of 3 mmHg.      Neuro/Psych negative neurological ROS     GI/Hepatic Neg liver ROS,GERD  ,,  Endo/Other  Hypothyroidism    Renal/GU Renal diseaseLab Results      Component                Value               Date                      CREATININE               0.80                11/28/2022                Musculoskeletal  (+) Arthritis ,    Abdominal   Peds  Hematology  (+) Blood dyscrasia eliquis   Anesthesia Other Findings   Reproductive/Obstetrics                              Anesthesia Physical Anesthesia Plan  ASA: 3  Anesthesia Plan: General   Post-op Pain Management:    Induction: Intravenous  PONV Risk Score and Plan: 2 and Treatment may vary due to age or medical condition  Airway Management Planned: Nasal Cannula, Natural Airway and Mask  Additional Equipment: None  Intra-op Plan:   Post-operative Plan:   Informed Consent: I have reviewed the patients History and Physical, chart, labs and discussed the procedure including the risks, benefits and alternatives for the proposed anesthesia with the patient or authorized representative who has indicated his/her understanding and acceptance.     Dental advisory given  Plan Discussed with: CRNA  Anesthesia Plan Comments:         Anesthesia Quick Evaluation

## 2022-12-13 NOTE — Transfer of Care (Signed)
Immediate Anesthesia Transfer of Care Note  Patient: Michael Bates  Procedure(s) Performed: CARDIOVERSION  Patient Location: Cath Lab  Anesthesia Type:General  Level of Consciousness: drowsy and patient cooperative  Airway & Oxygen Therapy: Patient Spontanous Breathing and Patient connected to nasal cannula oxygen  Post-op Assessment: Report given to RN, Post -op Vital signs reviewed and stable, and Patient moving all extremities X 4  Post vital signs: Reviewed and stable  Last Vitals:  Vitals Value Taken Time  BP 139/78 12/13/22 1212  Temp    Pulse 63 12/13/22 1212  Resp 14 12/13/22 1212  SpO2 100 % 12/13/22 1212    Last Pain:  Vitals:   12/13/22 1049  TempSrc: Temporal         Complications: No notable events documented.

## 2022-12-13 NOTE — Interval H&P Note (Signed)
History and Physical Interval Note:  12/13/2022 11:15 AM  Michael Bates  has presented today for surgery, with the diagnosis of AFIB.  The various methods of treatment have been discussed with the patient and family. After consideration of risks, benefits and other options for treatment, the patient has consented to  Procedure(s): CARDIOVERSION (N/A) as a surgical intervention.  The patient's history has been reviewed, patient examined, no change in status, stable for surgery.  I have reviewed the patient's chart and labs.  Questions were answered to the patient's satisfaction.     [name]

## 2022-12-14 ENCOUNTER — Ambulatory Visit (HOSPITAL_COMMUNITY)
Admission: RE | Admit: 2022-12-14 | Discharge: 2022-12-14 | Disposition: A | Discharge status: Home / Self Care | Payer: Medicare PPO | Source: Ambulatory Visit | Attending: Physician Assistant | Admitting: Physician Assistant

## 2022-12-14 ENCOUNTER — Encounter (HOSPITAL_COMMUNITY): Payer: Self-pay | Admitting: Internal Medicine

## 2022-12-14 VITALS — BP 148/82 | HR 67 | Ht 71.0 in | Wt 155.8 lb

## 2022-12-14 DIAGNOSIS — Z79899 Other long term (current) drug therapy: Secondary | ICD-10-CM | POA: Diagnosis not present

## 2022-12-14 DIAGNOSIS — I4892 Unspecified atrial flutter: Secondary | ICD-10-CM | POA: Diagnosis not present

## 2022-12-14 DIAGNOSIS — I4819 Other persistent atrial fibrillation: Secondary | ICD-10-CM | POA: Diagnosis not present

## 2022-12-14 DIAGNOSIS — D6869 Other thrombophilia: Secondary | ICD-10-CM | POA: Diagnosis not present

## 2022-12-14 DIAGNOSIS — K219 Gastro-esophageal reflux disease without esophagitis: Secondary | ICD-10-CM | POA: Insufficient documentation

## 2022-12-14 DIAGNOSIS — E039 Hypothyroidism, unspecified: Secondary | ICD-10-CM | POA: Diagnosis not present

## 2022-12-14 DIAGNOSIS — Z7901 Long term (current) use of anticoagulants: Secondary | ICD-10-CM | POA: Insufficient documentation

## 2022-12-14 DIAGNOSIS — Z85828 Personal history of other malignant neoplasm of skin: Secondary | ICD-10-CM | POA: Diagnosis not present

## 2022-12-14 DIAGNOSIS — E785 Hyperlipidemia, unspecified: Secondary | ICD-10-CM | POA: Insufficient documentation

## 2022-12-14 DIAGNOSIS — I129 Hypertensive chronic kidney disease with stage 1 through stage 4 chronic kidney disease, or unspecified chronic kidney disease: Secondary | ICD-10-CM | POA: Insufficient documentation

## 2022-12-14 DIAGNOSIS — N400 Enlarged prostate without lower urinary tract symptoms: Secondary | ICD-10-CM | POA: Diagnosis not present

## 2022-12-14 DIAGNOSIS — M8580 Other specified disorders of bone density and structure, unspecified site: Secondary | ICD-10-CM | POA: Insufficient documentation

## 2022-12-14 DIAGNOSIS — J45909 Unspecified asthma, uncomplicated: Secondary | ICD-10-CM | POA: Diagnosis not present

## 2022-12-14 LAB — BASIC METABOLIC PANEL
Anion gap: 10 (ref 5–15)
BUN: 15 mg/dL (ref 8–23)
CO2: 28 mmol/L (ref 22–32)
Calcium: 9 mg/dL (ref 8.9–10.3)
Chloride: 100 mmol/L (ref 98–111)
Creatinine, Ser: 1.18 mg/dL (ref 0.61–1.24)
GFR, Estimated: >60 mL/min (ref >60)
Glucose, Bld: 105 mg/dL — ABNORMAL HIGH (ref 70–99)
Potassium: 4.8 mmol/L (ref 3.5–5.1)
Sodium: 138 mmol/L (ref 135–145)

## 2022-12-14 MED ORDER — FUROSEMIDE 20 MG PO TABS: 20.0000 mg | ORAL_TABLET | Freq: Every day | ORAL | 0 refills | Status: DC | PRN

## 2022-12-14 NOTE — Progress Notes (Signed)
Primary Care Physician: Thana Ates, MD Referring Physician: ER f/u Cardiologist: Dr. Jacques Navy Primary EP: Dr Nelly Laurence   Michael Bates is a 82 y.o. male with a h/o SVT, HTN, HLD, atrial flutter, atrial fibrillation who presents for follow up in the [facility]. Patient is s/p afib and flutter ablation with Dr Nelly Laurence on 09/19/22. He is on Eliquis for a CHA2DS2VASc score of 3. He was seen 10/19/22 and found to be back in afib and underwent DCCV on 11/08/22.  On follow up today, patient is s/p DCCV yesterday. He called the clinic this AM with symptoms of SOB, increased lower extremity edema. He is in SR today. Weight stable.   Today, he denies symptoms of palpitations, chest pain, orthopnea, PND, presyncope, syncope, or neurologic sequela. The patient is tolerating medications without difficulties and is otherwise without complaint today.   Past Medical History:  Diagnosis Date   Asthma    Atrial fibrillation (HCC)    BPH (benign prostatic hyperplasia)    Cancer (HCC)    hx of skin cancer    Degenerative arthritis of cervical spine    c5-c6   Dysrhythmia    hx of pvcs in the past    Family history of adverse reaction to anesthesia    Father had idosyncratic narcotic  reaction   GERD (gastroesophageal reflux disease)    occ    Hyperlipidemia    Hypertension    Hypothyroidism    Osteopenia    Raynaud disease    Renal disease    stage 3, patient not aware of this diagnosis   Thyroid disease     ROS- All systems are reviewed and negative except as per the HPI above  Physical Exam: Vitals:   12/14/22 1007  BP: (!) 148/82  Pulse: 67  Weight: 70.7 kg  Height: 5\' 11"  (1.803 m)    Wt Readings from Last 3 Encounters:  12/14/22 70.7 kg  12/13/22 70.3 kg  12/07/22 69.3 kg    GEN: Well nourished, well developed in no acute distress NECK: No JVD; No carotid bruits CARDIAC: Regular rate and rhythm, no murmurs, rubs, gallops RESPIRATORY:  Clear to  auscultation without rales, wheezing or rhonchi  ABDOMEN: Soft, non-tender, non-distended EXTREMITIES:  Trace bilateral edema, No deformity    EKG today demonstrates SR, 1st degree AV block, PAC Vent. rate 67 BPM PR interval 212 ms QRS duration 88 ms QT/QTcB 400/422 ms   Echo 11/28/21  1. Left ventricular ejection fraction, by estimation, is 60 to 65%. The  left ventricle has normal function. The left ventricle has no regional  wall motion abnormalities. There is mild left ventricular hypertrophy.  Left ventricular diastolic parameters are indeterminate.   2. Right ventricular systolic function is normal. The right ventricular  size is normal. There is normal pulmonary artery systolic pressure.   3. The mitral valve is grossly normal. Trivial mitral valve  regurgitation.   4. The aortic valve is tricuspid. Aortic valve regurgitation is not  visualized.   5. Aortic no significant ascending aneurysm.   6. The inferior vena cava is normal in size with greater than 50%  respiratory variability, suggesting right atrial pressure of 3 mmHg.   Comparison(s): No prior Echocardiogram.    Epic notes reviewed.  CHA2DS2-VASc Score = 3  The patient's score is based upon: CHF History: 0 HTN History: 1 Diabetes History: 0 Stroke History: 0 Vascular Disease History: 0 Age Score: 2 Gender Score: 0  ASSESSMENT AND PLAN: Persistent Atrial Fibrillation/atrial flutter The patient's CHA2DS2-VASc score is 3, indicating a 3.2% annual risk of stroke. S/p afib and flutter ablation 09/19/22 S/p DCCV 12/07/22 He is in SR today. Suspect he has some fluid retention post DCCV. Will start lasix 20 mg daily x 3 days. Check bmet.  Continue Multaq 400 mg BID Continue Eliquis 5 mg BID with no missed doses for 3 months post ablation. Continue diltiazem 30 mg q 4 hours PRN for heart racing. Off scheduled BB due to history of bradycardia.   Secondary Hypercoagulable State (ICD10:  D68.69) The patient  is at significant risk for stroke/thromboembolism based upon his CHA2DS2-VASc Score of 3.  Continue Apixaban (Eliquis).   HTN Stable on current regimen Patient monitoring his BP off irbesartan, will restart if BP becomes elevated.    Follow up with Dr Nelly Laurence as scheduled.    Jorja Loa PA-C [facility] [address] [place], [place] [address] [phone]

## 2022-12-14 NOTE — Patient Instructions (Signed)
Lasix (furosemide) 20mg  - take 1 tablet for the next 3 days then only as needed for weight gain/swelling

## 2022-12-15 ENCOUNTER — Telehealth (HOSPITAL_COMMUNITY): Payer: Self-pay | Admitting: *Deleted

## 2022-12-15 NOTE — Telephone Encounter (Signed)
Pt called in this morning stating he has not seen any improvement after lasix yesterday - he is still having shortness of breath and not much increase in urination. Also complaining of headache and BP 150/80. Pt restarted irbesartan this morning. Discussed with Jorja Loa PA - pt does not feel he is tolerating multaq ok to stop continue current instructions regarding lasix PRN for swelling and call Monday with response. Pt in agreement.

## 2022-12-18 ENCOUNTER — Other Ambulatory Visit (HOSPITAL_COMMUNITY): Payer: Self-pay | Admitting: Physician Assistant

## 2022-12-18 ENCOUNTER — Encounter: Payer: Self-pay | Admitting: Cardiovascular Disease

## 2022-12-18 ENCOUNTER — Telehealth (HOSPITAL_COMMUNITY): Payer: Self-pay | Admitting: *Deleted

## 2022-12-18 ENCOUNTER — Ambulatory Visit: Payer: Medicare PPO | Attending: Cardiovascular Disease | Admitting: Cardiovascular Disease

## 2022-12-18 VITALS — BP 126/74 | HR 88 | Ht 71.0 in | Wt 148.8 lb

## 2022-12-18 DIAGNOSIS — I4819 Other persistent atrial fibrillation: Secondary | ICD-10-CM

## 2022-12-18 NOTE — Telephone Encounter (Signed)
Patient called back in afib this morning as of 5am. He felt great yesterday with normal exercise tolerance etc. Today HR 120s - has taken PRN cardizem. Holding irbesartan for BP allowance with PRN cardizem.  Discussed with Jorja Loa PA recommended continuing PRN cardizem for rate control until follow up with Dr. Nelly Laurence next week as scheduled. Pt stated this was not an acceptable plan since he is in afib. Inquired regarding symptoms patient states he feels fatigued and slightly weak in afib. Reached out to Dr. Nelly Laurence RN able to move up appt to today. Pt agreeable.

## 2022-12-18 NOTE — Progress Notes (Signed)
Electrophysiology Office Note:    Date:  12/18/2022   ID:  Michael Bates, DOB 1940-10-02, MRN [id number]  PCP:  Thana Ates, MD   [facility] Providers Cardiologist:  Parke Poisson, MD Electrophysiologist:  Maurice Small, MD     Referring MD: Merlene Laughter, MD   History of Present Illness:    Michael Bates is a 82 y.o. male with a hx listed below, significant for SVT in 2022, atrial fibrillation and flutter referred for arrhythmia management.  Atrial fibrillation and flutter was diagnosed in June 2023 during an ER.  He was discharged with beta-blocker and Eliquis.  EF was normal.    He has been followed in AF clinic since with primarily atrial flutter. He has noted fatigue with episodes in the past.  Recently, his atrial flutter became persistent, and he was sent for cardioversion in December 2023.  He underwent ablation for AF and flutter on September 19, 2022. Unfortunately, his phrenic nerve was directly overlying the right pulmonary veins extending all the way from the superior right vein down past the carina to the inferior vein. I ablated as much as possible but was unable to close the PVI. We were able to induce flutter but it quickly devolved to atrial fibrillation requiring cardioversion. I performed a CTI line.  Since the ablation, he has - not surprisingly -- had recurrence of fibrillation and flutter. He feels reasonably well when his V rates are controlled.  Multaq was started and he underwent DCCV. He returned in sinus rhythm but with shortness of breath. Multaq was discontinued. A flutter recurred today.    EKGs/Labs/Other Studies Reviewed Today:     Monitor: 2022 -- after SVT event. Sibus rhythm. HR 45-176, avg 63. There were runs of SVT with variable rate, longet 12.6 seconds. There was ventricular ectopy, sometimes occurring in bigeminy  EKG:  Last EKG results: today - NSR   Recent Labs: 11/28/2022: ALT 33; Hemoglobin 13.7; Magnesium 1.9;  Platelets 202; TSH 1.736 12/14/2022: BUN 15; Creatinine, Ser 1.18; Potassium 4.8; Sodium 138     Physical Exam:    VS:  BP 126/74   Pulse 88   Ht 5\' 11"  (1.803 m)   Wt 148 lb 12.8 oz (67.5 kg)   SpO2 98%   BMI 20.75 kg/m     Wt Readings from Last 3 Encounters:  12/18/22 148 lb 12.8 oz (67.5 kg)  12/14/22 155 lb 12.8 oz (70.7 kg)  12/13/22 155 lb (70.3 kg)     GEN: Well nourished, well developed in no acute distress CARDIAC: RRR, no murmurs, rubs, gallops RESPIRATORY:  Normal work of breathing MUSCULOSKELETAL: no edema    ASSESSMENT & PLAN:    Atrial fibrillation:  symptomatic. S/p ablation 09/19/2022. Right-side PVI was not completed due to phrenic nerve proximity to the anterior right veins. I recommended repeat ablation with pulsed field, which would allow completion of the right side PVI. Restart multaq Per patient request, will refer to [facility] to see if there is earlier availability for a pulsed field ablation Follow-up 2 weeks  Typical atrial flutter:  S/p CTI ablation Now with possibly atypical atrial flutter  Secondary hypercoagulable state: Chads2Vasc 3. Continue eliquis  Hypertension:        Medication Adjustments/Labs and Tests Ordered: Current medicines are reviewed at length with the patient today.  Concerns regarding medicines are outlined above.  Orders Placed This Encounter  Procedures   EKG 12-Lead   No orders of the defined types  were placed in this encounter.    Signed, Maurice Small, MD  12/18/2022 3:09 PM    [facility]

## 2022-12-18 NOTE — Patient Instructions (Addendum)
Medication Instructions:  Your physician recommends that you continue on your current medications as directed. Please refer to the Current Medication list given to you today. *If you need a refill on your cardiac medications before your next appointment, please call your pharmacy*   Testing/Procedures: Ablation - Wednesday, September 25 Your physician has recommended that you have an ablation. Catheter ablation is a medical procedure used to treat some cardiac arrhythmias (irregular heartbeats). During catheter ablation, a long, thin, flexible tube is put into a blood vessel in your groin (upper thigh), or neck. This tube is called an ablation catheter. It is then guided to your heart through the blood vessel. Radio frequency waves destroy small areas of heart tissue where abnormal heartbeats may cause an arrhythmia to start. Please see the instruction sheet given to you today.   Follow-Up: At [facility], you and your health needs are our priority.  As part of our continuing mission to provide you with exceptional heart care, we have created designated Provider Care Teams.  These Care Teams include your primary Cardiologist (physician) and Advanced Practice Providers (APPs -  Physician Assistants and Nurse Practitioners) who all work together to provide you with the care you need, when you need it.  We recommend signing up for the patient portal called "MyChart".  Sign up information is provided on this After Visit Summary.  MyChart is used to connect with patients for Virtual Visits (Telemedicine).  Patients are able to view lab/test results, encounter notes, upcoming appointments, etc.  Non-urgent messages can be sent to your provider as well.   To learn more about what you can do with MyChart, go to [email or web address].    Your next appointment:   2 weeks  Provider:   York Pellant, MD

## 2022-12-19 NOTE — Anesthesia Postprocedure Evaluation (Signed)
Anesthesia Post Note  Patient: Michael Bates  Procedure(s) Performed: CARDIOVERSION     Patient location during evaluation: Cath Lab Anesthesia Type: General Level of consciousness: awake and alert Pain management: pain level controlled Vital Signs Assessment: post-procedure vital signs reviewed and stable Respiratory status: spontaneous breathing, nonlabored ventilation and respiratory function stable Cardiovascular status: stable Postop Assessment: no apparent nausea or vomiting Anesthetic complications: no   No notable events documented.  Last Vitals:  Vitals:   12/13/22 1225 12/13/22 1230  BP: 129/68 122/74  Pulse: (!) 58 (!) 58  Resp: 14 13  Temp:  36.7 C  SpO2: 96% 96%    Last Pain:  Vitals:   12/13/22 1230  TempSrc: Temporal  PainSc: 0-No pain                 Jerriyah Louis

## 2022-12-20 DIAGNOSIS — E039 Hypothyroidism, unspecified: Secondary | ICD-10-CM | POA: Diagnosis not present

## 2022-12-20 DIAGNOSIS — F900 Attention-deficit hyperactivity disorder, predominantly inattentive type: Secondary | ICD-10-CM | POA: Diagnosis not present

## 2022-12-20 DIAGNOSIS — I10 Essential (primary) hypertension: Secondary | ICD-10-CM | POA: Diagnosis not present

## 2022-12-20 DIAGNOSIS — M8580 Other specified disorders of bone density and structure, unspecified site: Secondary | ICD-10-CM | POA: Diagnosis not present

## 2022-12-20 DIAGNOSIS — I480 Paroxysmal atrial fibrillation: Secondary | ICD-10-CM | POA: Diagnosis not present

## 2022-12-20 DIAGNOSIS — J4520 Mild intermittent asthma, uncomplicated: Secondary | ICD-10-CM | POA: Diagnosis not present

## 2022-12-20 DIAGNOSIS — E7800 Pure hypercholesterolemia, unspecified: Secondary | ICD-10-CM | POA: Diagnosis not present

## 2022-12-20 DIAGNOSIS — M109 Gout, unspecified: Secondary | ICD-10-CM | POA: Diagnosis not present

## 2022-12-20 DIAGNOSIS — Z1331 Encounter for screening for depression: Secondary | ICD-10-CM | POA: Diagnosis not present

## 2022-12-20 DIAGNOSIS — M8589 Other specified disorders of bone density and structure, multiple sites: Secondary | ICD-10-CM | POA: Diagnosis not present

## 2022-12-20 DIAGNOSIS — Z0000 Encounter for general adult medical examination without abnormal findings: Secondary | ICD-10-CM | POA: Diagnosis not present

## 2022-12-20 DIAGNOSIS — R351 Nocturia: Secondary | ICD-10-CM | POA: Diagnosis not present

## 2022-12-27 ENCOUNTER — Ambulatory Visit: Payer: Medicare PPO | Admitting: Cardiovascular Disease

## 2023-01-02 ENCOUNTER — Ambulatory Visit: Payer: Medicare PPO | Attending: Cardiovascular Disease | Admitting: Cardiovascular Disease

## 2023-01-02 ENCOUNTER — Ambulatory Visit: Payer: Medicare PPO | Admitting: Cardiovascular Disease

## 2023-01-02 VITALS — BP 128/84 | HR 90 | Resp 16 | Ht 71.0 in | Wt 155.0 lb

## 2023-01-02 DIAGNOSIS — I484 Atypical atrial flutter: Secondary | ICD-10-CM

## 2023-01-02 MED ORDER — FUROSEMIDE 40 MG PO TABS: ORAL_TABLET | ORAL | 0 refills | Status: DC

## 2023-01-02 NOTE — Progress Notes (Signed)
Electrophysiology Office Note:    Date:  01/02/2023   ID:  Michael Bates, DOB 11-16-1940, MRN [id number]  PCP:  Thana Ates, MD   [facility] Providers Cardiologist:  Parke Poisson, MD Electrophysiologist:  Maurice Small, MD     Referring MD: Thana Ates, MD   History of Present Illness:    Michael Bates is a 82 y.o. male with a hx listed below, significant for SVT in 2022, atrial fibrillation and flutter referred for arrhythmia management.     Atrial fibrillation and flutter was diagnosed in June 2023 during an ER.  He was discharged with beta-blocker and Eliquis.  EF was normal.    He has been followed in AF clinic since with primarily atrial flutter. He has noted fatigue with episodes in the past.  Recently, his atrial flutter became persistent, and he was sent for cardioversion in December 2023.  He underwent ablation for AF and flutter on September 19, 2022. Unfortunately, his phrenic nerve was directly overlying the right pulmonary veins extending all the way from the superior right vein down past the carina to the inferior vein. I ablated as much as possible but was unable to close the PVI. We were able to induce flutter but it quickly devolved to atrial fibrillation requiring cardioversion. I performed a CTI line.  Since the ablation, he has - not surprisingly -- had recurrence of fibrillation and flutter. He feels reasonably well when his V rates are controlled.  Multaq was started and he underwent DCCV. He returned in sinus rhythm but with shortness of breath. Multaq was discontinued. A flutter recurred on the day of our clinic visit, 12/18/2022.     He continues to have some ankle swelling -- this didn't seem significantly changed with the few days of lasix he took after the recent cardioversion. No recurrence of CHF symptoms with resumption of Multaq. He continues to have fatigue, decreased energy.  EKGs/Labs/Other Studies Reviewed Today:      Monitor: 2022 -- after SVT event. Sibus rhythm. HR 45-176, avg 63. There were runs of SVT with variable rate, longet 12.6 seconds. There was ventricular ectopy, sometimes occurring in bigeminy  EKG:  Last EKG results: today - NSR   Recent Labs: 11/28/2022: ALT 33; Hemoglobin 13.7; Magnesium 1.9; Platelets 202; TSH 1.736 12/14/2022: BUN 15; Creatinine, Ser 1.18; Potassium 4.8; Sodium 138     Physical Exam:    VS:  BP 128/84 (BP Location: Left Arm, Patient Position: Sitting, Cuff Size: Normal)   Pulse 90   Resp 16   Ht 5\' 11"  (1.803 m)   Wt 155 lb (70.3 kg)   SpO2 99%   BMI 21.62 kg/m     Wt Readings from Last 3 Encounters:  01/02/23 155 lb (70.3 kg)  12/18/22 148 lb 12.8 oz (67.5 kg)  12/14/22 155 lb 12.8 oz (70.7 kg)     GEN: Well nourished, well developed in no acute distress CARDIAC: RRR, no murmurs, rubs, gallops RESPIRATORY:  Normal work of breathing MUSCULOSKELETAL: no edema    ASSESSMENT & PLAN:    Atrial fibrillation:  symptomatic. S/p ablation 09/19/2022. Right-side PVI was not completed due to phrenic nerve proximity to the anterior right veins. I recommended repeat ablation with pulsed field, which would allow completion of the right side PVI. -- this is scheduled for 9/25 Restart multaq Per patient request, he has an appointment at [facility] in early September Repeat TTE Schedule for repeat cardioversion  Typical atrial flutter:  S/p CTI ablation Now with possibly atypical atrial flutter  Secondary hypercoagulable state:  Chads2Vasc 3.  Continue eliquis 5  Ankle edema: Will Rx lasix 40 mg for 5 days        Medication Adjustments/Labs and Tests Ordered: Current medicines are reviewed at length with the patient today.  Concerns regarding medicines are outlined above.  Orders Placed This Encounter  Procedures   EKG 12-Lead   No orders of the defined types were placed in this encounter.    Signed, Maurice Small, MD  01/02/2023 4:45  PM     [facility]

## 2023-01-02 NOTE — H&P (View-Only) (Signed)
Electrophysiology Office Note:    Date:  01/02/2023   ID:  Michael Bates, DOB 11-16-1940, MRN [id number]  PCP:  Thana Ates, MD   [facility] Providers Cardiologist:  Parke Poisson, MD Electrophysiologist:  Maurice Small, MD     Referring MD: Thana Ates, MD   History of Present Illness:    Michael Bates is a 82 y.o. male with a hx listed below, significant for SVT in 2022, atrial fibrillation and flutter referred for arrhythmia management.     Atrial fibrillation and flutter was diagnosed in June 2023 during an ER.  He was discharged with beta-blocker and Eliquis.  EF was normal.    He has been followed in AF clinic since with primarily atrial flutter. He has noted fatigue with episodes in the past.  Recently, his atrial flutter became persistent, and he was sent for cardioversion in December 2023.  He underwent ablation for AF and flutter on September 19, 2022. Unfortunately, his phrenic nerve was directly overlying the right pulmonary veins extending all the way from the superior right vein down past the carina to the inferior vein. I ablated as much as possible but was unable to close the PVI. We were able to induce flutter but it quickly devolved to atrial fibrillation requiring cardioversion. I performed a CTI line.  Since the ablation, he has - not surprisingly -- had recurrence of fibrillation and flutter. He feels reasonably well when his V rates are controlled.  Multaq was started and he underwent DCCV. He returned in sinus rhythm but with shortness of breath. Multaq was discontinued. A flutter recurred on the day of our clinic visit, 12/18/2022.     He continues to have some ankle swelling -- this didn't seem significantly changed with the few days of lasix he took after the recent cardioversion. No recurrence of CHF symptoms with resumption of Multaq. He continues to have fatigue, decreased energy.  EKGs/Labs/Other Studies Reviewed Today:      Monitor: 2022 -- after SVT event. Sibus rhythm. HR 45-176, avg 63. There were runs of SVT with variable rate, longet 12.6 seconds. There was ventricular ectopy, sometimes occurring in bigeminy  EKG:  Last EKG results: today - NSR   Recent Labs: 11/28/2022: ALT 33; Hemoglobin 13.7; Magnesium 1.9; Platelets 202; TSH 1.736 12/14/2022: BUN 15; Creatinine, Ser 1.18; Potassium 4.8; Sodium 138     Physical Exam:    VS:  BP 128/84 (BP Location: Left Arm, Patient Position: Sitting, Cuff Size: Normal)   Pulse 90   Resp 16   Ht 5\' 11"  (1.803 m)   Wt 155 lb (70.3 kg)   SpO2 99%   BMI 21.62 kg/m     Wt Readings from Last 3 Encounters:  01/02/23 155 lb (70.3 kg)  12/18/22 148 lb 12.8 oz (67.5 kg)  12/14/22 155 lb 12.8 oz (70.7 kg)     GEN: Well nourished, well developed in no acute distress CARDIAC: RRR, no murmurs, rubs, gallops RESPIRATORY:  Normal work of breathing MUSCULOSKELETAL: no edema    ASSESSMENT & PLAN:    Atrial fibrillation:  symptomatic. S/p ablation 09/19/2022. Right-side PVI was not completed due to phrenic nerve proximity to the anterior right veins. I recommended repeat ablation with pulsed field, which would allow completion of the right side PVI. -- this is scheduled for 9/25 Restart multaq Per patient request, he has an appointment at [facility] in early September Repeat TTE Schedule for repeat cardioversion  Typical atrial flutter:  S/p CTI ablation Now with possibly atypical atrial flutter  Secondary hypercoagulable state:  Chads2Vasc 3.  Continue eliquis 5  Ankle edema: Will Rx lasix 40 mg for 5 days        Medication Adjustments/Labs and Tests Ordered: Current medicines are reviewed at length with the patient today.  Concerns regarding medicines are outlined above.  Orders Placed This Encounter  Procedures   EKG 12-Lead   No orders of the defined types were placed in this encounter.    Signed, Maurice Small, MD  01/02/2023 4:45  PM     [facility]

## 2023-01-02 NOTE — Patient Instructions (Signed)
Medication Instructions:  START Furosemide 40 mg once daily for 5 days *If you need a refill on your cardiac medications before your next appointment, please call your pharmacy*   Testing/Procedures: Echocardiogram Your physician has requested that you have an echocardiogram. Echocardiography is a painless test that uses sound waves to create images of your heart. It provides your doctor with information about the size and shape of your heart and how well your heart's chambers and valves are working. This procedure takes approximately one hour. There are no restrictions for this procedure. Please do NOT wear cologne, perfume, aftershave, or lotions (deodorant is allowed). Please arrive 15 minutes prior to your appointment time.  Cardioversion Your physician has recommended that you have a Cardioversion (DCCV). Electrical Cardioversion uses a jolt of electricity to your heart either through paddles or wired patches attached to your chest. This is a controlled, usually prescheduled, procedure. Defibrillation is done under light anesthesia in the hospital, and you usually go home the day of the procedure. This is done to get your heart back into a normal rhythm. You are not awake for the procedure. Please see the instruction sheet given to you today.   Follow-Up: At [facility], you and your health needs are our priority.  As part of our continuing mission to provide you with exceptional heart care, we have created designated Provider Care Teams.  These Care Teams include your primary Cardiologist (physician) and Advanced Practice Providers (APPs -  Physician Assistants and Nurse Practitioners) who all work together to provide you with the care you need, when you need it.  We recommend signing up for the patient portal called "MyChart".  Sign up information is provided on this After Visit Summary.  MyChart is used to connect with patients for Virtual Visits (Telemedicine).  Patients are able  to view lab/test results, encounter notes, upcoming appointments, etc.  Non-urgent messages can be sent to your provider as well.   To learn more about what you can do with MyChart, go to [email or web address].    Your next appointment:   After echocardiogram   Provider:   York Pellant, MD

## 2023-01-03 ENCOUNTER — Ambulatory Visit (HOSPITAL_COMMUNITY): Payer: Medicare PPO | Attending: Cardiovascular Disease

## 2023-01-03 ENCOUNTER — Ambulatory Visit: Payer: Medicare PPO

## 2023-01-03 ENCOUNTER — Telehealth: Payer: Self-pay

## 2023-01-03 DIAGNOSIS — I4819 Other persistent atrial fibrillation: Secondary | ICD-10-CM

## 2023-01-03 DIAGNOSIS — I484 Atypical atrial flutter: Secondary | ICD-10-CM | POA: Diagnosis not present

## 2023-01-03 LAB — ECHOCARDIOGRAM COMPLETE
MV M vel: 4.76 m/s
MV Peak grad: 90.8 mmHg
Radius: 0.4 cm
S' Lateral: 2.3 cm

## 2023-01-03 NOTE — Telephone Encounter (Signed)
Pt is scheduled for DCCV with Dr. Jacques Navy on 01/08/23 at 8:00 AM.   He will have labs done today 01/03/23 at [facility] office.  Instruction letter will be sent via MyChart.

## 2023-01-04 ENCOUNTER — Telehealth: Payer: Self-pay

## 2023-01-04 ENCOUNTER — Encounter (INDEPENDENT_AMBULATORY_CARE_PROVIDER_SITE_OTHER): Payer: Self-pay

## 2023-01-04 ENCOUNTER — Encounter (INDEPENDENT_AMBULATORY_CARE_PROVIDER_SITE_OTHER): Payer: Medicare PPO | Admitting: Ophthalmology

## 2023-01-04 LAB — CBC
Hematocrit: 40 % (ref 37.5–51.0)
Hemoglobin: 13.6 g/dL (ref 13.0–17.7)
MCH: 32.2 pg (ref 26.6–33.0)
MCHC: 34 g/dL (ref 31.5–35.7)
MCV: 95 fL (ref 79–97)
Platelets: 251 10*3/uL (ref 150–450)
RBC: 4.23 x10E6/uL (ref 4.14–5.80)
RDW: 13 % (ref 11.6–15.4)
WBC: 5.5 10*3/uL (ref 3.4–10.8)

## 2023-01-04 LAB — BASIC METABOLIC PANEL
BUN/Creatinine Ratio: 15 (ref 10–24)
BUN: 19 mg/dL (ref 8–27)
CO2: 27 mmol/L (ref 20–29)
Calcium: 8.9 mg/dL (ref 8.6–10.2)
Chloride: 96 mmol/L (ref 96–106)
Creatinine, Ser: 1.26 mg/dL (ref 0.76–1.27)
Glucose: 128 mg/dL — ABNORMAL HIGH (ref 70–99)
Potassium: 4.7 mmol/L (ref 3.5–5.2)
Sodium: 136 mmol/L (ref 134–144)
eGFR: 57 mL/min/{1.73_m2} — ABNORMAL LOW (ref >59)

## 2023-01-04 NOTE — Telephone Encounter (Signed)
Called pt to make him aware that the Endo Lab called and changed the time of his DCCV that is scheduled for 7/22 to 12:30. He is aware of his arrival time being at 11:30 AM.

## 2023-01-05 ENCOUNTER — Encounter: Payer: Self-pay | Admitting: Cardiovascular Disease

## 2023-01-05 ENCOUNTER — Encounter: Payer: Self-pay | Admitting: Internal Medicine

## 2023-01-05 NOTE — Pre-Procedure Instructions (Signed)
Spoke to patient on phone regarding procedure on Monday. Instructed to arrive at 11:15 am, NPO after midnight.  Confirmed patient has a ride home and responsible person to stay with patient for 24 hours after the procedure  Confirmed no missed doses of Eliquis , instructed patient to take the morning of surgery with a sip of water.  Take BP meds in the AM with a sip of water

## 2023-01-08 ENCOUNTER — Ambulatory Visit (HOSPITAL_BASED_OUTPATIENT_CLINIC_OR_DEPARTMENT_OTHER): Payer: Medicare PPO | Admitting: Anesthesiology

## 2023-01-08 ENCOUNTER — Ambulatory Visit (HOSPITAL_COMMUNITY): Payer: Medicare PPO | Admitting: Anesthesiology

## 2023-01-08 ENCOUNTER — Encounter (HOSPITAL_COMMUNITY): Payer: Self-pay | Admitting: Internal Medicine

## 2023-01-08 ENCOUNTER — Encounter (HOSPITAL_COMMUNITY)
Admission: RE | Disposition: A | Discharge status: Home / Self Care | Payer: Self-pay | Source: Ambulatory Visit | Attending: Internal Medicine

## 2023-01-08 ENCOUNTER — Ambulatory Visit (HOSPITAL_COMMUNITY)
Admission: RE | Admit: 2023-01-08 | Discharge: 2023-01-08 | Disposition: A | Discharge status: Home / Self Care | Payer: Medicare PPO | Source: Ambulatory Visit | Attending: Internal Medicine | Admitting: Internal Medicine

## 2023-01-08 ENCOUNTER — Other Ambulatory Visit: Payer: Self-pay

## 2023-01-08 DIAGNOSIS — I483 Typical atrial flutter: Secondary | ICD-10-CM | POA: Diagnosis not present

## 2023-01-08 DIAGNOSIS — I10 Essential (primary) hypertension: Secondary | ICD-10-CM | POA: Diagnosis not present

## 2023-01-08 DIAGNOSIS — Z87891 Personal history of nicotine dependence: Secondary | ICD-10-CM

## 2023-01-08 DIAGNOSIS — I4819 Other persistent atrial fibrillation: Secondary | ICD-10-CM

## 2023-01-08 DIAGNOSIS — I4891 Unspecified atrial fibrillation: Secondary | ICD-10-CM | POA: Insufficient documentation

## 2023-01-08 DIAGNOSIS — D6869 Other thrombophilia: Secondary | ICD-10-CM | POA: Insufficient documentation

## 2023-01-08 DIAGNOSIS — R600 Localized edema: Secondary | ICD-10-CM | POA: Diagnosis not present

## 2023-01-08 DIAGNOSIS — Z7901 Long term (current) use of anticoagulants: Secondary | ICD-10-CM | POA: Insufficient documentation

## 2023-01-08 DIAGNOSIS — E039 Hypothyroidism, unspecified: Secondary | ICD-10-CM | POA: Diagnosis not present

## 2023-01-08 HISTORY — PX: CARDIOVERSION: SHX1299

## 2023-01-08 SURGERY — CARDIOVERSION
Anesthesia: General

## 2023-01-08 MED ORDER — SODIUM CHLORIDE 0.9 % IV SOLN: INTRAVENOUS | Status: DC

## 2023-01-08 MED ORDER — PROPOFOL 10 MG/ML IV BOLUS
INTRAVENOUS | Status: DC | PRN
  Administered 2023-01-08: 50 mg via INTRAVENOUS

## 2023-01-08 MED ORDER — LIDOCAINE 2% (20 MG/ML) 5 ML SYRINGE
INTRAMUSCULAR | Status: DC | PRN
  Administered 2023-01-08: 40 mg via INTRAVENOUS

## 2023-01-08 SURGICAL SUPPLY — 1 items: ELECT DEFIB PAD ADLT CADENCE (PAD) ×1 IMPLANT

## 2023-01-08 NOTE — Transfer of Care (Signed)
Immediate Anesthesia Transfer of Care Note  Patient: Michael Bates  Procedure(s) Performed: CARDIOVERSION  Patient Location: Cath Lab  Anesthesia Type:General  Level of Consciousness: awake, alert , drowsy, and patient cooperative  Airway & Oxygen Therapy: Patient Spontanous Breathing and Patient connected to nasal cannula oxygen  Post-op Assessment: Report given to RN and Post -op Vital signs reviewed and stable  Post vital signs: Reviewed and stable  Last Vitals:  Vitals Value Taken Time  BP 139/80 01/08/23 1236  Temp    Pulse 53 01/08/23 1237  Resp 12 01/08/23 1237  SpO2 97 % 01/08/23 1237  Vitals shown include unfiled device data.  Last Pain:  Vitals:   01/08/23 1149  TempSrc:   PainSc: 0-No pain         Complications: No notable events documented.

## 2023-01-08 NOTE — CV Procedure (Signed)
Procedure: Electrical Cardioversion Indications:  Atrial Fibrillation  Procedure Details:  Consent: Risks of procedure as well as the alternatives and risks of each were explained to the (patient/caregiver).  Consent for procedure obtained.  Time Out: Verified patient identification, verified procedure, site/side was marked, verified correct patient position, special equipment/implants available, medications/allergies/relevent history reviewed, required imaging and test results available. PERFORMED.  Patient placed on cardiac monitor, pulse oximetry, supplemental oxygen as necessary.  Sedation given:  propofol per anesthesia Pacer pads placed anterior and posterior chest.  Cardioverted 1 time(s).  Cardioversion with synchronized biphasic 120J shock.  Evaluation: Findings: Post procedure EKG shows: sinus bradycardia Complications: None Patient did tolerate procedure well. Breathing comfortably at dismissal.  Time Spent Directly with the Patient:  30 minutes   Parke Poisson 01/08/2023, 1:34 PM

## 2023-01-08 NOTE — Interval H&P Note (Signed)
History and Physical Interval Note:  01/08/2023 11:41 AM  Michael Bates  has presented today for surgery, with the diagnosis of AFIB.  The various methods of treatment have been discussed with the patient and family. After consideration of risks, benefits and other options for treatment, the patient has consented to  Procedure(s): CARDIOVERSION (N/A) as a surgical intervention.  The patient's history has been reviewed, patient examined, no change in status, stable for surgery.  I have reviewed the patient's chart and labs.  Questions were answered to the patient's satisfaction.     Parke Poisson

## 2023-01-08 NOTE — Anesthesia Preprocedure Evaluation (Signed)
Anesthesia Evaluation  Patient identified by MRN, date of birth, ID band Patient awake    Reviewed: Allergy & Precautions, NPO status , Patient's Chart, lab work & pertinent test results  History of Anesthesia Complications Negative for: history of anesthetic complications  Airway Mallampati: IV  TM Distance: >3 FB Neck ROM: Full    Dental  (+) Dental Advisory Given   Pulmonary neg shortness of breath, asthma , neg sleep apnea, neg recent URI, former smoker   breath sounds clear to auscultation       Cardiovascular hypertension, Pt. on medications + dysrhythmias Atrial Fibrillation  Rhythm:Irregular   1. Left ventricular ejection fraction, by estimation, is 60 to 65%. The  left ventricle has normal function. The left ventricle has no regional  wall motion abnormalities. There is mild left ventricular hypertrophy.  Left ventricular diastolic parameters  are indeterminate.   2. Right ventricular systolic function is normal. The right ventricular  size is normal. There is normal pulmonary artery systolic pressure.   3. The mitral valve is grossly normal. Trivial mitral valve  regurgitation.   4. The aortic valve is tricuspid. Aortic valve regurgitation is not  visualized.   5. Aortic no significant ascending aneurysm.   6. The inferior vena cava is normal in size with greater than 50%  respiratory variability, suggesting right atrial pressure of 3 mmHg.      Neuro/Psych negative neurological ROS     GI/Hepatic Neg liver ROS,GERD  ,,  Endo/Other  Hypothyroidism    Renal/GU Renal diseaseLab Results      Component                Value               Date                      CREATININE               0.80                11/28/2022                Musculoskeletal  (+) Arthritis ,    Abdominal   Peds  Hematology  (+) Blood dyscrasia eliquis   Anesthesia Other Findings   Reproductive/Obstetrics                              Anesthesia Physical Anesthesia Plan  ASA: 3  Anesthesia Plan: General   Post-op Pain Management:    Induction: Intravenous  PONV Risk Score and Plan: 2 and Treatment may vary due to age or medical condition  Airway Management Planned: Nasal Cannula, Natural Airway and Mask  Additional Equipment: None  Intra-op Plan:   Post-operative Plan:   Informed Consent: I have reviewed the patients History and Physical, chart, labs and discussed the procedure including the risks, benefits and alternatives for the proposed anesthesia with the patient or authorized representative who has indicated his/her understanding and acceptance.     Dental advisory given  Plan Discussed with: CRNA  Anesthesia Plan Comments:         Anesthesia Quick Evaluation

## 2023-01-08 NOTE — Anesthesia Postprocedure Evaluation (Signed)
Anesthesia Post Note  Patient: Michael Bates  Procedure(s) Performed: CARDIOVERSION     Patient location during evaluation: Cath Lab Anesthesia Type: General Level of consciousness: awake and alert Pain management: pain level controlled Vital Signs Assessment: post-procedure vital signs reviewed and stable Respiratory status: spontaneous breathing, nonlabored ventilation and respiratory function stable Cardiovascular status: blood pressure returned to baseline and stable Postop Assessment: no apparent nausea or vomiting Anesthetic complications: no   No notable events documented.  Last Vitals:  Vitals:   01/08/23 1255 01/08/23 1300  BP: 98/86 129/69  Pulse: (!) 51 (!) 53  Resp: 17 14  Temp:  36.7 C  SpO2: 100% 100%    Last Pain:  Vitals:   01/08/23 1300  TempSrc: Temporal  PainSc: 0-No pain                 Lilou Kneip

## 2023-01-08 NOTE — Progress Notes (Signed)
Pt stable and ready for discharge, awaiting DC instructions.

## 2023-01-08 NOTE — Addendum Note (Signed)
Addendum  created 01/08/23 1711 by Val Eagle, MD   Attestation recorded in Intraprocedure, Intraprocedure Attestations filed

## 2023-01-09 ENCOUNTER — Other Ambulatory Visit: Payer: Self-pay | Admitting: Cardiovascular Disease

## 2023-01-09 ENCOUNTER — Encounter (HOSPITAL_COMMUNITY): Payer: Self-pay | Admitting: Internal Medicine

## 2023-01-09 MED ORDER — FUROSEMIDE 40 MG PO TABS: ORAL_TABLET | ORAL | 0 refills | Status: DC

## 2023-01-09 NOTE — Telephone Encounter (Signed)
Patient reports having some shortness of breath after cardioversion yesterday. He denies any chest pain. Does report weight gain of 4 lbs. Dr. Nelly Laurence recently gave him 5 tablets of lasix for ankle edema. He reports he had similar symptoms of shortness of breath after his previous cardioversion and lasix did help.

## 2023-01-11 ENCOUNTER — Telehealth: Payer: Self-pay | Admitting: Cardiology

## 2023-01-11 DIAGNOSIS — H353132 Nonexudative age-related macular degeneration, bilateral, intermediate dry stage: Secondary | ICD-10-CM | POA: Diagnosis not present

## 2023-01-11 DIAGNOSIS — Z961 Presence of intraocular lens: Secondary | ICD-10-CM | POA: Diagnosis not present

## 2023-01-11 NOTE — Telephone Encounter (Signed)
Patient called in reporting his blood pressures have been elevated the past couple of readings. Last reading was 173/105, he has already taken his avapro 75mg  daily, norvasc 2.5mg  daily and hydralazine 25mg  (actually thinks this is expired). Last BP reading 150/82. I advised to increase the norvasc to 5mg  daily (take extra 2.5mg  x1 now) and follow blood pressures. He voiced understanding and thanked me for callback.

## 2023-01-25 ENCOUNTER — Telehealth: Payer: Self-pay

## 2023-01-25 NOTE — Telephone Encounter (Signed)
Called pt to see if he would like to keep his scheduled date of 9/25 for his AF Ablation with Dr. Nelly Laurence. He had stated previously that he would like to check with [facility] to see if he could get a sooner procedure date. At this time, he has not been able to get a sooner date and would like to keep the one on 9/25 with Dr. Nelly Laurence. He also wants to make sure he will be able to do the pulse field ablation. I informed him that it has not been implemented yet but that I would keep in touch with him regarding that.

## 2023-01-31 DIAGNOSIS — D229 Melanocytic nevi, unspecified: Secondary | ICD-10-CM | POA: Diagnosis not present

## 2023-01-31 DIAGNOSIS — C44629 Squamous cell carcinoma of skin of left upper limb, including shoulder: Secondary | ICD-10-CM | POA: Diagnosis not present

## 2023-01-31 DIAGNOSIS — L821 Other seborrheic keratosis: Secondary | ICD-10-CM | POA: Diagnosis not present

## 2023-01-31 DIAGNOSIS — Z85828 Personal history of other malignant neoplasm of skin: Secondary | ICD-10-CM | POA: Diagnosis not present

## 2023-01-31 DIAGNOSIS — Z08 Encounter for follow-up examination after completed treatment for malignant neoplasm: Secondary | ICD-10-CM | POA: Diagnosis not present

## 2023-01-31 DIAGNOSIS — C44622 Squamous cell carcinoma of skin of right upper limb, including shoulder: Secondary | ICD-10-CM | POA: Diagnosis not present

## 2023-01-31 DIAGNOSIS — C44222 Squamous cell carcinoma of skin of right ear and external auricular canal: Secondary | ICD-10-CM | POA: Diagnosis not present

## 2023-01-31 DIAGNOSIS — D485 Neoplasm of uncertain behavior of skin: Secondary | ICD-10-CM | POA: Diagnosis not present

## 2023-01-31 DIAGNOSIS — R229 Localized swelling, mass and lump, unspecified: Secondary | ICD-10-CM | POA: Diagnosis not present

## 2023-01-31 DIAGNOSIS — C44329 Squamous cell carcinoma of skin of other parts of face: Secondary | ICD-10-CM | POA: Diagnosis not present

## 2023-02-06 ENCOUNTER — Telehealth: Payer: Self-pay

## 2023-02-06 DIAGNOSIS — I4891 Unspecified atrial fibrillation: Secondary | ICD-10-CM

## 2023-02-06 NOTE — Telephone Encounter (Signed)
I called pt and made him aware that our EP Dr's are now doing the Pulse Field Ablations - He would like to proceed with his procedure that is scheduled on 03/14/23.Marland KitchenMarland Kitchen

## 2023-02-07 NOTE — Addendum Note (Signed)
Addended by: Cleda Mccreedy on: 02/07/2023 12:05 PM   Modules accepted: Orders

## 2023-02-07 NOTE — Telephone Encounter (Signed)
Pt aware that Dr. Nelly Laurence will require him to have another CT prior to this Ablation. I have put in order for that.   Instruction letters will be sent via MyChart per pt's request.

## 2023-02-15 DIAGNOSIS — D0439 Carcinoma in situ of skin of other parts of face: Secondary | ICD-10-CM | POA: Diagnosis not present

## 2023-02-16 ENCOUNTER — Encounter: Payer: Self-pay | Admitting: Cardiovascular Disease

## 2023-02-16 ENCOUNTER — Encounter: Payer: Self-pay | Admitting: Internal Medicine

## 2023-02-20 ENCOUNTER — Ambulatory Visit: Payer: Medicare PPO | Attending: Cardiovascular Disease

## 2023-02-20 DIAGNOSIS — I4891 Unspecified atrial fibrillation: Secondary | ICD-10-CM

## 2023-02-21 LAB — BASIC METABOLIC PANEL
BUN/Creatinine Ratio: 17 (ref 10–24)
BUN: 19 mg/dL (ref 8–27)
CO2: 22 mmol/L (ref 20–29)
Calcium: 9.1 mg/dL (ref 8.6–10.2)
Chloride: 96 mmol/L (ref 96–106)
Creatinine, Ser: 1.11 mg/dL (ref 0.76–1.27)
Glucose: 100 mg/dL — ABNORMAL HIGH (ref 70–99)
Potassium: 4.4 mmol/L (ref 3.5–5.2)
Sodium: 135 mmol/L (ref 134–144)
eGFR: 66 mL/min/{1.73_m2} (ref >59)

## 2023-02-21 LAB — CBC
Hematocrit: 41.9 % (ref 37.5–51.0)
Hemoglobin: 14 g/dL (ref 13.0–17.7)
MCH: 32.9 pg (ref 26.6–33.0)
MCHC: 33.4 g/dL (ref 31.5–35.7)
MCV: 98 fL — ABNORMAL HIGH (ref 79–97)
Platelets: 252 10*3/uL (ref 150–450)
RBC: 4.26 x10E6/uL (ref 4.14–5.80)
RDW: 12.5 % (ref 11.6–15.4)
WBC: 4.9 10*3/uL (ref 3.4–10.8)

## 2023-02-21 NOTE — Telephone Encounter (Signed)
Spoke with patient, made aware of Dr Morrie Sheldon recommendation. No further questions at this time  Mealor, Roberts Gaudy, MD  You; Cv Div [facility] [address] [place] hours ago (5:02 PM)   I would think it better to get vaccinated sooner rather than later.

## 2023-02-22 ENCOUNTER — Ambulatory Visit: Payer: Medicare PPO | Admitting: Internal Medicine

## 2023-02-22 DIAGNOSIS — Z79899 Other long term (current) drug therapy: Secondary | ICD-10-CM | POA: Diagnosis not present

## 2023-02-22 DIAGNOSIS — I10 Essential (primary) hypertension: Secondary | ICD-10-CM | POA: Diagnosis not present

## 2023-02-22 DIAGNOSIS — Z5181 Encounter for therapeutic drug level monitoring: Secondary | ICD-10-CM | POA: Diagnosis not present

## 2023-02-22 DIAGNOSIS — Z7901 Long term (current) use of anticoagulants: Secondary | ICD-10-CM | POA: Diagnosis not present

## 2023-02-22 DIAGNOSIS — I484 Atypical atrial flutter: Secondary | ICD-10-CM | POA: Diagnosis not present

## 2023-02-22 DIAGNOSIS — I340 Nonrheumatic mitral (valve) insufficiency: Secondary | ICD-10-CM | POA: Diagnosis not present

## 2023-02-22 DIAGNOSIS — I4819 Other persistent atrial fibrillation: Secondary | ICD-10-CM | POA: Diagnosis not present

## 2023-02-27 DIAGNOSIS — D0421 Carcinoma in situ of skin of right ear and external auricular canal: Secondary | ICD-10-CM | POA: Diagnosis not present

## 2023-03-05 ENCOUNTER — Ambulatory Visit (HOSPITAL_COMMUNITY)
Admission: RE | Admit: 2023-03-05 | Discharge: 2023-03-05 | Disposition: A | Discharge status: Home / Self Care | Payer: Medicare PPO | Source: Ambulatory Visit | Attending: Cardiovascular Disease | Admitting: Cardiovascular Disease

## 2023-03-05 DIAGNOSIS — I4891 Unspecified atrial fibrillation: Secondary | ICD-10-CM

## 2023-03-05 MED ORDER — IOHEXOL 350 MG/ML SOLN
80.0000 mL | Freq: Once | INTRAVENOUS | Status: AC | PRN
  Administered 2023-03-05: 80 mL via INTRAVENOUS

## 2023-03-08 ENCOUNTER — Telehealth: Payer: Self-pay | Admitting: Cardiovascular Disease

## 2023-03-08 DIAGNOSIS — S01301A Unspecified open wound of right ear, initial encounter: Secondary | ICD-10-CM | POA: Diagnosis not present

## 2023-03-08 DIAGNOSIS — D0461 Carcinoma in situ of skin of right upper limb, including shoulder: Secondary | ICD-10-CM | POA: Diagnosis not present

## 2023-03-08 NOTE — Telephone Encounter (Signed)
Spoke with patient, having ablation done at [facility] and wants to cancel ablation with [facility]. Cancelled at patient request.

## 2023-03-08 NOTE — Telephone Encounter (Signed)
Pt wants to know if he can cancel his appt on 9/25. Please advise

## 2023-03-14 ENCOUNTER — Ambulatory Visit (HOSPITAL_COMMUNITY): Admission: RE | Admit: 2023-03-14 | Payer: Medicare PPO | Source: Ambulatory Visit | Admitting: Cardiovascular Disease

## 2023-03-14 ENCOUNTER — Encounter (HOSPITAL_COMMUNITY): Admission: RE | Payer: Self-pay | Source: Ambulatory Visit

## 2023-03-14 DIAGNOSIS — I484 Atypical atrial flutter: Secondary | ICD-10-CM | POA: Diagnosis not present

## 2023-03-14 DIAGNOSIS — K449 Diaphragmatic hernia without obstruction or gangrene: Secondary | ICD-10-CM | POA: Diagnosis not present

## 2023-03-14 DIAGNOSIS — Z87891 Personal history of nicotine dependence: Secondary | ICD-10-CM | POA: Diagnosis not present

## 2023-03-14 DIAGNOSIS — Z01810 Encounter for preprocedural cardiovascular examination: Secondary | ICD-10-CM | POA: Diagnosis not present

## 2023-03-14 DIAGNOSIS — I4819 Other persistent atrial fibrillation: Secondary | ICD-10-CM | POA: Diagnosis not present

## 2023-03-14 DIAGNOSIS — G4733 Obstructive sleep apnea (adult) (pediatric): Secondary | ICD-10-CM | POA: Diagnosis not present

## 2023-03-14 SURGERY — ATRIAL FIBRILLATION ABLATION
Anesthesia: General

## 2023-03-20 DIAGNOSIS — I10 Essential (primary) hypertension: Secondary | ICD-10-CM | POA: Diagnosis not present

## 2023-03-20 DIAGNOSIS — E7800 Pure hypercholesterolemia, unspecified: Secondary | ICD-10-CM | POA: Diagnosis not present

## 2023-03-20 DIAGNOSIS — I498 Other specified cardiac arrhythmias: Secondary | ICD-10-CM | POA: Diagnosis not present

## 2023-03-20 DIAGNOSIS — I440 Atrioventricular block, first degree: Secondary | ICD-10-CM | POA: Diagnosis not present

## 2023-03-20 DIAGNOSIS — E039 Hypothyroidism, unspecified: Secondary | ICD-10-CM | POA: Diagnosis not present

## 2023-03-20 DIAGNOSIS — I4819 Other persistent atrial fibrillation: Secondary | ICD-10-CM | POA: Diagnosis not present

## 2023-03-20 DIAGNOSIS — I484 Atypical atrial flutter: Secondary | ICD-10-CM | POA: Diagnosis not present

## 2023-03-20 DIAGNOSIS — I483 Typical atrial flutter: Secondary | ICD-10-CM | POA: Diagnosis not present

## 2023-03-20 DIAGNOSIS — J45909 Unspecified asthma, uncomplicated: Secondary | ICD-10-CM | POA: Diagnosis not present

## 2023-03-20 DIAGNOSIS — K449 Diaphragmatic hernia without obstruction or gangrene: Secondary | ICD-10-CM | POA: Diagnosis not present

## 2023-03-26 DIAGNOSIS — R3915 Urgency of urination: Secondary | ICD-10-CM | POA: Diagnosis not present

## 2023-03-26 DIAGNOSIS — R338 Other retention of urine: Secondary | ICD-10-CM | POA: Diagnosis not present

## 2023-03-26 DIAGNOSIS — N398 Other specified disorders of urinary system: Secondary | ICD-10-CM | POA: Diagnosis not present

## 2023-03-26 DIAGNOSIS — N401 Enlarged prostate with lower urinary tract symptoms: Secondary | ICD-10-CM | POA: Diagnosis not present

## 2023-03-29 DIAGNOSIS — Z961 Presence of intraocular lens: Secondary | ICD-10-CM | POA: Diagnosis not present

## 2023-03-29 DIAGNOSIS — H40013 Open angle with borderline findings, low risk, bilateral: Secondary | ICD-10-CM | POA: Diagnosis not present

## 2023-03-29 DIAGNOSIS — H353131 Nonexudative age-related macular degeneration, bilateral, early dry stage: Secondary | ICD-10-CM | POA: Diagnosis not present

## 2023-03-29 DIAGNOSIS — H0102B Squamous blepharitis left eye, upper and lower eyelids: Secondary | ICD-10-CM | POA: Diagnosis not present

## 2023-03-29 DIAGNOSIS — H10413 Chronic giant papillary conjunctivitis, bilateral: Secondary | ICD-10-CM | POA: Diagnosis not present

## 2023-03-29 DIAGNOSIS — H0102A Squamous blepharitis right eye, upper and lower eyelids: Secondary | ICD-10-CM | POA: Diagnosis not present

## 2023-04-13 DIAGNOSIS — Z79899 Other long term (current) drug therapy: Secondary | ICD-10-CM | POA: Diagnosis not present

## 2023-04-18 DIAGNOSIS — I10 Essential (primary) hypertension: Secondary | ICD-10-CM | POA: Diagnosis not present

## 2023-04-23 ENCOUNTER — Encounter: Payer: Self-pay | Admitting: Internal Medicine

## 2023-04-23 ENCOUNTER — Ambulatory Visit: Payer: Medicare PPO | Attending: Internal Medicine | Admitting: Internal Medicine

## 2023-04-23 VITALS — BP 118/60 | HR 61 | Ht 71.0 in | Wt 147.8 lb

## 2023-04-23 DIAGNOSIS — I484 Atypical atrial flutter: Secondary | ICD-10-CM

## 2023-04-23 DIAGNOSIS — D6869 Other thrombophilia: Secondary | ICD-10-CM

## 2023-04-23 DIAGNOSIS — I10 Essential (primary) hypertension: Secondary | ICD-10-CM

## 2023-04-23 DIAGNOSIS — E785 Hyperlipidemia, unspecified: Secondary | ICD-10-CM | POA: Diagnosis not present

## 2023-04-23 DIAGNOSIS — I2510 Atherosclerotic heart disease of native coronary artery without angina pectoris: Secondary | ICD-10-CM

## 2023-04-23 NOTE — Progress Notes (Signed)
Cardiology Office Note:  .   Date:  04/23/2023  ID:  Michael Bates, DOB [birth year], MRN [id number] PCP: Thana Ates, MD  [facility] Providers Cardiologist:  Parke Poisson, MD Electrophysiologist:  Maurice Small, MD    History of Present Illness: .   Michael Bates is a 82 y.o. male.  Discussed the use of AI scribe software for clinical note transcription with the patient, who gave verbal consent to proceed.  History of Present Illness   The patient, with a history of atrial fibrillation and previous pulmonary vein isolation and CTI 09/19/22, presented for a follow-up after a recent ablation procedure at [facility] 03/20/23. The patient reported a recurrence of atrial fibrillation after the procedure and was initiated on Multaq. The patient was found to be in sinus rhythm during the visit and continues on Eliquis 5 mg bid and irbesartan 75 mg daily. The patient also takes Crestor 5 mg daily for hyperlipidemia.   Post-procedure, the patient experienced fluid retention, which was managed with Lasix and spironolactone. The patient also reported changes in blood pressure medication, discontinuing amlodipine due to le edema and initiating nifedipine 30 mg ER. The patient noted some swelling in the feet and ankles since starting nifedipine.  In addition to the changes in cardiac and blood pressure medications, the patient reported a switch in bladder medication from Myrbetriq to Vibegron. The patient also has a family history of carotid artery disease, with multiple maternal uncles requiring carotid stripping. The patient expressed interest in screening for carotid artery disease due to this family history.  However, the patient expressed concerns about potential side effects of rosuvastatin, including potential increases in intraocular pressure and cognitive decline. The patient has a calcium score of 82, indicating some degree of coronary artery disease. The patient also mentioned a  scar noted in the atrium during the ablation procedure, which was assumed to be due to a previous blockage. I have asked him to clarify this finding with his EP team.        ROS: negative except per HPI above.  Studies Reviewed: Marland Kitchen   EKG Interpretation Date/Time:  Monday April 23 2023 13:18:55 EST Ventricular Rate:  61 PR Interval:  252 QRS Duration:  88 QT Interval:  412 QTC Calculation: 414 R Axis:   -18  Text Interpretation: Sinus rhythm with 1st degree A-V block Nonspecific ST abnormality When compared with ECG of 08-Jan-2023 12:36, PR interval has increased Confirmed by Weston Brass ([id number]) on 04/23/2023 1:22:36 PM    Results   LABS Sodium: Low LDL: 75 (12/2022) Triglycerides: 48 (12/2022)  RADIOLOGY Calcium score: 82  DIAGNOSTIC EKG: Sinus rhythm, first-degree AV block (04/23/2023)     Risk Assessment/Calculations:             Physical Exam:   VS:  BP 118/60 (BP Location: Right Arm, Patient Position: Sitting, Cuff Size: Normal)   Pulse 61   Ht 5\' 11"  (1.803 m)   Wt 147 lb 12.8 oz (67 kg)   SpO2 97%   BMI 20.61 kg/m    Wt Readings from Last 3 Encounters:  04/23/23 147 lb 12.8 oz (67 kg)  01/08/23 155 lb (70.3 kg)  01/02/23 155 lb (70.3 kg)     Physical Exam   CHEST: Lung sounds clear, no fluid. CARDIOVASCULAR: Heart sounds normal, no murmurs.     GEN: Well nourished, well developed in no acute distress NECK: No JVD; No carotid bruits CARDIAC: RRR, no murmurs, rubs, gallops RESPIRATORY:  Clear to auscultation without rales, wheezing or rhonchi  ABDOMEN: Soft, non-tender, non-distended EXTREMITIES:  No edema; No deformity   ASSESSMENT AND PLAN: .    1. Atypical atrial flutter (HCC)   2. Essential hypertension   3. Hyperlipidemia, unspecified hyperlipidemia type   4. Coronary artery calcification   5. Secondary hypercoagulable state (HCC)     Assessment and Plan    Atrial Fibrillation Post pulmonary vein isolation and CTI 09/19/22 with  recurrence of atrial fibrillation. Currently in sinus rhythm. Tolerating Multaq well. -Continue Multaq and Eliquis 5mg  BID.  Hypertension Blood pressure well controlled on Irbesartan 75mg  and Nifedipine 30mg  ER. Noted peripheral edema potentially related to Nifedipine. -Continue current regimen and monitor for tolerability of edema.  Hyperlipidemia LDL slightly above goal at 75. Currently on Crestor 5mg  daily. Discussed potential side effects of statins including intraocular pressure and cognitive effects. -Continue Crestor 5mg  daily. -Consider switching to Atorvastatin 10mg  daily if intraocular pressure continues to be an issue per patient concern.  Family history of Carotid Artery Disease Discussed potential for carotid artery disease given family history and presence of coronary artery disease. -Order Carotid Ultrasounds.  Follow-up in 6 months.                Total time of encounter: 30 minutes total time of encounter, including 25 minutes spent in face-to-face patient care on the date of this encounter. This time includes coordination of care and counseling regarding above mentioned problem list. Remainder of non-face-to-face time involved reviewing chart documents/testing relevant to the patient encounter and documentation in the medical record. I have independently reviewed documentation from referring provider.   Weston Brass, MD, [facility]  [facility]

## 2023-04-23 NOTE — Patient Instructions (Signed)
Medication Instructions:  Your physician recommends that you continue on your current medications as directed. Please refer to the Current Medication list given to you today.  *If you need a refill on your cardiac medications before your next appointment, please call your pharmacy*  Lab Work: None  Testing/Procedures: Your physician has requested that you have a carotid duplex. This test is an ultrasound of the carotid arteries in your neck. It looks at blood flow through these arteries that supply the brain with blood. Allow one hour for this exam. There are no restrictions or special instructions. This will take place at [address] [facility], [address].  Please note: We ask at that you not bring children with you during ultrasound (echo/ vascular) testing. Due to room size and safety concerns, children are not allowed in the ultrasound rooms during exams. Our front office staff cannot provide observation of children in our lobby area while testing is being conducted. An adult accompanying a patient to their appointment will only be allowed in the ultrasound room at the discretion of the ultrasound technician under special circumstances. We apologize for any inconvenience.    Follow-Up: At [facility], you and your health needs are our priority.  As part of our continuing mission to provide you with exceptional heart care, we have created designated Provider Care Teams.  These Care Teams include your primary Cardiologist (physician) and Advanced Practice Providers (APPs -  Physician Assistants and Nurse Practitioners) who all work together to provide you with the care you need, when you need it.   Your next appointment:   6 month(s)  Provider:   Parke Poisson, MD

## 2023-05-07 ENCOUNTER — Other Ambulatory Visit: Payer: Self-pay | Admitting: Internal Medicine

## 2023-05-07 ENCOUNTER — Ambulatory Visit (HOSPITAL_COMMUNITY)
Admission: RE | Admit: 2023-05-07 | Discharge: 2023-05-07 | Disposition: A | Discharge status: Home / Self Care | Payer: Medicare PPO | Source: Ambulatory Visit | Attending: Cardiology | Admitting: Cardiology

## 2023-05-07 DIAGNOSIS — I484 Atypical atrial flutter: Secondary | ICD-10-CM

## 2023-05-07 DIAGNOSIS — I10 Essential (primary) hypertension: Secondary | ICD-10-CM

## 2023-05-07 DIAGNOSIS — E785 Hyperlipidemia, unspecified: Secondary | ICD-10-CM | POA: Diagnosis not present

## 2023-05-07 DIAGNOSIS — I6523 Occlusion and stenosis of bilateral carotid arteries: Secondary | ICD-10-CM

## 2023-05-07 DIAGNOSIS — I2510 Atherosclerotic heart disease of native coronary artery without angina pectoris: Secondary | ICD-10-CM

## 2023-05-07 DIAGNOSIS — D6869 Other thrombophilia: Secondary | ICD-10-CM

## 2023-05-15 DIAGNOSIS — D229 Melanocytic nevi, unspecified: Secondary | ICD-10-CM | POA: Diagnosis not present

## 2023-05-15 DIAGNOSIS — L821 Other seborrheic keratosis: Secondary | ICD-10-CM | POA: Diagnosis not present

## 2023-05-15 DIAGNOSIS — Z08 Encounter for follow-up examination after completed treatment for malignant neoplasm: Secondary | ICD-10-CM | POA: Diagnosis not present

## 2023-05-15 DIAGNOSIS — Z789 Other specified health status: Secondary | ICD-10-CM | POA: Diagnosis not present

## 2023-05-15 DIAGNOSIS — R229 Localized swelling, mass and lump, unspecified: Secondary | ICD-10-CM | POA: Diagnosis not present

## 2023-05-15 DIAGNOSIS — L820 Inflamed seborrheic keratosis: Secondary | ICD-10-CM | POA: Diagnosis not present

## 2023-05-15 DIAGNOSIS — R208 Other disturbances of skin sensation: Secondary | ICD-10-CM | POA: Diagnosis not present

## 2023-05-15 DIAGNOSIS — Z85828 Personal history of other malignant neoplasm of skin: Secondary | ICD-10-CM | POA: Diagnosis not present

## 2023-05-15 DIAGNOSIS — C44629 Squamous cell carcinoma of skin of left upper limb, including shoulder: Secondary | ICD-10-CM | POA: Diagnosis not present

## 2023-06-25 NOTE — Progress Notes (Signed)
 FOLLOW UP OFFICE VISIT   Date of Visit:  06/26/2023   Electrophysiologist:  Alyce File, MD  CLINICAL SUMMARY   Michael Bates, a 83 y.o. male, with a past medical history significant for, amongst others: Persistent Atrial Fibrillation / Atypical Atrial Flutter 09/19/22: PVI / CTI ([place]) 03/20/23: Redo-PVI / Ablation of roof-dependent LA Flutter / CTI Chronic Anticoagulation: Eliquis  CHADS2-Vasc Score: Age / >75 (2), Sex / Male (0), and Hypertension History (1) Hypertension Hypothyroidism Hyperlipidemia  HISTORY OF PRESENT ILLNESS   09/05/254: Initial Consult / Dr. File Mr. Michael Bates is a 82 y.o.male.    The patient is an 83 year old male presenting for atrial fibrillation.   He was experiencing atrial fibrillation and an ablation was attempted; however, they had difficulty with the veins on the right side because of the phrenic nerve.    Following his second COVID-19 vaccination, he experienced difficulty completing his daily brisk walk of 3 to 4 miles. Upon returning home, he noticed his heart rate was unusually variable and rapid. Unable to measure it accurately, he contacted his on-call internal medicine practice, who ordered a Holter monitor. The monitor revealed supraventricular tachycardia, though the difference between his brisk walking and the episodes was unclear. These episodes were sporadic and decreased in frequency and duration over about 5 days.   In 11/2021, after another episode post-second COVID-19 vaccination, he visited the ER and was diagnosed with atrial fibrillation (A. fib) via EKG. He received IV diltiazem  and metoprolol , which stabilized his blood pressure and heart rate. He was then prescribed blood thinners. He spent approximately 36 hours in the ER and was then discharged.    His first ablation was on 09/19/2022, at which time they experienced difficulty isolating the right superior vein, but the other veins were fine. They did a  cavotricuspid isthmus line for atrial flutter as well. His ablation was then followed by cardioversions on 11/08/2022, 12/13/2022, and 01/08/2023. Prior to his third cardioversion, he was prescribed Multaq  for 1 to 2 days. He believes he has experienced both atrial fibrillation and atrial flutter, as his wristwatch has detected flutter.    Post-cardioversion, he experienced pulmonary edema, which resolved over a few days with Lasix . He was advised to discontinue Multaq  but restarted it a week before his last cardioversion. During this period, he was diagnosed with both atrial fibrillation and atrial flutter while on Multaq . Since the last cardioversion, he has not experienced any episodes of atrial fibrillation or atrial flutter.   When experiencing atrial fibrillation or atrial flutter, he feels it in his chest, notices an elevated heart rate of 140 bpm, and has decreased exercise capacity. He frequently experiences lightheadedness and dizziness, which he attributes to his long-term medications. He also reports fatigue, tachycardia, and difficulty walking uphill briskly. Additionally, he experiences nosebleeds in the winter months and easy bruising as evident on his arms.    The patient's daughter is a physician and completed her residency at [employer]. She completed another couple of years at [employer] and is on the faculty at [employer].   03/20/23: Ablation / Dr. File Sinus rhythm on arrival Successful transseptal puncture x 2 with ICE guidance 3-D mapping of the LA and PV's fused to CT scan Successful redo isolation of the right and left pulmonary veins, successful posterior wall isolation Successful ablation of roof-dependent LA flutter around the RPVs and mitral flutter CTI block present at baseline AAD for 3 months post-procedure  INTERVAL HISTORY   Michael Bates Model, a 83 y.o. male,  is presenting for followup after ablation procedure on 03/20/23 with Dr. Lus. Since that time, he has been  doing well. No recurrences documented on his watch or felt by the patient. He is slowly getting back into baseline activity levels.  REVIEW OF SYSTEMS   Complete Review of Systems discussed and negative unless in this Section or in the Interval History  VITAL SIGNS / PHYSICAL EXAM   Vital Signs: BP 127/75 (BP Location: Left upper arm, Patient Position: Sitting, BP Cuff Size: Adult)   Pulse 62   Ht 180.3 cm (5' 10.98)   Wt 66.5 kg (146 lb 11.2 oz)   SpO2 99%   BMI 20.47 kg/m       Constitutional: Cooperative / No Acute Distress  Head: Normocephalic / Atraumatic Making Eye Contact  Neck: Normal ROM / Symmetrical  Pulmonary: Normal Work of Breathing Clear to Auscultation: Bilaterally  Cardiovascular: S1,S2 / RRR No Murmur Positive Peripheral Pulses  Abdominal: Normal Appearance  Musculoskeletal: Moving Extremities Independently  Neurological: Alert / Oriented  Skin: Warm / Dry  Psychological: Normal Mood / Normal Behavior / Normal Speech   DRUG ALLERGIES   Allergies  Allergen Reactions  . Lisinopril Cough  . Adhesive Other (See Comments)    Frail skin Can tolerate for short times - okay w/ PIV   CURRENT MEDICATION LIST   Current Outpatient Medications  Medication Sig Dispense Refill  . alendronate (FOSAMAX) 70 MG tablet Take 70 mg by mouth every 7 (seven) days TAKES ON SATURDAYS    . amLODIPine  (NORVASC ) 5 MG tablet Take 5 mg by mouth once daily    . ascorbic acid, vitamin C, (VITAMIN C) 500 MG tablet Take 500 mg by mouth 2 (two) times daily Bioflavonoid Products (ESTER C PO)    . beclomethasone dipropionate (QVAR REDIHALER HFA) 80 mcg/actuation inhaler Inhale 1 Puff into the lungs 2 (two) times daily    . buPROPion  (WELLBUTRIN  XL) 300 MG XL tablet Take 300 mg by mouth every morning    . cholecalciferol (VITAMIN D3) 5,000 unit capsule Take 5,000 Units by mouth once daily    . CIALIS  5 mg tablet Take 5 mg by mouth once daily    . cinnamon bark-chromium picolin 500-100  mg-mcg Cap Take 2 capsules by mouth every evening Advanced Strength Cinsulin    . coenzyme Q10 300 mg Cap Take 300 mg by mouth once daily    . dilTIAZem  (CARDIZEM ) 30 MG immediate release tablet Take 30 mg by mouth 3 (three) times daily as needed    . ELIQUIS  5 mg tablet Take 5 mg by mouth every 12 (twelve) hours    . fluorouraciL (EFUDEX) 5 % cream Apply topically 2 (two) times daily as needed    . FUROsemide  (LASIX ) 40 MG tablet Take 1 tablet by mouth once daily as needed for Edema    . glucosamine-chondroitin 500-400 mg tablet Take 1 tablet by mouth once daily    . irbesartan  (AVAPRO ) 150 MG tablet Take 1 tablet (150 mg total) by mouth once daily (Patient taking differently: Take 75 mg by mouth once daily) 90 tablet 3  . ketotifen (ZADITOR) 0.025 % (0.035 %) ophthalmic solution Place 1 drop into both eyes once daily as needed (allergies)    . levothyroxine  (SYNTHROID ) 200 MCG tablet Take 200 mcg by mouth once daily    . MULTAQ  400 mg tablet Take 1 tablet (400 mg total) by mouth 2 (two) times daily with meals 60 tablet 2  . multivitamin with  minerals, EYE, (PRESERVISION AREDS-2) soft gel capsule Take 1 capsule by mouth 2 (two) times daily with meals    . mv-min/folic/K1/lycopen/lutein (CENTRUM SILVER MEN ORAL) Take 1 tablet by mouth once daily    . MYRBETRIQ  50 mg ER tablet Take 50 mg by mouth once daily    . PROAIR HFA 90 mcg/actuation inhaler Inhale 1 Puff into the lungs every 6 (six) hours as needed for Shortness of Breath    . rosuvastatin  (CRESTOR ) 5 MG tablet Take 5 mg by mouth once daily    . spironolactone (ALDACTONE) 25 MG tablet Take 1 tablet (25 mg total) by mouth once daily 30 tablet 2  . turmeric root extract 500 mg Cap Take 500 mg by mouth once daily    . VYVANSE 30 mg capsule Take 30 mg by mouth every morning     No current facility-administered medications for this visit.   MEDICAL HISTORY / SOCIAL HISTORY  / FAMILY HISTORY   Pertinent portions of the patient's medical  history, social history, and family history have been reviewed and updated as indicated.  ELECTROCARDIOGRAM     DIAGNOSTIC STUDIES   N/A  LABORATORY STUDIES   Lab Results  Component Value Date   WBC 4.5 03/20/2023   HGB 13.4 (L) 03/20/2023   HCT 39.0 03/20/2023   MCV 95 03/20/2023   PLT 261 03/20/2023   Lab Results  Component Value Date   CREATININE 1.0 03/20/2023   BUN 16 03/20/2023   NA 134 (L) 03/20/2023   K 4.1 03/20/2023   CL 97 (L) 03/20/2023   CO2 27 03/20/2023   No results found for: ALT, AST, GGT, ALKPHOS, BILITOT  No results found for: TSH, T3TOTAL, T4TOTAL, THYROIDAB  Lab Results  Component Value Date   INR 1.4 (H) 03/20/2023    ASSESSMENT / PLAN OF CARE   1).  Persistent Atrial Fibrillation / Atrial Flutter Patient is doing well since his recent ablation No acute symptoms / No reports of recurrence EKG show SR with 1st degree AV block Follow up: PRN  2).  Chronic Anticoagulation CHAD Score: 3 No reports of significant bruising or bleeding Continue Eliquis   3). Medication Monitoring: Multaq  Chemistries (Every 6 months): 03/20/23 Electrocardiogram (Every 6 months): 06/26/23 Last LVEF: 55-60% (01/03/23) / [place] Patient is now 90+ days post-ablation / He can sop the Multaq   SIGNATURE SECTION   I spent a total of 30-39 minutes in both face-to-face and non-face-to-face activities for this visit on the date of this encounter.   Thank you very much for allowing us  to participate in the care of this patient.   Attestation Statement:   I personally performed the service, non-incident to. (WP)   JONATHAN DARRYLE LOWERS, NP

## 2023-06-26 DIAGNOSIS — Z5181 Encounter for therapeutic drug level monitoring: Secondary | ICD-10-CM | POA: Diagnosis not present

## 2023-06-26 DIAGNOSIS — Z7901 Long term (current) use of anticoagulants: Secondary | ICD-10-CM | POA: Diagnosis not present

## 2023-06-26 DIAGNOSIS — I4819 Other persistent atrial fibrillation: Secondary | ICD-10-CM | POA: Diagnosis not present

## 2023-06-26 DIAGNOSIS — Z79899 Other long term (current) drug therapy: Secondary | ICD-10-CM | POA: Diagnosis not present

## 2023-06-26 DIAGNOSIS — I484 Atypical atrial flutter: Secondary | ICD-10-CM | POA: Diagnosis not present

## 2023-07-12 DIAGNOSIS — H35033 Hypertensive retinopathy, bilateral: Secondary | ICD-10-CM | POA: Diagnosis not present

## 2023-07-12 DIAGNOSIS — H353132 Nonexudative age-related macular degeneration, bilateral, intermediate dry stage: Secondary | ICD-10-CM | POA: Diagnosis not present

## 2023-08-03 ENCOUNTER — Telehealth (HOSPITAL_COMMUNITY): Payer: Self-pay | Admitting: *Deleted

## 2023-08-03 MED ORDER — MULTAQ 400 MG PO TABS: 400.0000 mg | ORAL_TABLET | Freq: Two times a day (BID) | ORAL | 4 refills | Status: DC

## 2023-08-03 NOTE — Telephone Encounter (Signed)
Patient called in stating he was taken off multaq 3 months post ablation but over the last 2 weeks he has had an increase in afib burden discussed with Jorja Loa PA will restart multaq 400mg  bid and follow up in 3-4 weeks. Pt in agreement.

## 2023-08-21 ENCOUNTER — Encounter (HOSPITAL_COMMUNITY): Payer: Self-pay | Admitting: Physician Assistant

## 2023-08-21 ENCOUNTER — Ambulatory Visit (HOSPITAL_COMMUNITY)
Admission: RE | Admit: 2023-08-21 | Discharge: 2023-08-21 | Disposition: A | Discharge status: Home / Self Care | Payer: Medicare PPO | Source: Ambulatory Visit | Attending: Physician Assistant | Admitting: Physician Assistant

## 2023-08-21 VITALS — BP 114/58 | HR 61 | Ht 71.0 in | Wt 150.6 lb

## 2023-08-21 DIAGNOSIS — D6869 Other thrombophilia: Secondary | ICD-10-CM | POA: Diagnosis not present

## 2023-08-21 DIAGNOSIS — E785 Hyperlipidemia, unspecified: Secondary | ICD-10-CM | POA: Diagnosis not present

## 2023-08-21 DIAGNOSIS — Z7901 Long term (current) use of anticoagulants: Secondary | ICD-10-CM | POA: Diagnosis not present

## 2023-08-21 DIAGNOSIS — I4819 Other persistent atrial fibrillation: Secondary | ICD-10-CM | POA: Diagnosis not present

## 2023-08-21 DIAGNOSIS — I10 Essential (primary) hypertension: Secondary | ICD-10-CM | POA: Insufficient documentation

## 2023-08-21 DIAGNOSIS — Z79899 Other long term (current) drug therapy: Secondary | ICD-10-CM | POA: Diagnosis not present

## 2023-08-21 DIAGNOSIS — I4892 Unspecified atrial flutter: Secondary | ICD-10-CM | POA: Diagnosis not present

## 2023-08-21 NOTE — Progress Notes (Signed)
 Primary Care Physician: Thana Ates, MD Referring Physician: ER f/u Cardiologist: Dr. Jacques Navy Primary EP: Dr Nelly Laurence   Michael Bates is a 83 y.o. male with a h/o SVT, HTN, HLD, atrial flutter, atrial fibrillation who presents for follow up in the [facility]. Patient is s/p afib and flutter ablation with Dr Nelly Laurence on 09/19/22. He is on Eliquis for a CHA2DS2VASc score of 3. He was seen 10/19/22 and found to be back in afib and underwent DCCV on 11/08/22, 12/13/22, and 01/08/23. He was started on Multaq. He underwent repeat ablation at [facility] on 03/20/23. His Multaq was discontinued 3 months post ablation but he had recurrence of afib and Multaq was resumed on 08/03/23.   Patient returns for follow up for atrial fibrillation and Multaq monitoring. Patient remains in SR. He has not had any further episodes of afib. He does have chronic lower extremity edema. He also notes a feel of not being about to "get the air out" when exercising. He does have a history of asthma.   Today, he denies symptoms of palpitations, chest pain, shortness of breath, orthopnea, PND, dizziness, presyncope, syncope, snoring, daytime somnolence, bleeding, or neurologic sequela. The patient is tolerating medications without difficulties and is otherwise without complaint today.    Past Medical History:  Diagnosis Date   Asthma    Atrial fibrillation (HCC)    BPH (benign prostatic hyperplasia)    Cancer (HCC)    hx of skin cancer    Degenerative arthritis of cervical spine    c5-c6   Dysrhythmia    hx of pvcs in the past    Family history of adverse reaction to anesthesia    Father had idosyncratic narcotic  reaction   GERD (gastroesophageal reflux disease)    occ    Hyperlipidemia    Hypertension    Hypothyroidism    Osteopenia    Raynaud disease    Renal disease    stage 3, patient not aware of this diagnosis   Thyroid disease     ROS- All systems are reviewed and negative except as  per the HPI above  Physical Exam: Vitals:   08/21/23 1506  BP: (!) 114/58  Pulse: 61  Weight: 68.3 kg  Height: 5\' 11"  (1.803 m)     Wt Readings from Last 3 Encounters:  08/21/23 68.3 kg  04/23/23 67 kg  01/08/23 70.3 kg    GEN: Well nourished, well developed in no acute distress CARDIAC: Regular rate and rhythm, no murmurs, rubs, gallops RESPIRATORY:  Clear to auscultation without rales, wheezing or rhonchi  ABDOMEN: Soft, non-tender, non-distended EXTREMITIES:  trace bilateral lower extremity edema, No deformity    EKG today demonstrates SR, 1st degree AV block Vent. rate 61 BPM PR interval 250 ms QRS duration 86 ms QT/QTcB 410/412 ms   Echo 11/28/21  1. Left ventricular ejection fraction, by estimation, is 60 to 65%. The  left ventricle has normal function. The left ventricle has no regional  wall motion abnormalities. There is mild left ventricular hypertrophy.  Left ventricular diastolic parameters are indeterminate.   2. Right ventricular systolic function is normal. The right ventricular  size is normal. There is normal pulmonary artery systolic pressure.   3. The mitral valve is grossly normal. Trivial mitral valve  regurgitation.   4. The aortic valve is tricuspid. Aortic valve regurgitation is not  visualized.   5. Aortic no significant ascending aneurysm.   6. The inferior vena cava is  normal in size with greater than 50%  respiratory variability, suggesting right atrial pressure of 3 mmHg.   Comparison(s): No prior Echocardiogram.    Epic notes reviewed.  CHA2DS2-VASc Score = 4  The patient's score is based upon: CHF History: 0 HTN History: 1 Diabetes History: 0 Stroke History: 0 Vascular Disease History: 1 (aoritc atherosclerosis) Age Score: 2 Gender Score: 0       ASSESSMENT AND PLAN: Persistent Atrial Fibrillation/atrial flutter The patient's CHA2DS2-VASc score is 4, indicating a 4.8% annual risk of stroke.   S/p afib and flutter  ablation 09/19/22 Patient appears to be maintaining SR. He would like another trial off Multaq to see if he maintains SR. Will discontinue Multaq today.  Continue Eliquis 5 mg BID Continue diltiazem 30 mg PRN q 4 hours for heart racing Off scheduled BB due to history of bradycardia  Secondary Hypercoagulable State (ICD10:  D68.69) The patient is at significant risk for stroke/thromboembolism based upon his CHA2DS2-VASc Score of 4.  Continue Apixaban (Eliquis). No bleeding issues on anticoagulation.   HTN Stable on current regimen Could consider switching amlodipine to alternate HTN agent  of ankle edema becomes intolerable. He does still have furosemide which he can take PRN.    Follow up in the AF clinic in 4 months.    Jorja Loa PA-C [facility] [address] [place], [place] [address] [phone]

## 2023-09-05 DIAGNOSIS — L821 Other seborrheic keratosis: Secondary | ICD-10-CM | POA: Diagnosis not present

## 2023-09-05 DIAGNOSIS — R229 Localized swelling, mass and lump, unspecified: Secondary | ICD-10-CM | POA: Diagnosis not present

## 2023-09-05 DIAGNOSIS — D485 Neoplasm of uncertain behavior of skin: Secondary | ICD-10-CM | POA: Diagnosis not present

## 2023-09-05 DIAGNOSIS — Z08 Encounter for follow-up examination after completed treatment for malignant neoplasm: Secondary | ICD-10-CM | POA: Diagnosis not present

## 2023-09-05 DIAGNOSIS — D2371 Other benign neoplasm of skin of right lower limb, including hip: Secondary | ICD-10-CM | POA: Diagnosis not present

## 2023-09-05 DIAGNOSIS — C44629 Squamous cell carcinoma of skin of left upper limb, including shoulder: Secondary | ICD-10-CM | POA: Diagnosis not present

## 2023-09-05 DIAGNOSIS — Z85828 Personal history of other malignant neoplasm of skin: Secondary | ICD-10-CM | POA: Diagnosis not present

## 2023-09-05 DIAGNOSIS — L570 Actinic keratosis: Secondary | ICD-10-CM | POA: Diagnosis not present

## 2023-09-05 DIAGNOSIS — D229 Melanocytic nevi, unspecified: Secondary | ICD-10-CM | POA: Diagnosis not present

## 2023-11-15 DIAGNOSIS — I4819 Other persistent atrial fibrillation: Secondary | ICD-10-CM | POA: Diagnosis not present

## 2023-11-15 DIAGNOSIS — Z7901 Long term (current) use of anticoagulants: Secondary | ICD-10-CM | POA: Diagnosis not present

## 2023-11-15 DIAGNOSIS — I484 Atypical atrial flutter: Secondary | ICD-10-CM | POA: Diagnosis not present

## 2023-11-15 DIAGNOSIS — I10 Essential (primary) hypertension: Secondary | ICD-10-CM | POA: Diagnosis not present

## 2023-12-03 IMAGING — DX DG CHEST 1V PORT
1 series · 1 of 1 positions shown · non-contrast
Comparison: 03/02/2020

CLINICAL DATA: Weakness

EXAM:
PORTABLE CHEST 1 VIEW

[chest ap]
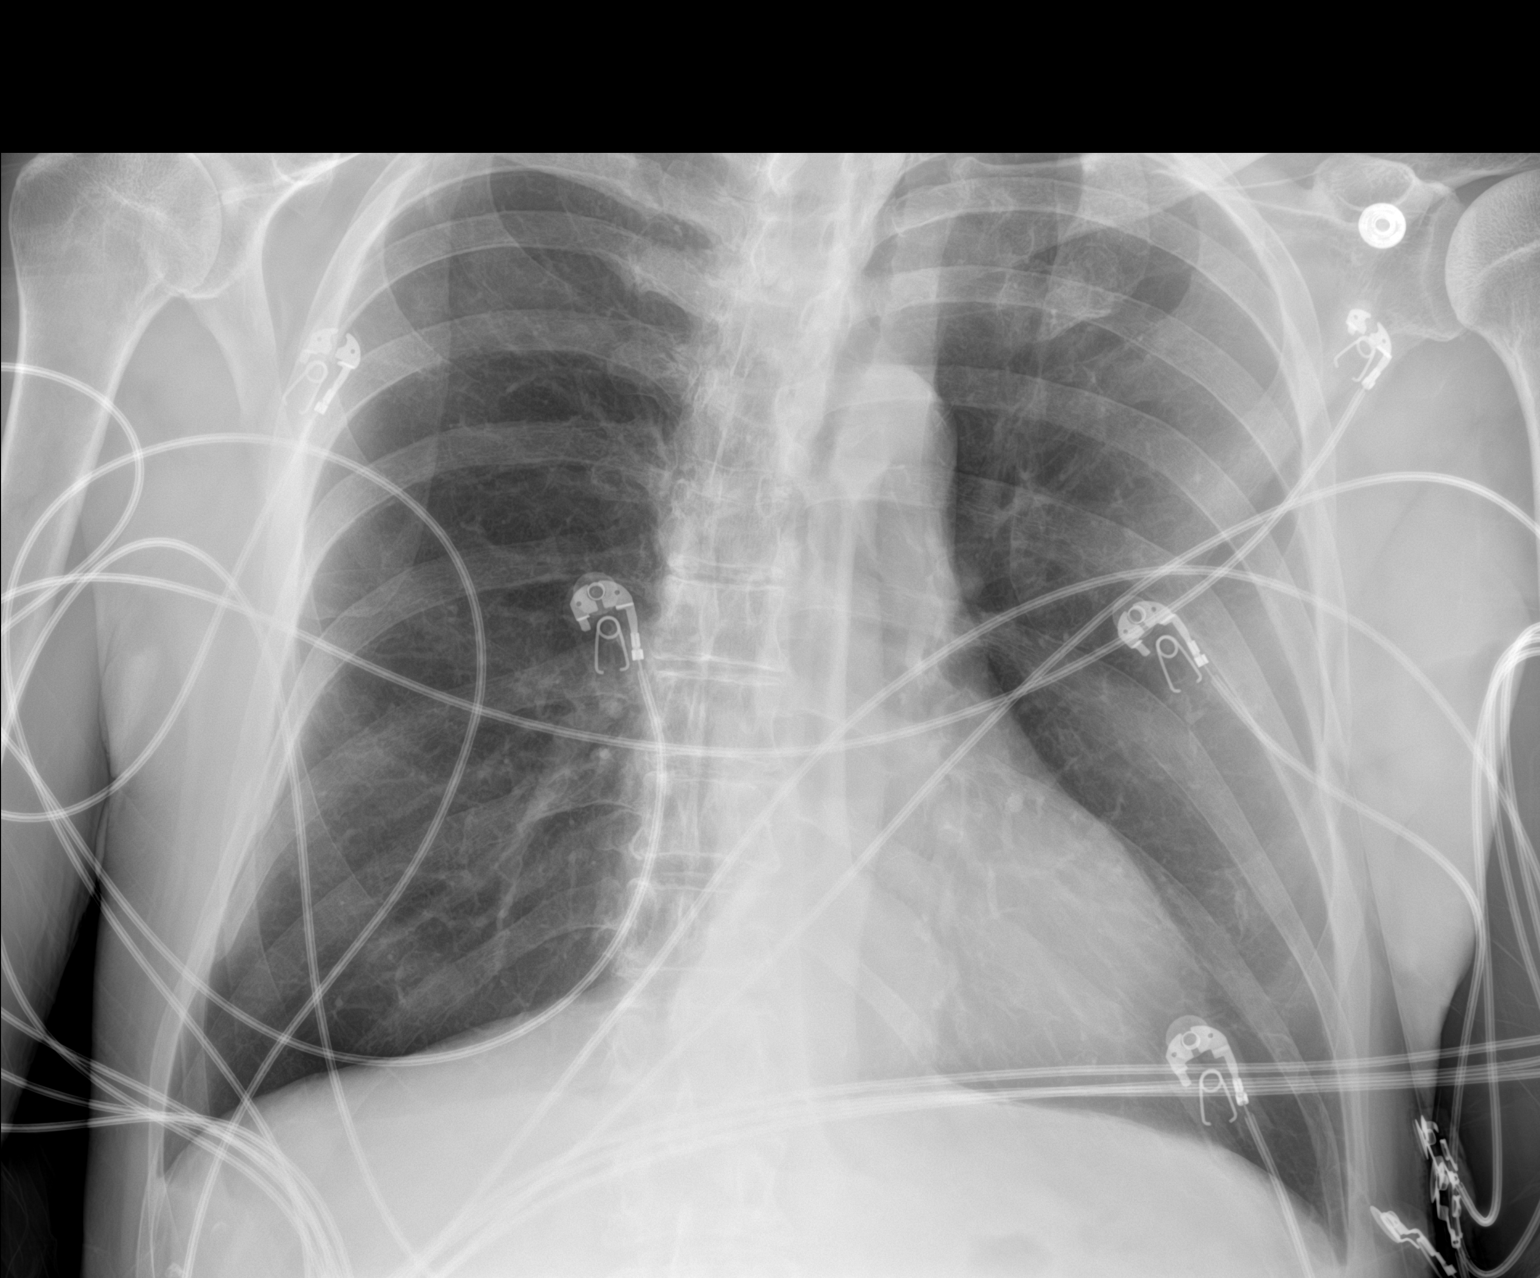

[1 of 1 positions shown; findings below may reference images not displayed]

FINDINGS: The heart size and mediastinal contours are within normal limits.
Both lungs are clear. The visualized skeletal structures are
unremarkable.
IMPRESSION: No acute abnormality of the lungs in AP portable projection.

## 2023-12-13 IMAGING — CR DG CHEST 2V
2 series · 2 of 2 positions shown · non-contrast
Comparison: Chest 11/27/2021

CLINICAL DATA: Hypertension and atrial fibrillation.

EXAM:
CHEST - 2 VIEW

[w chest pa]
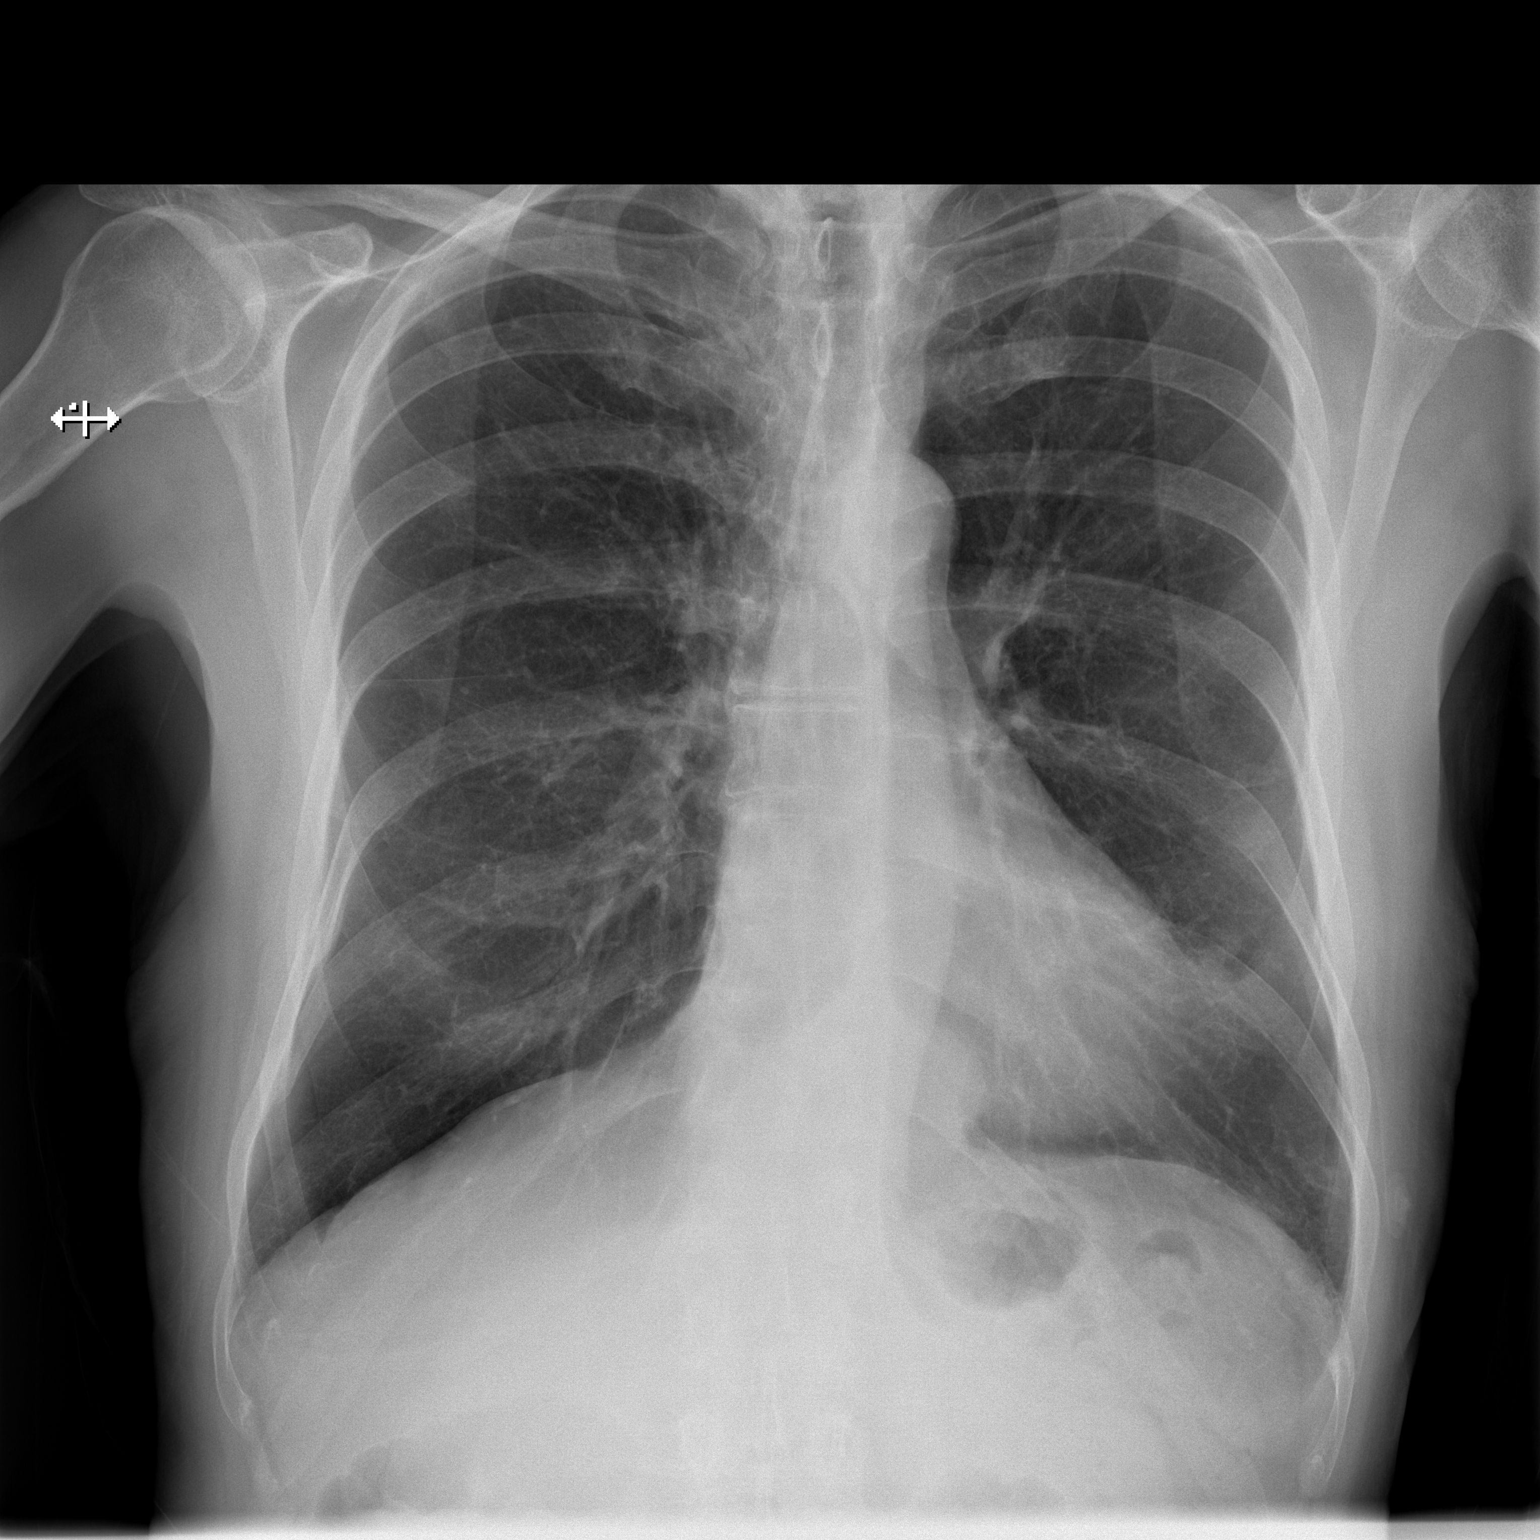

[w chest lat]
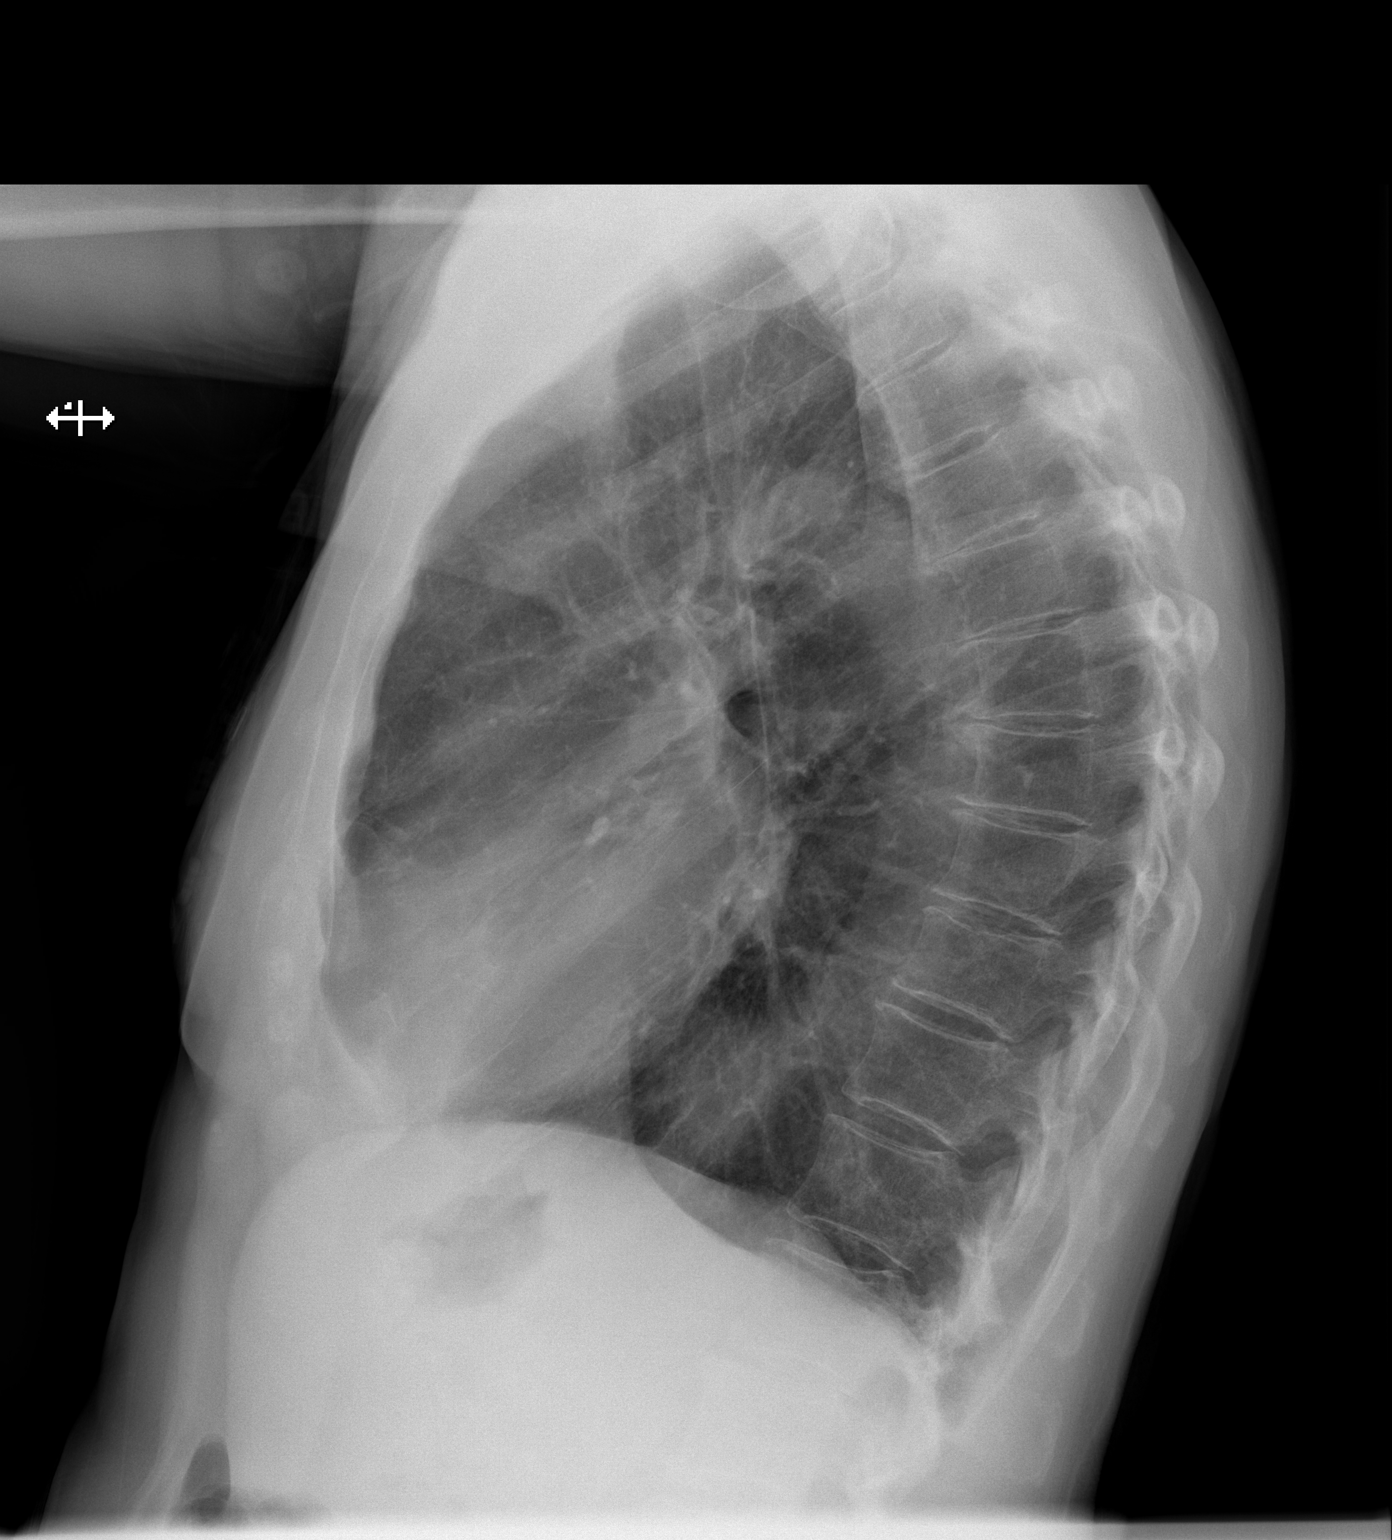

[2 of 2 positions shown; findings below may reference images not displayed]

FINDINGS: The heart size and mediastinal contours are within normal limits.
Both lungs are clear. The visualized skeletal structures are
unremarkable.
IMPRESSION: No active cardiopulmonary disease.

## 2024-01-02 ENCOUNTER — Ambulatory Visit (HOSPITAL_COMMUNITY): Payer: Medicare PPO | Admitting: Physician Assistant

## 2024-01-09 DIAGNOSIS — Z79899 Other long term (current) drug therapy: Secondary | ICD-10-CM | POA: Diagnosis not present

## 2024-01-09 DIAGNOSIS — M85852 Other specified disorders of bone density and structure, left thigh: Secondary | ICD-10-CM | POA: Diagnosis not present

## 2024-01-09 DIAGNOSIS — Z1331 Encounter for screening for depression: Secondary | ICD-10-CM | POA: Diagnosis not present

## 2024-01-09 DIAGNOSIS — Z0000 Encounter for general adult medical examination without abnormal findings: Secondary | ICD-10-CM | POA: Diagnosis not present

## 2024-01-09 DIAGNOSIS — I484 Atypical atrial flutter: Secondary | ICD-10-CM | POA: Diagnosis not present

## 2024-01-09 DIAGNOSIS — E039 Hypothyroidism, unspecified: Secondary | ICD-10-CM | POA: Diagnosis not present

## 2024-01-09 DIAGNOSIS — M8580 Other specified disorders of bone density and structure, unspecified site: Secondary | ICD-10-CM | POA: Diagnosis not present

## 2024-01-09 DIAGNOSIS — I10 Essential (primary) hypertension: Secondary | ICD-10-CM | POA: Diagnosis not present

## 2024-01-09 DIAGNOSIS — F900 Attention-deficit hyperactivity disorder, predominantly inattentive type: Secondary | ICD-10-CM | POA: Diagnosis not present

## 2024-01-09 DIAGNOSIS — E7800 Pure hypercholesterolemia, unspecified: Secondary | ICD-10-CM | POA: Diagnosis not present

## 2024-01-09 DIAGNOSIS — I480 Paroxysmal atrial fibrillation: Secondary | ICD-10-CM | POA: Diagnosis not present

## 2024-01-09 LAB — LAB REPORT - SCANNED: EGFR: 65

## 2024-01-10 ENCOUNTER — Other Ambulatory Visit (HOSPITAL_BASED_OUTPATIENT_CLINIC_OR_DEPARTMENT_OTHER): Payer: Self-pay | Admitting: Internal Medicine

## 2024-01-10 DIAGNOSIS — I4891 Unspecified atrial fibrillation: Secondary | ICD-10-CM | POA: Diagnosis not present

## 2024-01-10 DIAGNOSIS — M8580 Other specified disorders of bone density and structure, unspecified site: Secondary | ICD-10-CM

## 2024-01-10 DIAGNOSIS — H35033 Hypertensive retinopathy, bilateral: Secondary | ICD-10-CM | POA: Diagnosis not present

## 2024-01-10 DIAGNOSIS — H353132 Nonexudative age-related macular degeneration, bilateral, intermediate dry stage: Secondary | ICD-10-CM | POA: Diagnosis not present

## 2024-01-14 ENCOUNTER — Ambulatory Visit (HOSPITAL_COMMUNITY)
Admission: RE | Admit: 2024-01-14 | Discharge: 2024-01-14 | Disposition: A | Discharge status: Home / Self Care | Source: Ambulatory Visit | Attending: Physician Assistant | Admitting: Physician Assistant

## 2024-01-14 ENCOUNTER — Encounter (HOSPITAL_COMMUNITY): Payer: Self-pay | Admitting: Physician Assistant

## 2024-01-14 VITALS — BP 114/70 | HR 74 | Ht 71.0 in | Wt 146.4 lb

## 2024-01-14 DIAGNOSIS — I4819 Other persistent atrial fibrillation: Secondary | ICD-10-CM | POA: Diagnosis not present

## 2024-01-14 DIAGNOSIS — D6869 Other thrombophilia: Secondary | ICD-10-CM | POA: Diagnosis not present

## 2024-01-14 NOTE — Progress Notes (Signed)
 Primary Care Physician: Dwight Trula SQUIBB, MD Referring Physician: ER f/u Cardiologist: Dr. Loni Primary EP: Dr Nancey   Michael Bates Model is a 83 y.o. male with a h/o SVT, HTN, HLD, atrial flutter, atrial fibrillation who presents for follow up in the [facility]. Patient is s/p afib and flutter ablation with Dr Nancey on 09/19/22. He is on Eliquis  for stroke prevention. He was seen 10/19/22 and found to be back in afib and underwent DCCV on 11/08/22, 12/13/22, and 01/08/23. He was started on Multaq . He underwent repeat ablation at [facility] on 03/20/23. His Multaq  was discontinued 3 months post ablation but he had recurrence of afib and Multaq  was resumed on 08/03/23.   Patient returns for follow up for atrial fibrillation. He reports that he has done well since his last visit. He is maintaining SR off Multaq . No significant bleeding issues on anticoagulation. He does occasionally have palpitations associated with PACs (noted on smart watch strips).   Today, he  denies symptoms of chest pain, shortness of breath, orthopnea, PND, lower extremity edema, dizziness, presyncope, syncope, snoring, daytime somnolence, bleeding, or neurologic sequela. The patient is tolerating medications without difficulties and is otherwise without complaint today.    Past Medical History:  Diagnosis Date   Asthma    Atrial fibrillation (HCC)    BPH (benign prostatic hyperplasia)    Cancer (HCC)    hx of skin cancer    Degenerative arthritis of cervical spine    c5-c6   Dysrhythmia    hx of pvcs in the past    Family history of adverse reaction to anesthesia    Father had idosyncratic narcotic  reaction   GERD (gastroesophageal reflux disease)    occ    Hyperlipidemia    Hypertension    Hypothyroidism    Osteopenia    Raynaud disease    Renal disease    stage 3, patient not aware of this diagnosis   Thyroid  disease     ROS- All systems are reviewed and negative except as per the HPI  above  Physical Exam: Vitals:   01/14/24 1322  BP: 114/70  Pulse: 74  Weight: 66.4 kg  Height: 5' 11 (1.803 m)    Wt Readings from Last 3 Encounters:  01/14/24 66.4 kg  08/21/23 68.3 kg  04/23/23 67 kg    GEN: Well nourished, well developed in no acute distress CARDIAC: Regular rate and rhythm, no murmurs, rubs, gallops RESPIRATORY:  Clear to auscultation without rales, wheezing or rhonchi  ABDOMEN: Soft, non-tender, non-distended EXTREMITIES:  No edema; No deformity    EKG today demonstrates SR Vent. rate 74 BPM PR interval 196 ms QRS duration 82 ms QT/QTcB 384/426 ms   Echo 01/03/23  1. Left ventricular ejection fraction, by estimation, is 55 to 60%. The  left ventricle has normal function. The left ventricle has no regional  wall motion abnormalities. There is mild concentric left ventricular  hypertrophy. Left ventricular diastolic function could not be evaluated.   2. Right ventricular systolic function is normal. The right ventricular  size is normal. There is normal pulmonary artery systolic pressure. The  estimated right ventricular systolic pressure is 34.1 mmHg.   3. Left atrial size was mild to moderately dilated.   4. The mitral valve is normal in structure. Moderate mitral valve  regurgitation. No evidence of mitral stenosis.   5. Tricuspid valve regurgitation is mild to moderate.   6. The aortic valve is tricuspid. There is  mild calcification of the  aortic valve. Aortic valve regurgitation is not visualized. Aortic valve  sclerosis/calcification is present, without any evidence of aortic  stenosis.   7. The inferior vena cava is normal in size with greater than 50%  respiratory variability, suggesting right atrial pressure of 3 mmHg.    Epic notes reviewed.  CHA2DS2-VASc Score = 4  The patient's score is based upon: CHF History: 0 HTN History: 1 Diabetes History: 0 Stroke History: 0 Vascular Disease History: 1 (aoritc atherosclerosis) Age  Score: 2 Gender Score: 0       ASSESSMENT AND PLAN: Persistent Atrial Fibrillation/atrial flutter (ICD10:  I48.19) The patient's CHA2DS2-VASc score is 4, indicating a 4.8% annual risk of stroke.   S/p afib and flutter ablation 09/19/22 and repeat ablation 03/20/23 at [facility]. Patient appears to be maintaining SR Continue Eliquis  5 mg BID Off scheduled BB due to history of bradycardia Continue diltiazem  30 mg PRN q 4 hours for heart racing Smart watch for home monitoring.   Secondary Hypercoagulable State (ICD10:  D68.69) The patient is at significant risk for stroke/thromboembolism based upon his CHA2DS2-VASc Score of 4.  Continue Apixaban  (Eliquis ). No bleeding issues.   HTN Stable on current regimen   Follow up in the AF clinic in one year.    Daril Kicks PA-C Afib Clinic [facility] [address] [place], [place] [address] [phone]

## 2024-01-15 DIAGNOSIS — L814 Other melanin hyperpigmentation: Secondary | ICD-10-CM | POA: Diagnosis not present

## 2024-01-15 DIAGNOSIS — C44222 Squamous cell carcinoma of skin of right ear and external auricular canal: Secondary | ICD-10-CM | POA: Diagnosis not present

## 2024-01-15 DIAGNOSIS — C44629 Squamous cell carcinoma of skin of left upper limb, including shoulder: Secondary | ICD-10-CM | POA: Diagnosis not present

## 2024-01-15 DIAGNOSIS — D1801 Hemangioma of skin and subcutaneous tissue: Secondary | ICD-10-CM | POA: Diagnosis not present

## 2024-01-15 DIAGNOSIS — C44622 Squamous cell carcinoma of skin of right upper limb, including shoulder: Secondary | ICD-10-CM | POA: Diagnosis not present

## 2024-01-15 DIAGNOSIS — Z85828 Personal history of other malignant neoplasm of skin: Secondary | ICD-10-CM | POA: Diagnosis not present

## 2024-01-15 DIAGNOSIS — C44329 Squamous cell carcinoma of skin of other parts of face: Secondary | ICD-10-CM | POA: Diagnosis not present

## 2024-01-15 DIAGNOSIS — Z08 Encounter for follow-up examination after completed treatment for malignant neoplasm: Secondary | ICD-10-CM | POA: Diagnosis not present

## 2024-01-15 DIAGNOSIS — L821 Other seborrheic keratosis: Secondary | ICD-10-CM | POA: Diagnosis not present

## 2024-01-16 DIAGNOSIS — H40013 Open angle with borderline findings, low risk, bilateral: Secondary | ICD-10-CM | POA: Diagnosis not present

## 2024-01-16 DIAGNOSIS — Z1212 Encounter for screening for malignant neoplasm of rectum: Secondary | ICD-10-CM | POA: Diagnosis not present

## 2024-01-16 DIAGNOSIS — Z1211 Encounter for screening for malignant neoplasm of colon: Secondary | ICD-10-CM | POA: Diagnosis not present

## 2024-01-16 DIAGNOSIS — Z961 Presence of intraocular lens: Secondary | ICD-10-CM | POA: Diagnosis not present

## 2024-01-16 DIAGNOSIS — H52203 Unspecified astigmatism, bilateral: Secondary | ICD-10-CM | POA: Diagnosis not present

## 2024-01-16 DIAGNOSIS — H353132 Nonexudative age-related macular degeneration, bilateral, intermediate dry stage: Secondary | ICD-10-CM | POA: Diagnosis not present

## 2024-01-18 DIAGNOSIS — Z79899 Other long term (current) drug therapy: Secondary | ICD-10-CM | POA: Diagnosis not present

## 2024-01-20 DIAGNOSIS — S82002A Unspecified fracture of left patella, initial encounter for closed fracture: Secondary | ICD-10-CM | POA: Diagnosis not present

## 2024-01-20 DIAGNOSIS — S52125A Nondisplaced fracture of head of left radius, initial encounter for closed fracture: Secondary | ICD-10-CM | POA: Diagnosis not present

## 2024-01-23 ENCOUNTER — Ambulatory Visit: Admitting: Internal Medicine

## 2024-01-24 LAB — COLOGUARD: COLOGUARD: POSITIVE — AB

## 2024-01-29 DIAGNOSIS — S82002D Unspecified fracture of left patella, subsequent encounter for closed fracture with routine healing: Secondary | ICD-10-CM | POA: Diagnosis not present

## 2024-01-29 DIAGNOSIS — S52125D Nondisplaced fracture of head of left radius, subsequent encounter for closed fracture with routine healing: Secondary | ICD-10-CM | POA: Diagnosis not present

## 2024-02-12 DIAGNOSIS — S82002D Unspecified fracture of left patella, subsequent encounter for closed fracture with routine healing: Secondary | ICD-10-CM | POA: Diagnosis not present

## 2024-02-12 DIAGNOSIS — S52125D Nondisplaced fracture of head of left radius, subsequent encounter for closed fracture with routine healing: Secondary | ICD-10-CM | POA: Diagnosis not present

## 2024-02-22 ENCOUNTER — Encounter: Payer: Self-pay | Admitting: Physician Assistant

## 2024-02-25 DIAGNOSIS — H35033 Hypertensive retinopathy, bilateral: Secondary | ICD-10-CM | POA: Diagnosis not present

## 2024-02-25 DIAGNOSIS — H353132 Nonexudative age-related macular degeneration, bilateral, intermediate dry stage: Secondary | ICD-10-CM | POA: Diagnosis not present

## 2024-02-29 DIAGNOSIS — Z79899 Other long term (current) drug therapy: Secondary | ICD-10-CM | POA: Diagnosis not present

## 2024-03-04 DIAGNOSIS — S52125D Nondisplaced fracture of head of left radius, subsequent encounter for closed fracture with routine healing: Secondary | ICD-10-CM | POA: Diagnosis not present

## 2024-03-04 DIAGNOSIS — S82002D Unspecified fracture of left patella, subsequent encounter for closed fracture with routine healing: Secondary | ICD-10-CM | POA: Diagnosis not present

## 2024-03-13 DIAGNOSIS — S82002D Unspecified fracture of left patella, subsequent encounter for closed fracture with routine healing: Secondary | ICD-10-CM | POA: Diagnosis not present

## 2024-03-13 DIAGNOSIS — M6281 Muscle weakness (generalized): Secondary | ICD-10-CM | POA: Diagnosis not present

## 2024-03-13 DIAGNOSIS — S52122D Displaced fracture of head of left radius, subsequent encounter for closed fracture with routine healing: Secondary | ICD-10-CM | POA: Diagnosis not present

## 2024-03-13 DIAGNOSIS — M25622 Stiffness of left elbow, not elsewhere classified: Secondary | ICD-10-CM | POA: Diagnosis not present

## 2024-03-13 DIAGNOSIS — R269 Unspecified abnormalities of gait and mobility: Secondary | ICD-10-CM | POA: Diagnosis not present

## 2024-03-13 DIAGNOSIS — M25662 Stiffness of left knee, not elsewhere classified: Secondary | ICD-10-CM | POA: Diagnosis not present

## 2024-03-18 DIAGNOSIS — M25662 Stiffness of left knee, not elsewhere classified: Secondary | ICD-10-CM | POA: Diagnosis not present

## 2024-03-18 DIAGNOSIS — S52122D Displaced fracture of head of left radius, subsequent encounter for closed fracture with routine healing: Secondary | ICD-10-CM | POA: Diagnosis not present

## 2024-03-18 DIAGNOSIS — R269 Unspecified abnormalities of gait and mobility: Secondary | ICD-10-CM | POA: Diagnosis not present

## 2024-03-18 DIAGNOSIS — S82002D Unspecified fracture of left patella, subsequent encounter for closed fracture with routine healing: Secondary | ICD-10-CM | POA: Diagnosis not present

## 2024-03-18 DIAGNOSIS — M6281 Muscle weakness (generalized): Secondary | ICD-10-CM | POA: Diagnosis not present

## 2024-03-18 DIAGNOSIS — M25622 Stiffness of left elbow, not elsewhere classified: Secondary | ICD-10-CM | POA: Diagnosis not present

## 2024-03-25 DIAGNOSIS — M25622 Stiffness of left elbow, not elsewhere classified: Secondary | ICD-10-CM | POA: Diagnosis not present

## 2024-03-25 DIAGNOSIS — M6281 Muscle weakness (generalized): Secondary | ICD-10-CM | POA: Diagnosis not present

## 2024-03-25 DIAGNOSIS — S52125D Nondisplaced fracture of head of left radius, subsequent encounter for closed fracture with routine healing: Secondary | ICD-10-CM | POA: Diagnosis not present

## 2024-03-25 DIAGNOSIS — S82092D Other fracture of left patella, subsequent encounter for closed fracture with routine healing: Secondary | ICD-10-CM | POA: Diagnosis not present

## 2024-03-25 DIAGNOSIS — M25662 Stiffness of left knee, not elsewhere classified: Secondary | ICD-10-CM | POA: Diagnosis not present

## 2024-04-01 DIAGNOSIS — M6281 Muscle weakness (generalized): Secondary | ICD-10-CM | POA: Diagnosis not present

## 2024-04-01 DIAGNOSIS — S82092D Other fracture of left patella, subsequent encounter for closed fracture with routine healing: Secondary | ICD-10-CM | POA: Diagnosis not present

## 2024-04-01 DIAGNOSIS — S52125D Nondisplaced fracture of head of left radius, subsequent encounter for closed fracture with routine healing: Secondary | ICD-10-CM | POA: Diagnosis not present

## 2024-04-01 DIAGNOSIS — M25662 Stiffness of left knee, not elsewhere classified: Secondary | ICD-10-CM | POA: Diagnosis not present

## 2024-04-01 DIAGNOSIS — Z8781 Personal history of (healed) traumatic fracture: Secondary | ICD-10-CM | POA: Diagnosis not present

## 2024-04-01 DIAGNOSIS — M25622 Stiffness of left elbow, not elsewhere classified: Secondary | ICD-10-CM | POA: Diagnosis not present

## 2024-04-02 NOTE — Progress Notes (Unsigned)
 Ellouise Console, PA-C [address] [place], [place]  [address] Phone: [phone]   Gastroenterology Consultation  Referring Provider:     Dwight Trula SQUIBB, MD Primary Care Physician:  Dwight Trula SQUIBB, MD Primary Gastroenterologist:  Ellouise Console, PA-C / Elspeth Naval, MD  Reason for Consultation:     Positive Cologuard.  Discuss repeat colonoscopy.        HPI:   Discussed the use of AI scribe software for clinical note transcription with the patient, who gave verbal consent to proceed. History of Present Illness Michael Bates is an 83 year old male, new patient, who presents for evaluation following a positive Cologuard test done by his PCP Dr. Dwight with [facility].  He had a colonoscopy in 2017 which showed no polyps. In early August, he had a positive Cologuard test.  Patient states he has had several negative colonoscopies with no personal history of colon polyps.  The patient denies blood in stool, diarrhea, constipation, or unusual weight loss. Family history includes a maternal uncle who died from colon cancer and a brother with frequent polyps.  Patient is requesting to have a repeat colonoscopy.  He has a history of atrial fibrillation and has undergone two ablations, the most recent in April 2024. He is followed by a local cardiologist and the AFib clinic. He is currently on Eliquis . He maintains an active lifestyle, walking briskly four miles a day.  He has a history of hemorrhoids and underwent an operative hemorrhoidectomy several years ago. He is familiar with the internal hemorrhoid banding procedure but has not had it done.  No history of blood clot events, DVT, or pulmonary embolus. No recent anemia, with a recent hemoglobin level of 13.1.  He is in very good health for his age.  12/2015 last screening colonoscopy by Dr. Gladis Louder: Normal.  No polyps.  PMH: Atrial fibrillation, atrial flutter, hypertension, asthma, hypothyroidism, SIADH.  Currently  on Eliquis .  Macular degeneration, ADHD.  12/2022 echo LVEF 55 to 60%.  Underwent cardioversion 12/2022  02/2023 cardiac chest CT showed incidental large hiatal hernia.  Past Medical History:  Diagnosis Date   Asthma    Atrial fibrillation (HCC)    BPH (benign prostatic hyperplasia)    Cancer (HCC)    hx of skin cancer    Degenerative arthritis of cervical spine    c5-c6   Dysrhythmia    hx of pvcs in the past    Family history of adverse reaction to anesthesia    Father had idosyncratic narcotic  reaction   GERD (gastroesophageal reflux disease)    occ    Hyperlipidemia    Hypertension    Hypothyroidism    Osteopenia    Raynaud disease    Renal disease    stage 3, patient not aware of this diagnosis   Thyroid  disease     Past Surgical History:  Procedure Laterality Date   A-FLUTTER ABLATION N/A 09/19/2022   Procedure: A-FLUTTER ABLATION;  Surgeon: Mealor, Eulas BRAVO, MD;  Location: [facility];  Service: Cardiovascular;  Laterality: N/A;   ATRIAL FIBRILLATION ABLATION N/A 09/19/2022   Procedure: ATRIAL FIBRILLATION ABLATION;  Surgeon: Nancey Eulas BRAVO, MD;  Location: [facility];  Service: Cardiovascular;  Laterality: N/A;   CARDIOVERSION N/A 05/23/2022   Procedure: CARDIOVERSION;  Surgeon: Santo Stanly LABOR, MD;  Location: [facility];  Service: Cardiovascular;  Laterality: N/A;   CARDIOVERSION N/A 11/08/2022   Procedure: CARDIOVERSION;  Surgeon: Pietro,  Redell RAMAN, MD;  Location: [facility];  Service: Cardiovascular;  Laterality: N/A;   CARDIOVERSION N/A 12/13/2022   Procedure: CARDIOVERSION;  Surgeon: Alvan Ronal BRAVO, MD;  Location: [facility];  Service: Cardiovascular;  Laterality: N/A;   CARDIOVERSION N/A 01/08/2023   Procedure: CARDIOVERSION;  Surgeon: Loni Soyla LABOR, MD;  Location: [facility];  Service: Cardiovascular;  Laterality: N/A;   COLONOSCOPY WITH PROPOFOL  N/A 01/10/2016   Procedure: COLONOSCOPY WITH PROPOFOL ;  Surgeon:  Gladis MARLA Louder, MD;  Location: [facility];  Service: Endoscopy;  Laterality: N/A;   HEMORROIDECTOMY     INGUINAL HERNIA REPAIR Bilateral 12/01/2019   Procedure: LAPAROSCOPIC BILATERAL INGUINAL HERNIA REPAIR WITH MESH,UMBILICAL HERNIA REPAIR;  Surgeon: Kinsinger, Herlene Righter, MD;  Location: [facility];  Service: General;  Laterality: Bilateral;   PAROTIDECTOMY  2017   Dr. Carlie   PROSTATECTOMY      Prior to Admission medications   Medication Sig Start Date End Date Taking? Authorizing Provider  albuterol (PROVENTIL HFA;VENTOLIN HFA) 108 (90 BASE) MCG/ACT inhaler Inhale 2 puffs into the lungs every 6 (six) hours as needed for wheezing or shortness of breath. Inhale 2 puffs every morning, may inhale 2 puffs every 6 hours as needed for shortness of breath    [provider]  alendronate (FOSAMAX) 70 MG tablet Take 70 mg by mouth every Saturday. 10/23/21   [provider]  amLODipine  (NORVASC ) 5 MG tablet Take 5 mg by mouth daily.    [provider]  apixaban  (ELIQUIS ) 5 MG TABS tablet Take 1 tablet (5 mg total) by mouth 2 (two) times daily. 11/28/21   Lue Elsie BROCKS, MD  Bioflavonoid Products (ESTER C PO) Take 500 mg by mouth in the morning and at bedtime.    [provider]  buPROPion  (WELLBUTRIN  XL) 300 MG 24 hr tablet Take 300 mg by mouth daily. 11/30/15   [provider]  Cholecalciferol (VITAMIN D3) 5000 units CAPS Take 5,000 Units by mouth in the morning.    [provider]  Cinnamon 500 MG TABS Take 500 mg by mouth every evening. Advanced Strength Cinsulin    [provider]  Coenzyme Q10 300 MG CAPS Take 300 mg by mouth in the morning.    [provider]  Collagen-Boron-Hyaluronic Acid ([employer] JOINT HEALTH TRIPLE ACTION PO) Take 1 tablet by mouth in the morning.    [provider]  diltiazem  (CARDIZEM ) 30 MG tablet Take 1 tablet (30 mg total) by mouth 3 (three) times daily as needed (for palpitations or atrial  fib). 02/13/22   Acharya, Gayatri A, MD  GEMTESA 75 MG TABS Take 1 tablet by mouth daily.    [provider]  irbesartan  (AVAPRO ) 75 MG tablet Take 75 mg by mouth daily. Per patient, he is taking 75 mg Daily    [provider]  ketotifen (ZADITOR) 0.035 % ophthalmic solution Place 1 drop into both eyes daily as needed (allergies).    [provider]  levothyroxine  (SYNTHROID , LEVOTHROID) 200 MCG tablet Take 200 mcg by mouth daily before breakfast. 12/30/15   [provider]  Multiple Vitamins-Minerals (CENTRUM ULTRA MENS PO) Take 1 tablet by mouth daily. 50+    [provider]  Multiple Vitamins-Minerals (PRESERVISION AREDS 2 PO) Take 1 tablet by mouth in the morning and at bedtime.    [provider]  rosuvastatin  (CRESTOR ) 5 MG tablet Take 5 mg by mouth in the morning.    [provider]  sodium chloride  (OCEAN) 0.65 % SOLN nasal spray Place 1 spray into both nostrils daily as needed for congestion.    [provider]  tadalafil  (CIALIS ) 5 MG tablet Take 5 mg by mouth in the morning.    [provider]  TURMERIC CURCUMIN PO Take 750 mg by mouth every morning.    [provider]    Family History  Problem Relation Age of Onset   Colon polyps Brother    Autoimmune disease Daughter      Social History   Tobacco Use   Smoking status: Former    Current packs/day: 0.00    Average packs/day: 1 pack/day for 20.0 years (20.0 ttl pk-yrs)    Types: Cigarettes, Pipe    Start date: 76    Quit date: 1979    Years since quitting: 46.8   Smokeless tobacco: Never   Tobacco comments:    Former smoker 08/21/23  Vaping Use   Vaping status: Never Used  Substance Use Topics   Alcohol use: Yes    Alcohol/week: 7.0 standard drinks of alcohol    Types: 7 Glasses of wine per week    Comment: 1 glass of wine nightly 08/21/23   Drug use: No    Allergies as of 04/03/2024 - Review Complete 04/03/2024  Allergen  Reaction Noted   Lisinopril Cough 11/27/2021   Tape Other (See Comments) 09/05/2011    Review of Systems:    All systems reviewed and negative except where noted in HPI.   Physical Exam:  BP (!) 108/58   Pulse 64   Ht 5' 11 (1.803 m)   Wt 150 lb (68 kg)   SpO2 99%   BMI 20.92 kg/m  No LMP for male patient.  General:   Alert,  Well-developed, well-nourished, pleasant and cooperative in NAD Lungs:  Respirations even and unlabored.  Clear throughout to auscultation.   No wheezes, crackles, or rhonchi. No acute distress. Heart:  Regular rate and rhythm; no murmurs, clicks, rubs, or gallops. Abdomen:  Normal bowel sounds.  No bruits.  Soft, and non-distended without masses, hepatosplenomegaly or hernias noted.  No Tenderness.  No guarding or rebound tenderness.    Neurologic:  Alert and oriented x3;  grossly normal neurologically. Psych:  Alert and cooperative. Normal mood and affect.  Imaging Studies: No results found.  Labs: CBC    Component Value Date/Time   WBC 4.9 02/20/2023 1233   WBC 5.6 11/28/2022 1930   RBC 4.26 02/20/2023 1233   RBC 4.24 11/28/2022 1930   HGB 14.0 02/20/2023 1233   HCT 41.9 02/20/2023 1233   PLT 252 02/20/2023 1233   MCV 98 (H) 02/20/2023 1233    CMP     Component Value Date/Time   NA 135 02/20/2023 1233   K 4.4 02/20/2023 1233   CL 96 02/20/2023 1233   CO2 22 02/20/2023 1233   GLUCOSE 100 (H) 02/20/2023 1233   GLUCOSE 105 (H) 12/14/2022 1053   BUN 19 02/20/2023 1233   CREATININE 1.11 02/20/2023 1233   CALCIUM  9.1 02/20/2023 1233   PROT 5.8 (L) 11/28/2022 1930   ALBUMIN 3.8 11/28/2022 1930   AST 33 11/28/2022 1930   ALT 33 11/28/2022 1930   ALKPHOS 47 11/28/2022 1930   BILITOT 1.1 11/28/2022 1930   GFRNONAA >60 12/14/2022 1053   GFRAA >60 11/28/2019 1202    Assessment and Plan:   Michael Bates is a 83 y.o. y/o male has been referred for:  Positive Cologuard Family history  of Colon Cancer: Maternal uncle Family history of  colon polyps Brother History of atrial fibrillation s/p ablation; currently on Eliquis   Plan: - Scheduling Colonoscopy (patient requested Dr. Leigh) I discussed risks of colonoscopy with patient to include risk of bleeding, colon perforation, and risk of sedation.  Patient expressed understanding and agrees to proceed with colonoscopy.  - Requesting permission from his cardiologist to hold Eliquis  2 days before colonoscopy. - Colon cancer screening guidelines were discussed at length.  I advised patient if this colonoscopy is negative, then no further colon cancer screening tests are recommended due to advanced age and increased risk of procedure.  Patient expressed understanding.  Follow up as needed based on colonoscopy results.  Ellouise Console, PA-C

## 2024-04-03 ENCOUNTER — Telehealth: Payer: Self-pay

## 2024-04-03 ENCOUNTER — Encounter: Payer: Self-pay | Admitting: Internal Medicine

## 2024-04-03 ENCOUNTER — Encounter: Payer: Self-pay | Admitting: Physician Assistant

## 2024-04-03 ENCOUNTER — Ambulatory Visit: Admitting: Physician Assistant

## 2024-04-03 VITALS — BP 108/58 | HR 64 | Ht 71.0 in | Wt 150.0 lb

## 2024-04-03 DIAGNOSIS — I480 Paroxysmal atrial fibrillation: Secondary | ICD-10-CM

## 2024-04-03 DIAGNOSIS — R195 Other fecal abnormalities: Secondary | ICD-10-CM | POA: Diagnosis not present

## 2024-04-03 DIAGNOSIS — Z7901 Long term (current) use of anticoagulants: Secondary | ICD-10-CM

## 2024-04-03 MED ORDER — NA SULFATE-K SULFATE-MG SULF 17.5-3.13-1.6 GM/177ML PO SOLN: 1.0000 | Freq: Once | ORAL | 0 refills | Status: AC

## 2024-04-03 NOTE — Telephone Encounter (Signed)
 [facility] Pre-operative Risk Assessment     Request for surgical clearance:     Endoscopy Procedure  What type of surgery is being performed?     Colonoscopy  When is this surgery scheduled?     05/07/24  What type of clearance is required ?   Pharmacy  Are there any medications that need to be held prior to surgery and how long? Eliquis  2 days  Practice name and name of physician performing surgery?      [facility]  What is your office phone and fax number?      Phone- [phone]  Fax- [phone]  Anesthesia type (None, local, MAC, general) ?       MAC   Please route your response to Alethea Blocker, CMA

## 2024-04-03 NOTE — Telephone Encounter (Signed)
 Called patient regarding labs patient stated he had these drawn by PCP back on July and is going to send a copy of his results through mychart if he can

## 2024-04-03 NOTE — Patient Instructions (Signed)
 You have been scheduled for a Colonoscopy. Please follow written instructions given to you at your visit today.   If you use inhalers (even only as needed), please bring them with you on the day of your procedure.  DO NOT TAKE 7 DAYS PRIOR TO TEST- Trulicity (dulaglutide) Ozempic, Wegovy (semaglutide) Mounjaro (tirzepatide) Bydureon Bcise (exanatide extended release)  DO NOT TAKE 1 DAY PRIOR TO YOUR TEST Rybelsus (semaglutide) Adlyxin (lixisenatide) Victoza (liraglutide) Byetta (exanatide) ___________________________________________________________________________  Please follow up sooner if symptoms increase or worsen   Due to recent changes in healthcare laws, you may see the results of your imaging and laboratory studies on MyChart before your provider has had a chance to review them.  We understand that in some cases there may be results that are confusing or concerning to you. Not all laboratory results come back in the same time frame and the provider may be waiting for multiple results in order to interpret others.  Please give us  48 hours in order for your provider to thoroughly review all the results before contacting the office for clarification of your results.   Thank you for trusting me with your gastrointestinal care!   Ellouise Console, PA-C _______________________________________________________  If your blood pressure at your visit was 140/90 or greater, please contact your primary care physician to follow up on this.  _______________________________________________________  If you are age 81 or older, your body mass index should be between 23-30. Your Body mass index is 20.92 kg/m. If this is out of the aforementioned range listed, please consider follow up with your Primary Care Provider.  If you are age 57 or younger, your body mass index should be between 19-25. Your Body mass index is 20.92 kg/m. If this is out of the aformentioned range listed, please consider  follow up with your Primary Care Provider.   ________________________________________________________  The [facility] providers would like to encourage you to use MYCHART to communicate with providers for non-urgent requests or questions.  Due to long hold times on the telephone, sending your provider a message by [facility] may be a faster and more efficient way to get a response.  Please allow 48 business hours for a response.  Please remember that this is for non-urgent requests.  _______________________________________________________

## 2024-04-03 NOTE — Progress Notes (Signed)
 Agree with assessment and plan as outlined.

## 2024-04-04 ENCOUNTER — Encounter: Payer: Self-pay | Admitting: Gastroenterology

## 2024-04-08 NOTE — Telephone Encounter (Addendum)
 I will send this back to preop APP for the pharm-d to review the labs for blood thinner recommendations.   Pt has appt 04/17/24 with Dr. Loni MODEL, Ryan CROME DOB: Feb 24, 1941 (83 yo M) Acc No. [id number]   Derral, Colucci, [id number]: [id number] S  [facility] [address], [place], [place] [phone]   BMP: 01/09/24  BUN 20 CREATININE 0.87 K+ 4.3  CBC 01/09/24 WBC 5.0 HGB 13.1 PLT 246

## 2024-04-08 NOTE — Telephone Encounter (Signed)
 Full list of the labs can be seen in the chart 04/03/24 my message , see attachments

## 2024-04-10 NOTE — Telephone Encounter (Signed)
 Patient with diagnosis of Atrial fibrillation on Eliquis  for anticoagulation.    What type of surgery is being performed?     Colonoscopy  When is this surgery scheduled?     05/07/24    CHA2DS2-VASc Score = 4   This indicates a 4.8% annual risk of stroke. The patient's score is based upon: CHF History: 0 HTN History: 1 Diabetes History: 0 Stroke History: 0 Vascular Disease History: 1 (aoritc atherosclerosis) Age Score: 2 Gender Score: 0     CrCl 69 ml/min Platelet count 246 K  Patient has not had an Afib/aflutter ablation within the last 3 months or DCCV within the last 30 days  Per office protocol, patient can hold Eliquis  for 2 days prior to procedure.    Patient will not need bridging with Lovenox (enoxaparin) around procedure.  **This guidance is not considered finalized until pre-operative APP has relayed final recommendations.**

## 2024-04-15 DIAGNOSIS — M170 Bilateral primary osteoarthritis of knee: Secondary | ICD-10-CM | POA: Diagnosis not present

## 2024-04-16 DIAGNOSIS — Z08 Encounter for follow-up examination after completed treatment for malignant neoplasm: Secondary | ICD-10-CM | POA: Diagnosis not present

## 2024-04-16 DIAGNOSIS — C44629 Squamous cell carcinoma of skin of left upper limb, including shoulder: Secondary | ICD-10-CM | POA: Diagnosis not present

## 2024-04-16 DIAGNOSIS — L814 Other melanin hyperpigmentation: Secondary | ICD-10-CM | POA: Diagnosis not present

## 2024-04-16 DIAGNOSIS — L821 Other seborrheic keratosis: Secondary | ICD-10-CM | POA: Diagnosis not present

## 2024-04-16 DIAGNOSIS — C44622 Squamous cell carcinoma of skin of right upper limb, including shoulder: Secondary | ICD-10-CM | POA: Diagnosis not present

## 2024-04-16 DIAGNOSIS — Z85828 Personal history of other malignant neoplasm of skin: Secondary | ICD-10-CM | POA: Diagnosis not present

## 2024-04-16 DIAGNOSIS — D1801 Hemangioma of skin and subcutaneous tissue: Secondary | ICD-10-CM | POA: Diagnosis not present

## 2024-04-16 DIAGNOSIS — C44329 Squamous cell carcinoma of skin of other parts of face: Secondary | ICD-10-CM | POA: Diagnosis not present

## 2024-04-16 DIAGNOSIS — C44222 Squamous cell carcinoma of skin of right ear and external auricular canal: Secondary | ICD-10-CM | POA: Diagnosis not present

## 2024-04-17 ENCOUNTER — Ambulatory Visit: Attending: Internal Medicine | Admitting: Internal Medicine

## 2024-04-17 VITALS — BP 124/65 | HR 61 | Ht 71.0 in | Wt 150.5 lb

## 2024-04-17 DIAGNOSIS — D6869 Other thrombophilia: Secondary | ICD-10-CM

## 2024-04-17 DIAGNOSIS — I10 Essential (primary) hypertension: Secondary | ICD-10-CM

## 2024-04-17 DIAGNOSIS — I4819 Other persistent atrial fibrillation: Secondary | ICD-10-CM

## 2024-04-17 DIAGNOSIS — E785 Hyperlipidemia, unspecified: Secondary | ICD-10-CM | POA: Diagnosis not present

## 2024-04-17 DIAGNOSIS — I484 Atypical atrial flutter: Secondary | ICD-10-CM | POA: Diagnosis not present

## 2024-04-17 DIAGNOSIS — I2510 Atherosclerotic heart disease of native coronary artery without angina pectoris: Secondary | ICD-10-CM | POA: Diagnosis not present

## 2024-04-17 NOTE — Patient Instructions (Signed)
 Medication Instructions:  None  Follow-Up: At [facility], you and your health needs are our priority.  As part of our continuing mission to provide you with exceptional heart care, our providers are all part of one team.  This team includes your primary Cardiologist (physician) and Advanced Practice Providers or APPs (Physician Assistants and Nurse Practitioners) who all work together to provide you with the care you need, when you need it.  Your next appointment:   1 year(s) We will mail a reminder letter around August 2026; please call for an appointment due October 2026  Provider:   Soyla DELENA Merck, MD    Other Instructions Please call us  or send a MyChart message with any Cardiology related questions/concerns.  [phone].  Thank you!

## 2024-04-17 NOTE — Progress Notes (Signed)
 Cardiology Office Note:  .   Date:  04/17/2024  ID:  Michael Bates Model, DOB 1941/04/14, MRN [id number] PCP: Dwight Trula SQUIBB, MD  [facility] Providers Cardiologist:  Soyla DELENA Merck, MD Electrophysiologist:  Eulas FORBES Furbish, MD    History of Present Illness: .   Michael Bates is a 83 y.o. male.  Discussed the use of AI scribe software for clinical note transcription with the patient, who gave verbal consent to proceed.  History of Present Illness Michael Bates is an 83 year old male with atrial fibrillation who presents for follow-up after ablation.  He underwent pulmonary vein isolation and CTI at [facility] for recurrent atrial fibrillation. He has not experienced any recurrence of atrial fibrillation but does have infrequent premature atrial contractions, which are not bothersome. He uses a KardiaMobile to monitor his heart rhythm.  Post-ablation, he experienced volume overload, treated with Lasix  and spironolactone, which he no longer takes. He occasionally experiences fluid retention, attributing it to excessive water intake. His feet and ankles are less swollen in the morning, and he has nocturia more than expected for his fluid intake.  Current medications include Eliquis  5 mg twice daily, irbesartan  75 mg daily, Crestor  5 mg daily, and diltiazem  30 mg as needed, which he has not used since the last ablation. He has reduced fish oil intake due to bleeding risk concerns with Eliquis . No chest discomfort or significant shortness of breath is reported, though he uses albuterol before walking for asthma.    ROS: negative except per HPI above.  Studies Reviewed: .        Results EKG: Normal Risk Assessment/Calculations:    CHA2DS2-VASc Score = 4   This indicates a 4.8% annual risk of stroke. The patient's score is based upon: CHF History: 0 HTN History: 1 Diabetes History: 0 Stroke History: 0 Vascular Disease History: 1 (aoritc atherosclerosis) Age Score:  2 Gender Score: 0      Physical Exam:   VS:  BP 124/65   Pulse 61   Ht 5' 11 (1.803 m)   Wt 150 lb 8 oz (68.3 kg)   SpO2 98%   BMI 20.99 kg/m    Wt Readings from Last 3 Encounters:  04/17/24 150 lb 8 oz (68.3 kg)  04/03/24 150 lb (68 kg)  01/14/24 146 lb 6.4 oz (66.4 kg)     Physical Exam GENERAL: Alert, cooperative, well developed, no acute distress HEENT: Normocephalic, normal oropharynx, moist mucous membranes CHEST: Clear to auscultation bilaterally, no wheezes, rhonchi, or crackles CARDIOVASCULAR: Normal heart rate and rhythm, S1 and S2 normal without murmurs ABDOMEN: Soft, non-tender, non-distended, without organomegaly, normal bowel sounds EXTREMITIES: No cyanosis or edema NEUROLOGICAL: Cranial nerves grossly intact, moves all extremities without gross motor or sensory deficit   ASSESSMENT AND PLAN: .    Assessment and Plan Assessment & Plan Atrial fibrillation and atypical atrial flutter, status post ablation, on anticoagulation Status post pulmonary vein isolation and CTI ablation with no significant recurrence. Infrequent PACs noted. Continues apixaban  5 mg BID without bleeding issues. Scheduled for colonoscopy on November 19th. - ok to hold apixaban  for two days prior to colonoscopy on November 19th if needed from GI. - Forward note to preop team regarding anticoagulation management for colonoscopy.  Secondary hypercoagulable state Continues on apixaban  5 mg BID. No new thromboembolic events.  Hypertension Managed with irbesartan  75 mg daily and resumed amlodipine  5 mg daily. Reports nocturia and mild morning edema.  Hyperlipidemia Managed with rosuvastatin  5 mg  daily. Recent cholesterol levels satisfactory.  Coronary artery calcification No new symptoms. Continues on current cardiac medications.  Asthma Uses albuterol as needed before walks. No changes in asthma management.       Soyla Merck, MD, FACC

## 2024-04-22 DIAGNOSIS — M170 Bilateral primary osteoarthritis of knee: Secondary | ICD-10-CM | POA: Diagnosis not present

## 2024-04-25 DIAGNOSIS — E039 Hypothyroidism, unspecified: Secondary | ICD-10-CM | POA: Diagnosis not present

## 2024-04-29 DIAGNOSIS — M170 Bilateral primary osteoarthritis of knee: Secondary | ICD-10-CM | POA: Diagnosis not present

## 2024-05-01 NOTE — Telephone Encounter (Signed)
 Please confirm date he needs to hold his Eliquis  for his procedure

## 2024-05-07 ENCOUNTER — Ambulatory Visit (AMBULATORY_SURGERY_CENTER): Admitting: Gastroenterology

## 2024-05-07 ENCOUNTER — Encounter: Payer: Self-pay | Admitting: Gastroenterology

## 2024-05-07 VITALS — BP 134/71 | HR 56 | Temp 97.7°F | Resp 10 | Ht 71.0 in | Wt 150.0 lb

## 2024-05-07 DIAGNOSIS — D122 Benign neoplasm of ascending colon: Secondary | ICD-10-CM

## 2024-05-07 DIAGNOSIS — I10 Essential (primary) hypertension: Secondary | ICD-10-CM | POA: Diagnosis not present

## 2024-05-07 DIAGNOSIS — Z1211 Encounter for screening for malignant neoplasm of colon: Secondary | ICD-10-CM | POA: Diagnosis not present

## 2024-05-07 DIAGNOSIS — D120 Benign neoplasm of cecum: Secondary | ICD-10-CM | POA: Diagnosis not present

## 2024-05-07 DIAGNOSIS — R195 Other fecal abnormalities: Secondary | ICD-10-CM | POA: Diagnosis not present

## 2024-05-07 DIAGNOSIS — D123 Benign neoplasm of transverse colon: Secondary | ICD-10-CM | POA: Diagnosis not present

## 2024-05-07 DIAGNOSIS — K648 Other hemorrhoids: Secondary | ICD-10-CM

## 2024-05-07 DIAGNOSIS — K5730 Diverticulosis of large intestine without perforation or abscess without bleeding: Secondary | ICD-10-CM | POA: Diagnosis not present

## 2024-05-07 DIAGNOSIS — J45909 Unspecified asthma, uncomplicated: Secondary | ICD-10-CM | POA: Diagnosis not present

## 2024-05-07 DIAGNOSIS — E785 Hyperlipidemia, unspecified: Secondary | ICD-10-CM | POA: Diagnosis not present

## 2024-05-07 DIAGNOSIS — I4891 Unspecified atrial fibrillation: Secondary | ICD-10-CM | POA: Diagnosis not present

## 2024-05-07 MED ORDER — SODIUM CHLORIDE 0.9 % IV SOLN: 500.0000 mL | INTRAVENOUS | Status: DC

## 2024-05-07 NOTE — Progress Notes (Signed)
 Called to room to assist during endoscopic procedure.  Patient ID and intended procedure confirmed with present staff. Received instructions for my participation in the procedure from the performing physician.

## 2024-05-07 NOTE — Op Note (Signed)
 [facility] Patient Name: Michael Bates Procedure Date: 05/07/2024 2:28 PM MRN: [id number] Endoscopist: Elspeth P. Leigh , MD, [id number] Age: 83 Referring MD:  Date of Birth: September 15, 1940 Gender: Male Account #: [id number] Procedure:                Colonoscopy Indications:              Positive Cologuard test - last exam 2017 Medicines:                Monitored Anesthesia Care Procedure:                Pre-Anesthesia Assessment:                           - Prior to the procedure, a History and Physical                            was performed, and patient medications and                            allergies were reviewed. The patient's tolerance of                            previous anesthesia was also reviewed. The risks                            and benefits of the procedure and the sedation                            options and risks were discussed with the patient.                            All questions were answered, and informed consent                            was obtained. Prior Anticoagulants: The patient has                            taken Eliquis  (apixaban ), last dose was 2 days                            prior to procedure. ASA Grade Assessment: II - A                            patient with mild systemic disease. After reviewing                            the risks and benefits, the patient was deemed in                            satisfactory condition to undergo the procedure.                           After obtaining informed consent, the colonoscope  was passed under direct vision. Throughout the                            procedure, the patient's blood pressure, pulse, and                            oxygen saturations were monitored continuously. The                            Olympus Scope [id number] was introduced through the                            anus and advanced to the the terminal ileum, with                             identification of the appendiceal orifice and IC                            valve. The colonoscopy was performed without                            difficulty. The patient tolerated the procedure                            well. The quality of the bowel preparation was                            adequate. The terminal ileum, ileocecal valve,                            appendiceal orifice, and rectum were photographed. Scope In: 2:37:59 PM Scope Out: 3:17:47 PM Scope Withdrawal Time: 0 hours 28 minutes 25 seconds  Total Procedure Duration: 0 hours 39 minutes 48 seconds  Findings:                 The perianal and digital rectal examinations were                            normal.                           The terminal ileum appeared normal.                           Three sessile polyps were found in the cecum. The                            polyps were diminutive in size. These polyps were                            removed with a cold snare. Resection and retrieval                            were complete.  A 5 mm polyp was found in the ileocecal valve. The                            polyp was sessile. The polyp was removed with a                            cold snare. Resection and retrieval were complete.                           Two sessile polyps were found in the ascending                            colon. The polyps were 3 to 4 mm in size. These                            polyps were removed with a cold snare. Resection                            and retrieval were complete.                           A diminutive polyp was found in the transverse                            colon. The polyp was flat. The polyp was removed                            with a cold snare. Resection and retrieval were                            complete.                           Many diverticula were found in the left colon.                           Internal  hemorrhoids were found during retroflexion.                           The exam was otherwise without abnormality. Of                            note, significant seed burden in the right colon.                            This was lavaged extensively - during initial cecal                            intubation, the scope clogged numerous times                            requiring brushing and lavaging the colonoscope.  Unfortunately one one occasiona could not unclog                            it, and initial colonoscope needed to be completely                            removed and replaced with a different colonoscopy.                            Then again right colon needed to be cleared - this                            process was quite tedious and prolonged the exam                            but able to complete the exam. Complications:            No immediate complications. Estimated blood loss:                            Minimal. Estimated Blood Loss:     Estimated blood loss was minimal. Impression:               - The examined portion of the ileum was normal.                           - Three diminutive polyps in the cecum, removed                            with a cold snare. Resected and retrieved.                           - One 5 mm polyp at the ileocecal valve, removed                            with a cold snare. Resected and retrieved.                           - Two 3 to 4 mm polyps in the ascending colon,                            removed with a cold snare. Resected and retrieved.                           - One diminutive polyp in the transverse colon,                            removed with a cold snare. Resected and retrieved.                           - Diverticulosis in the left colon.                           -  Internal hemorrhoids.                           - Significant residual seeds in dependant portions                            of  the colon, worst in right colon, prolonging the                            exam and leading to change of colonoscope as                            outlined.                           - The examination was otherwise normal. Recommendation:           - Patient has a contact number available for                            emergencies. The signs and symptoms of potential                            delayed complications were discussed with the                            patient. Return to normal activities tomorrow.                            Written discharge instructions were provided to the                            patient.                           - Resume previous diet.                           - Continue present medications.                           - Resume Eliquis  tomorrow.                           - Await pathology results. Elspeth P. Alexas Basulto, MD 05/07/2024 3:27:19 PM This report has been signed electronically.

## 2024-05-07 NOTE — Progress Notes (Signed)
 Report given to PACU, vss

## 2024-05-07 NOTE — Patient Instructions (Signed)
 Handouts given on polyps and diverticulosis.  Resume Eliquis  tomorrow.  YOU HAD AN ENDOSCOPIC PROCEDURE TODAY AT THE [facility]:   Refer to the procedure report that was given to you for any specific questions about what was found during the examination.  If the procedure report does not answer your questions, please call your gastroenterologist to clarify.  If you requested that your care partner not be given the details of your procedure findings, then the procedure report has been included in a sealed envelope for you to review at your convenience later.  YOU SHOULD EXPECT: Some feelings of bloating in the abdomen. Passage of more gas than usual.  Walking can help get rid of the air that was put into your GI tract during the procedure and reduce the bloating. If you had a lower endoscopy (such as a colonoscopy or flexible sigmoidoscopy) you may notice spotting of blood in your stool or on the toilet paper. If you underwent a bowel prep for your procedure, you may not have a normal bowel movement for a few days.  Please Note:  You might notice some irritation and congestion in your nose or some drainage.  This is from the oxygen used during your procedure.  There is no need for concern and it should clear up in a day or so.  SYMPTOMS TO REPORT IMMEDIATELY:  Following lower endoscopy (colonoscopy or flexible sigmoidoscopy):  Excessive amounts of blood in the stool  Significant tenderness or worsening of abdominal pains  Swelling of the abdomen that is new, acute  Fever of 100F or higher  Following upper endoscopy (EGD)  Vomiting of blood or coffee ground material  New chest pain or pain under the shoulder blades  Painful or persistently difficult swallowing  New shortness of breath  Fever of 100F or higher  Black, tarry-looking stools  For urgent or emergent issues, a gastroenterologist can be reached at any hour by calling [phone]. Do not use MyChart messaging for  urgent concerns.    DIET:  We do recommend a small meal at first, but then you may proceed to your regular diet.  Drink plenty of fluids but you should avoid alcoholic beverages for 24 hours.  ACTIVITY:  You should plan to take it easy for the rest of today and you should NOT DRIVE or use heavy machinery until tomorrow (because of the sedation medicines used during the test).    FOLLOW UP: Our staff will call the number listed on your records the next business day following your procedure.  We will call around 7:15- 8:00 am to check on you and address any questions or concerns that you may have regarding the information given to you following your procedure. If we do not reach you, we will leave a message.     If any biopsies were taken you will be contacted by phone or by letter within the next 1-3 weeks.  Please call us  at [phone] if you have not heard about the biopsies in 3 weeks.    SIGNATURES/CONFIDENTIALITY: You and/or your care partner have signed paperwork which will be entered into your electronic medical record.  These signatures attest to the fact that that the information above on your After Visit Summary has been reviewed and is understood.  Full responsibility of the confidentiality of this discharge information lies with you and/or your care-partner.

## 2024-05-07 NOTE — Progress Notes (Signed)
 [facility] History and Physical   Primary Care Physician:  Michael Trula SQUIBB, MD   Reason for Procedure:   Positive Cologuard  Plan:    colonoscopy     HPI: Michael Bates is Bates 83 y.o. male  here for colonoscopy - positive Cologuard in August. Last exam 2017 Dr. Vicci.   . Patient denies any bowel symptoms at this time. No family history of colon cancer known. Otherwise feels well without any cardiopulmonary symptoms. Has held Eliquis  for 2 days prior to this exam.   I have discussed risks / benefits of anesthesia and endoscopic procedure with Michael Bates Model and they wish to proceed with the exams as outlined today.   The patient was provided an opportunity to ask questions and all were answered. The patient agreed with the plan.    Past Medical History:  Diagnosis Date   Asthma    Atrial fibrillation (HCC)    BPH (benign prostatic hyperplasia)    Cancer (HCC)    hx of skin cancer    Degenerative arthritis of cervical spine    c5-c6   Dysrhythmia    hx of pvcs in the past    Family history of adverse reaction to anesthesia    Father had idosyncratic narcotic  reaction   GERD (gastroesophageal reflux disease)    occ    Hyperlipidemia    Hypertension    Hypothyroidism    Osteopenia    Raynaud disease    Renal disease    stage 3, patient not aware of this diagnosis   Thyroid  disease     Past Surgical History:  Procedure Laterality Date   Bates-FLUTTER ABLATION N/Bates 09/19/2022   Procedure: Bates-FLUTTER ABLATION;  Surgeon: Michael Bates, Michael BRAVO, MD;  Location: [facility];  Service: Cardiovascular;  Laterality: N/Bates;   ATRIAL FIBRILLATION ABLATION N/Bates 09/19/2022   Procedure: ATRIAL FIBRILLATION ABLATION;  Surgeon: Michael Michael BRAVO, MD;  Location: [facility];  Service: Cardiovascular;  Laterality: N/Bates;   CARDIOVERSION N/Bates 05/23/2022   Procedure: CARDIOVERSION;  Surgeon: Michael Stanly LABOR, MD;  Location: [facility];  Service: Cardiovascular;   Laterality: N/Bates;   CARDIOVERSION N/Bates 11/08/2022   Procedure: CARDIOVERSION;  Surgeon: Michael Redell RAMAN, MD;  Location: [facility];  Service: Cardiovascular;  Laterality: N/Bates;   CARDIOVERSION N/Bates 12/13/2022   Procedure: CARDIOVERSION;  Surgeon: Michael Ronal BRAVO, MD;  Location: [facility];  Service: Cardiovascular;  Laterality: N/Bates;   CARDIOVERSION N/Bates 01/08/2023   Procedure: CARDIOVERSION;  Surgeon: Michael Soyla LABOR, MD;  Location: [facility];  Service: Cardiovascular;  Laterality: N/Bates;   COLONOSCOPY WITH PROPOFOL  N/Bates 01/10/2016   Procedure: COLONOSCOPY WITH PROPOFOL ;  Surgeon: Michael MARLA Vicci, MD;  Location: [facility];  Service: Endoscopy;  Laterality: N/Bates;   HEMORROIDECTOMY     INGUINAL HERNIA REPAIR Bilateral 12/01/2019   Procedure: LAPAROSCOPIC BILATERAL INGUINAL HERNIA REPAIR WITH MESH,UMBILICAL HERNIA REPAIR;  Surgeon: Bates, Herlene Righter, MD;  Location: [facility];  Service: General;  Laterality: Bilateral;   PAROTIDECTOMY  2017   Michael Bates   PROSTATECTOMY      Prior to Admission medications   Medication Sig Start Date End Date Taking? Authorizing Provider  albuterol (PROVENTIL HFA;VENTOLIN HFA) 108 (90 BASE) MCG/ACT inhaler Inhale 2 puffs into the lungs every 6 (six) hours as needed for wheezing or shortness of breath. Inhale 2 puffs every morning, may inhale 2 puffs every 6 hours as needed for shortness of breath   Yes  [provider]  alendronate (FOSAMAX) 70 MG tablet Take 70 mg by mouth every Saturday. 10/23/21  Yes [provider]  amLODipine  (NORVASC ) 5 MG tablet Take 5 mg by mouth daily.   Yes [provider]  Bioflavonoid Products (ESTER C PO) Take 500 mg by mouth in the morning and at bedtime.   Yes [provider]  buPROPion  (WELLBUTRIN  XL) 300 MG 24 hr tablet Take 300 mg by mouth daily. 11/30/15  Yes [provider]  Cholecalciferol (VITAMIN D3) 5000 units CAPS Take 5,000 Units by mouth in the morning.   Yes  [provider]  Cinnamon 500 MG TABS Take 500 mg by mouth every evening. Advanced Strength Cinsulin   Yes [provider]  Coenzyme Q10 300 MG CAPS Take 300 mg by mouth in the morning.   Yes [provider]  Collagen-Boron-Hyaluronic Acid ([employer] JOINT HEALTH TRIPLE ACTION PO) Take 1 tablet by mouth in the morning.   Yes [provider]  GEMTESA 75 MG TABS Take 1 tablet by mouth daily.   Yes [provider]  irbesartan  (AVAPRO ) 75 MG tablet Take 75 mg by mouth daily. Per patient, he is taking 75 mg Daily   Yes [provider]  levothyroxine  (SYNTHROID ) 150 MCG tablet Take 150 mcg by mouth daily before breakfast. 03/15/24  Yes [provider]  methylphenidate 18 MG PO CR tablet Take 18 mg by mouth every morning. 03/10/24  Yes [provider]  Multiple Vitamins-Minerals (CENTRUM ULTRA MENS PO) Take 1 tablet by mouth daily. 50+   Yes [provider]  Multiple Vitamins-Minerals (PRESERVISION AREDS 2 PO) Take 1 tablet by mouth in the morning and at bedtime.   Yes [provider]  QVAR REDIHALER 80 MCG/ACT inhaler Inhale into the lungs. 05/03/24  Yes [provider]  rosuvastatin  (CRESTOR ) 5 MG tablet Take 5 mg by mouth in the morning.   Yes [provider]  tadalafil  (CIALIS ) 5 MG tablet Take 5 mg by mouth in the morning.   Yes [provider]  TURMERIC CURCUMIN PO Take 750 mg by mouth every morning.   Yes [provider]  apixaban  (ELIQUIS ) 5 MG TABS tablet Take 1 tablet (5 mg total) by mouth 2 (two) times daily. 11/28/21   Lue Elsie BROCKS, MD  diltiazem  (CARDIZEM ) 30 MG tablet Take 1 tablet (30 mg total) by mouth 3 (three) times daily as needed (for palpitations or atrial fib). 02/13/22   Michael Bates, Michael A, MD  ketotifen (ZADITOR) 0.035 % ophthalmic solution Place 1 drop into both eyes daily as needed (allergies).    [provider]  sodium chloride  (OCEAN) 0.65 % SOLN  nasal spray Place 1 spray into both nostrils daily as needed for congestion.    [provider]    Current Outpatient Medications  Medication Sig Dispense Refill   albuterol (PROVENTIL HFA;VENTOLIN HFA) 108 (90 BASE) MCG/ACT inhaler Inhale 2 puffs into the lungs every 6 (six) hours as needed for wheezing or shortness of breath. Inhale 2 puffs every morning, may inhale 2 puffs every 6 hours as needed for shortness of breath     alendronate (FOSAMAX) 70 MG tablet Take 70 mg by mouth every Saturday.     amLODipine  (NORVASC ) 5 MG tablet Take 5 mg by mouth daily.     Bioflavonoid Products (ESTER C PO) Take 500 mg by mouth in the morning and at bedtime.     buPROPion  (WELLBUTRIN  XL) 300 MG 24 hr tablet Take  300 mg by mouth daily.     Cholecalciferol (VITAMIN D3) 5000 units CAPS Take 5,000 Units by mouth in the morning.     Cinnamon 500 MG TABS Take 500 mg by mouth every evening. Advanced Strength Cinsulin     Coenzyme Q10 300 MG CAPS Take 300 mg by mouth in the morning.     Collagen-Boron-Hyaluronic Acid ([employer] JOINT HEALTH TRIPLE ACTION PO) Take 1 tablet by mouth in the morning.     GEMTESA 75 MG TABS Take 1 tablet by mouth daily.     irbesartan  (AVAPRO ) 75 MG tablet Take 75 mg by mouth daily. Per patient, he is taking 75 mg Daily     levothyroxine  (SYNTHROID ) 150 MCG tablet Take 150 mcg by mouth daily before breakfast.     methylphenidate 18 MG PO CR tablet Take 18 mg by mouth every morning.     Multiple Vitamins-Minerals (CENTRUM ULTRA MENS PO) Take 1 tablet by mouth daily. 50+     Multiple Vitamins-Minerals (PRESERVISION AREDS 2 PO) Take 1 tablet by mouth in the morning and at bedtime.     QVAR REDIHALER 80 MCG/ACT inhaler Inhale into the lungs.     rosuvastatin  (CRESTOR ) 5 MG tablet Take 5 mg by mouth in the morning.     tadalafil  (CIALIS ) 5 MG tablet Take 5 mg by mouth in the morning.     TURMERIC CURCUMIN PO Take 750 mg by mouth every morning.     apixaban  (ELIQUIS ) 5 MG TABS tablet  Take 1 tablet (5 mg total) by mouth 2 (two) times daily. 60 tablet 1   diltiazem  (CARDIZEM ) 30 MG tablet Take 1 tablet (30 mg total) by mouth 3 (three) times daily as needed (for palpitations or atrial fib). 90 tablet 3   ketotifen (ZADITOR) 0.035 % ophthalmic solution Place 1 drop into both eyes daily as needed (allergies).     sodium chloride  (OCEAN) 0.65 % SOLN nasal spray Place 1 spray into both nostrils daily as needed for congestion.     Current Facility-Administered Medications  Medication Dose Route Frequency Provider Last Rate Last Admin   0.9 %  sodium chloride  infusion  500 mL Intravenous Continuous Darrion Macaulay, Elspeth SQUIBB, MD        Allergies as of 05/07/2024 - Review Complete 05/07/2024  Allergen Reaction Noted   Lisinopril Cough 11/27/2021   Tape Other (See Comments) 09/05/2011    Family History  Problem Relation Age of Onset   Colon polyps Brother    Colon cancer Maternal Uncle    Autoimmune disease Daughter    Rectal cancer Neg Hx    Stomach cancer Neg Hx    Esophageal cancer Neg Hx     Social History   Socioeconomic History   Marital status: Married    Spouse name: Not on file   Number of children: 3   Years of education: 20   Highest education level: Not on file  Occupational History   Not on file  Tobacco Use   Smoking status: Former    Current packs/day: 0.00    Average packs/day: 1 pack/day for 20.0 years (20.0 ttl pk-yrs)    Types: Cigarettes, Pipe    Start date: 67    Quit date: 1979    Years since quitting: 46.9   Smokeless tobacco: Never   Tobacco comments:    Former smoker 08/21/23  Vaping Use   Vaping status: Never Used  Substance and Sexual Activity   Alcohol use: Yes    Alcohol/week:  7.0 standard drinks of alcohol    Types: 7 Glasses of wine per week    Comment: 1 glass of wine nightly 08/21/23   Drug use: No   Sexual activity: Yes  Other Topics Concern   Not on file  Social History Narrative   Not on file   Social Drivers of  Health   Financial Resource Strain: Not on file  Food Insecurity: Not on file  Transportation Needs: No Transportation Needs (02/26/2019)   PRAPARE - Administrator, Civil Service (Medical): No    Lack of Transportation (Non-Medical): No  Physical Activity: Not on file  Stress: Not on file  Social Connections: Not on file  Intimate Partner Violence: Not At Risk (02/26/2019)   Humiliation, Afraid, Rape, and Kick questionnaire    Fear of Current or Ex-Partner: No    Emotionally Abused: No    Physically Abused: No    Sexually Abused: No    Review of Systems: All other review of systems negative except as mentioned in the HPI.  Physical Exam: Vital signs BP (!) 123/54   Pulse 62   Temp 97.7 F (36.5 C) (Temporal)   Ht 5' 11 (1.803 m)   Wt 150 lb (68 kg)   SpO2 94%   BMI 20.92 kg/m   General:   Alert,  Well-developed, pleasant and cooperative in NAD Lungs:  Clear throughout to auscultation.   Heart:  Regular rate and rhythm Abdomen:  Soft, nontender and nondistended.   Neuro/Psych:  Alert and cooperative. Normal mood and affect. Bates and O x 3  Marcey Naval, MD [facility]

## 2024-05-08 ENCOUNTER — Telehealth: Payer: Self-pay

## 2024-05-08 NOTE — Telephone Encounter (Signed)
 No answer on follow up call - voice mail message left

## 2024-05-09 DIAGNOSIS — M170 Bilateral primary osteoarthritis of knee: Secondary | ICD-10-CM | POA: Diagnosis not present

## 2024-05-12 LAB — SURGICAL PATHOLOGY

## 2024-05-17 ENCOUNTER — Ambulatory Visit: Payer: Self-pay | Admitting: Gastroenterology

## 2024-08-25 ENCOUNTER — Other Ambulatory Visit (HOSPITAL_BASED_OUTPATIENT_CLINIC_OR_DEPARTMENT_OTHER)
